# Patient Record
Sex: Male | Born: 1950 | Race: White | Hispanic: No | Marital: Married | Smoking: Never smoker
Health system: Southern US, Community
[De-identification: ages and names within clinical notes are randomized; demographics above are authoritative.]

## PROBLEM LIST (undated history)

## (undated) DIAGNOSIS — T50905A Adverse effect of unspecified drugs, medicaments and biological substances, initial encounter: Secondary | ICD-10-CM

## (undated) DIAGNOSIS — K219 Gastro-esophageal reflux disease without esophagitis: Secondary | ICD-10-CM

## (undated) DIAGNOSIS — R001 Bradycardia, unspecified: Secondary | ICD-10-CM

## (undated) DIAGNOSIS — M545 Low back pain, unspecified: Secondary | ICD-10-CM

## (undated) DIAGNOSIS — I6529 Occlusion and stenosis of unspecified carotid artery: Secondary | ICD-10-CM

## (undated) DIAGNOSIS — C44300 Unspecified malignant neoplasm of skin of unspecified part of face: Secondary | ICD-10-CM

## (undated) DIAGNOSIS — Z9289 Personal history of other medical treatment: Secondary | ICD-10-CM

## (undated) DIAGNOSIS — H9192 Unspecified hearing loss, left ear: Secondary | ICD-10-CM

## (undated) DIAGNOSIS — R600 Localized edema: Secondary | ICD-10-CM

## (undated) DIAGNOSIS — I2510 Atherosclerotic heart disease of native coronary artery without angina pectoris: Secondary | ICD-10-CM

## (undated) DIAGNOSIS — E785 Hyperlipidemia, unspecified: Secondary | ICD-10-CM

## (undated) DIAGNOSIS — D62 Acute posthemorrhagic anemia: Secondary | ICD-10-CM

## (undated) HISTORY — DX: Adverse effect of unspecified drugs, medicaments and biological substances, initial encounter: T50.905A

## (undated) HISTORY — PX: REFRACTIVE SURGERY: SHX103

## (undated) HISTORY — DX: Low back pain: M54.5

## (undated) HISTORY — DX: Gastro-esophageal reflux disease without esophagitis: K21.9

## (undated) HISTORY — DX: Bradycardia, unspecified: R00.1

## (undated) HISTORY — PX: CORONARY ANGIOPLASTY WITH STENT PLACEMENT: SHX49

## (undated) HISTORY — DX: Atherosclerotic heart disease of native coronary artery without angina pectoris: I25.10

## (undated) HISTORY — PX: TONSILLECTOMY AND ADENOIDECTOMY: SUR1326

## (undated) HISTORY — DX: Occlusion and stenosis of unspecified carotid artery: I65.29

## (undated) HISTORY — DX: Hyperlipidemia, unspecified: E78.5

## (undated) HISTORY — PX: KNEE ARTHROSCOPY: SHX127

## (undated) HISTORY — DX: Localized edema: R60.0

## (undated) HISTORY — PX: SKIN CANCER EXCISION: SHX779

## (undated) HISTORY — DX: Low back pain, unspecified: M54.50

## (undated) HISTORY — DX: Acute posthemorrhagic anemia: D62

---
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
## 1979-04-08 HISTORY — PX: KNEE ARTHROSCOPY: SHX127

## 1999-08-08 DIAGNOSIS — I2510 Atherosclerotic heart disease of native coronary artery without angina pectoris: Secondary | ICD-10-CM

## 1999-08-08 HISTORY — DX: Atherosclerotic heart disease of native coronary artery without angina pectoris: I25.10

## 2000-06-11 ENCOUNTER — Inpatient Hospital Stay (HOSPITAL_COMMUNITY): Admission: EM | Admit: 2000-06-11 | Discharge: 2000-06-13 | Payer: Self-pay | Admitting: Emergency Medicine

## 2001-11-01 ENCOUNTER — Encounter: Admission: RE | Admit: 2001-11-01 | Discharge: 2001-11-01 | Payer: Self-pay | Admitting: Family Medicine

## 2001-11-01 ENCOUNTER — Encounter: Payer: Self-pay | Admitting: Family Medicine

## 2003-06-26 ENCOUNTER — Encounter: Admission: RE | Admit: 2003-06-26 | Discharge: 2003-06-26 | Payer: Self-pay | Admitting: Family Medicine

## 2005-01-08 ENCOUNTER — Emergency Department (HOSPITAL_COMMUNITY): Admission: EM | Admit: 2005-01-08 | Discharge: 2005-01-08 | Payer: Self-pay | Admitting: Emergency Medicine

## 2006-12-14 ENCOUNTER — Encounter: Admission: RE | Admit: 2006-12-14 | Discharge: 2006-12-14 | Payer: Self-pay | Admitting: Neurosurgery

## 2010-12-23 NOTE — H&P (Signed)
[place]  Patient:    Michael Herrera, Michael Herrera                       MRN: [id number] Adm. Date:  [id number] Attending:  Armanda Magic CC:         Marinda Elk, M.D. with [facility]   History and Physical  CHIEF COMPLAINT:  Chest and teeth pain with a positive stress Cardiolite earlier today.  HISTORY OF PRESENT ILLNESS:  This is a 60 year old white male with no previous cardiac history who presented to the office today for an exercise Cardiolite to evaluate recent onset of substernal chest pain.  The chest pain is described as burning and pressure, with a mid sternal location with radiation to the jaw and teeth.  Initially the pain was with exertion only, but then, over the past couple of days, has become intermittent rest pain.  He denies any shortness of breath, nausea, vomiting, diaphoresis, presyncope, claudication, or edema.  Today, he had a markedly positive Cardiolite in the inferior region on stress images.  After being called at home to discuss the results, he stated that he was having chest pain at home, and was instructed to go to the emergency room.  On arrival in the ER, he was pain-free.  PAST MEDICAL HISTORY:  Significant for hyperlipidemia - he is currently not on any medications.  ALLERGIES:  He has no known drug allergies.  MEDICATIONS:  Include an aspirin a day; otherwise, no medications.  SOCIAL HISTORY:  He is married with two children.  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  He occasionally drinks alcohol.  Denies any tobacco use in years.  He exercises and walks six miles a day.  FAMILY HISTORY:  His father is alive in his 37s and in good health.  His mother died secondary to cancer.  There is coronary disease, hypertension, CVA, or hyperlipidemia in his family.  REVIEW OF SYSTEMS:  Positive for chest burning and teeth pain; otherwise, negative review of  systems.  PHYSICAL EXAMINATION:  VITAL SIGNS:  Blood pressure 136/89, heart rate 80, respirations 20, O2 saturation 100% on room air.  GENERAL:  This is a thin, well-developed white male in no acute distress, currently pain-free.  NECK:  Supple without lymphadenopathy.  No carotid bruits, +2 carotid upstrokes bilaterally.  LUNGS:  Clear to auscultation bilaterally.  HEART:  Regular rate and rhythm, no murmurs, rubs, or gallops.  Normal S1, S2.  ABDOMEN:  Soft, nontender, nondistended, good active bowel sounds.  No hepatosplenomegaly, no abdominal bruits, no masses.  EXTREMITIES:  No cyanosis, erythema, or edema.  There are +2 dorsalis pedis and posterior tibial pulses bilaterally.  LABORATORY DATA:  EKG shows normal sinus rhythm at a rate of 76 beats per minute.  There is nonspecific ST-T wave abnormality inferiorly.  Stress Cardiolite today showed positive inferior ischemia.  Chest x-ray is pending.  Sodium 143, potassium 3.8, chloride 105, bicarb 30, BUN 16, creatinine 1.2, glucose 98.  Hematocrit 46 and hemoglobin 16.  ASSESSMENT AND PLAN:  Unstable angina with positive Cardiolite for ischemia, currently pain-free with nonspecific ST-T wave changes inferiorly.  Plan is to admit to telemetry bed, rule out myocardial infarction with cardiac enzymes, IV heparin drip, IV nitroglycerin drip, Lopressor 25 mg p.o. q.6h., aspirin q.d., Plavix 75 mg a day.  Plan cardiac catheterization in the a.m.  Risks and benefits of the procedure were  explained, including allergic reaction to the contrast dye, bleeding, stroke, heart attack, and renal abnormalities, as well as death.  Patient understands, is in agreement to proceed with cardiac catheterization.  I will also check the recent results of a fasting lipid panel and start on antilipid therapy as indicated. DD:  06/11/00 TD:  06/12/00 Job: [id number]

## 2010-12-23 NOTE — Cardiovascular Report (Signed)
. [facility]  Patient:    Michael Herrera, Michael Herrera                       MRN: [id number] Proc. Date: 06/12/00 Adm. Date:  [id number] Attending:  Armanda Magic CC:         Armanda Magic, M.D.  Robert "Dewaine Oats, M.D., [facility]   Cardiac Catheterization  INDICATIONS:  Progressive angina over the past two weeks with a markedly positive stress Cardiolite and acute coronary/unstable anginal symptoms.  PROCEDURES PERFORMED:  Percutaneous transluminal coronary angioplasty and stent of the mid right coronary.  DESCRIPTION OF PROCEDURE:  After informed consent, we upgraded to a 7 French arterial sheath after the diagnostic procedure had been performed by Dr. Mayford Knife.  We then predilated with a 3.5 x 15 mm long Maverick balloon.  We then deployed an 18 mm long 3.5 mm NIR stent to 13 atmospheres.  We did not completely cover the proximal edge of the lesion and deployed a 9 mm 4.0 stent proximally/overlapping the proximal 3.5 stent.  We deployed this stent to 10 atmospheres.  We did not lose side branches.  Brisk antegrade flow was noted post-procedure.  The residual stenosis was felt to be less than 5%.  ACT postprocedure 294 seconds.  CONCLUSIONS:  Successful percutaneous coronary intervention on the right coronary for a subacute/acute coronary syndrome with reduction of the right coronary stenosis from 99% to 0% with brisk antegrade flow.  PLAN:  Integrilin x 12 to 18 hours, Plavix.  Further management per Dr. Armanda Magic DD:  06/12/00 TD:  06/12/00 Job: [id number]

## 2010-12-23 NOTE — Op Note (Signed)
Michael Herrera  Patient:    Michael Herrera, Michael Herrera                       MRN: [id number] Proc. Date: 06/12/00 Adm. Date:  [id number] Attending:  Armanda Magic                           Operative Report  PROCEDURE:  Left heart catheterization with coronary angiography and left ventriculography.  SURGEON:  Armanda Magic, M.D.  COMPLICATIONS:  NOne.  IV MEDICATIONS USED:  None.  IV CONTRAST USED:  150 cc.  INDICATIONS:  This is a 60 year old white male with no significant past medical history of coronary disease but with a history of hypercholesterolemia and borderline hypertension who presented to the emergency room with recurrent chest pain after having a positive cardiolite in the office.  He now presents for cardiac catheterization.  CONSENT:  I discussed the goals, risks and alternatives of the surgery at length with Michael Herrera.  These include but are not limited to death, stroke, myocardial infarction, cardiac perforation, contrast allergy, kidney damage, arterial damage, emergent need for coronary artery bypass grafting, bleeding requiring transfusion and infection.  The patient states his understanding and wishes to proceed.  TECHNIQUE:  Using conscious sedation local anesthesia and standard arterial technique the right femoral artery access was obtained using a 6 French arterial sheath.  A 6 French left Judkins catheter was advanced to the ascending aorta to a guided J wire and this and all subsequent manipulations performed using fluoroscopic observation.  Cineangiography of the left coronary artery was performed in multiple RAO and LAO projections.  A 6 French Judkins left catheter was then exchanged out for a 6 French right Judkins catheter.  Cineangiography to the right coronary artery was performed in RAO and LAO projections.  The 6 French right coronary catheter was then exchanged out for a 6 French angle pigtail catheter.  This was then  passed across the aortic valve into the left ventricular cavity.  Left ventriculography was then performed in the RAO and LAO projection.  At the completion of the procedure the angled pigtail was removed over a wire.  The patient subsequently went on to coronary intervention.  Total contrast used during the cardiac catheterization diagnostic portion was 150 cc.  RESULTS:  ANGIOGRAPHY:  The left main coronary artery was somewhat calcified but no significant disease noted.  It trifurcated into a large LAD, a very tiny intermediate branch, and a circumflex branch.  The proximal left anterior descending had a 40-50% stenosis that was actually diffusely diseased throughout its course.  There was diffuse 40-50% disease in the mid and distal LAD as well.   The first major diagonal branch off the LAD had a 80% stenosis at its proximal portion.  The second smaller diagonal appeared free of disease.  The septal perforator appeared free of disease as well.  The proximal left circumflex was free of disease.  At the takeoff of a large obtuse marginal branch there was a 50% stenosis distal to that.  There was also 40-50% diffuse stenosis in the distal left circumflex.  The large obtuse marginal had a 50-60% stenosis at its proximal portion.  This obtuse marginal is a rather large branch.  RIGHT CORONARY ARTERY:  The right coronary artery was a right dominant system with the posterior descending artery coming off the right coronary artery. The proximal right coronary artery showed  no significant disease at the bifurcation of the RV marginal branch.  There was a high grade 95% stenosis and distal to that there was diffuse disease up to 30-40%.  The distal right coronary artery then gave rise to a posterior descending artery with a proximal 40% stenosis and also posterolateral branch with some luminal irregularities.  LEFT VENTRICULOGRAPHY:  In the RAO projection it showed severe hypokinesis of the  inferior basal portion of the left ventricle as well as severe hypokalemia of a small portion of the posterior wall in the LAO projection.  Ejection fraction is estimated to be 45-50%.  IMPRESSION: 1. High grade mid right coronary artery lesion which appears to be the culprit    lesion based on his cardiolite study. 2. Diffuse nonobstructive coronary disease of the LAD and circumflex. 3. Mild left ventricular dysfunction.  PLAN: 1. PTCA and stenting of the high grade right coronary artery lesion. 2. aggressive lipid management for nonobstructive disease. 3. Start low dose Altace for borderline blood pressure elevation and LV    dysfunction.  Blood pressure in the emergency room on admission was in the    150s/90s range. 4. Aspirin q. day. 5. Add lipitor 20 mg a day.  His LDL in the office was 197 with a total    cholesterol of 274, HDL 36. 6. He will be discharged home in the morning when stable. DD:  06/12/00 TD:  06/12/00 Job: [id number]

## 2011-06-13 ENCOUNTER — Other Ambulatory Visit (HOSPITAL_BASED_OUTPATIENT_CLINIC_OR_DEPARTMENT_OTHER): Payer: Self-pay | Admitting: Family Medicine

## 2011-06-13 DIAGNOSIS — M954 Acquired deformity of chest and rib: Secondary | ICD-10-CM

## 2011-06-14 ENCOUNTER — Ambulatory Visit (HOSPITAL_BASED_OUTPATIENT_CLINIC_OR_DEPARTMENT_OTHER)
Admission: RE | Admit: 2011-06-14 | Discharge: 2011-06-14 | Disposition: A | Discharge status: Home / Self Care | Payer: 59 | Source: Ambulatory Visit | Attending: Family Medicine | Admitting: Family Medicine

## 2011-06-14 DIAGNOSIS — I2510 Atherosclerotic heart disease of native coronary artery without angina pectoris: Secondary | ICD-10-CM | POA: Insufficient documentation

## 2011-06-14 DIAGNOSIS — R222 Localized swelling, mass and lump, trunk: Secondary | ICD-10-CM | POA: Insufficient documentation

## 2011-06-14 DIAGNOSIS — M954 Acquired deformity of chest and rib: Secondary | ICD-10-CM | POA: Insufficient documentation

## 2013-05-07 ENCOUNTER — Encounter: Payer: Self-pay | Admitting: Cardiology

## 2013-05-13 ENCOUNTER — Encounter: Payer: Self-pay | Admitting: Cardiology

## 2013-05-13 DIAGNOSIS — I2510 Atherosclerotic heart disease of native coronary artery without angina pectoris: Secondary | ICD-10-CM | POA: Insufficient documentation

## 2013-05-13 DIAGNOSIS — E785 Hyperlipidemia, unspecified: Secondary | ICD-10-CM | POA: Insufficient documentation

## 2013-05-13 DIAGNOSIS — K219 Gastro-esophageal reflux disease without esophagitis: Secondary | ICD-10-CM | POA: Insufficient documentation

## 2013-05-14 ENCOUNTER — Ambulatory Visit (INDEPENDENT_AMBULATORY_CARE_PROVIDER_SITE_OTHER): Payer: 59 | Admitting: Cardiology

## 2013-05-14 ENCOUNTER — Ambulatory Visit: Payer: 59 | Admitting: Cardiology

## 2013-05-14 ENCOUNTER — Encounter: Payer: Self-pay | Admitting: Cardiology

## 2013-05-14 VITALS — BP 122/74 | HR 60 | Ht 69.0 in | Wt 141.1 lb

## 2013-05-14 DIAGNOSIS — I2510 Atherosclerotic heart disease of native coronary artery without angina pectoris: Secondary | ICD-10-CM

## 2013-05-14 DIAGNOSIS — K219 Gastro-esophageal reflux disease without esophagitis: Secondary | ICD-10-CM

## 2013-05-14 DIAGNOSIS — E785 Hyperlipidemia, unspecified: Secondary | ICD-10-CM

## 2013-05-14 LAB — LIPID PANEL
Cholesterol: 171 mg/dL (ref 0–200)
HDL: 52.6 mg/dL (ref >39.00)
LDL Cholesterol: 96 mg/dL (ref 0–99)
Total CHOL/HDL Ratio: 3
Triglycerides: 110 mg/dL (ref 0.0–149.0)

## 2013-05-14 MED ORDER — NITROGLYCERIN 0.4 MG SL SUBL: 0.4000 mg | SUBLINGUAL_TABLET | SUBLINGUAL | Status: DC | PRN

## 2013-05-14 MED ORDER — NIACIN-LOVASTATIN ER 1000-40 MG PO TB24: ORAL_TABLET | ORAL | Status: DC

## 2013-05-14 NOTE — Progress Notes (Signed)
  [address] [place], [place]  [address] Phone: [phone] Fax:  [phone]  Date:  05/14/2013   ID:  CLELL TRAHAN, DOB [birth year], MRN [id number]  PCP:  No primary provider on file.  Cardiologist:  Armanda Magic, MD    History of Present Illness: Michael Herrera is a 62 y.o. male who presents today for followup of her CAD/HTN and lipids.  He is doing well.  He denies any chest pain, SOB, DOE, LE edema, dizziness, palpitations or syncope.  He exercises daily by walking 2-3 miles daily.     Wt Readings from Last 3 Encounters:  05/14/13 141 lb 1.9 oz (64.012 kg)     Past Medical History  Diagnosis Date  . GERD (gastroesophageal reflux disease)   . Hypercholesteremia   . Low back pain   . Coronary atherosclerosis of native coronary artery 2001    s/p PCI of RCA    Current Outpatient Prescriptions  Medication Sig Dispense Refill  . aspirin 81 MG tablet Take 81 mg by mouth daily.      . Niacin-Lovastatin (ADVICOR) 1000-40 MG TB24 Take by mouth as directed.      . nitroGLYCERIN (NITROSTAT) 0.4 MG SL tablet Place 0.4 mg under the tongue every 5 (five) minutes as needed for chest pain.      . Omega-3 Fatty Acids (FISH OIL PO) Take 1,200 mg by mouth.      . vitamin C (ASCORBIC ACID) 500 MG tablet Take 500 mg by mouth daily.      . vitamin E 400 UNIT capsule Take 400 Units by mouth daily.       No current facility-administered medications for this visit.    Allergies:   No Known Allergies  Social History:  The patient  reports that he has never smoked. He does not have any smokeless tobacco history on file. He reports that he drinks alcohol. He reports that he does not use illicit drugs.   Family History:  The patient's family history includes Dementia in his father.   ROS:  Please see the history of present illness.      All other systems reviewed and negative.   PHYSICAL EXAM: VS:  BP 122/74  Pulse 60  Ht 5\' 9"  (1.753 m)  Wt 141 lb 1.9 oz (64.012 kg)  BMI  20.83 kg/m2 Well nourished, well developed, in no acute distress HEENT: normal Neck: no JVD Cardiac:  normal S1, S2; RRR; no murmur Lungs:  clear to auscultation bilaterally, no wheezing, rhonchi or rales Abd: soft, nontender, no hepatomegaly Ext: no edema Skin: warm and dry Neuro:  CNs 2-12 intact, no focal abnormalities noted  EKG:  NSR with no ST changes     ASSESSMENT AND PLAN:  1. CAD stable with no angina 2. HTN well controlled 3. Dyslipidemia at goal a year ago  - will check fasting lipids today  Follow up in 1 year  Signed, Armanda Magic, MD 05/14/2013 9:12 AM

## 2013-05-14 NOTE — Patient Instructions (Signed)
Please go to the lab today to have you Lipid panel done.  Your physician recommends that you schedule a follow-up appointment in: 1 year with Dr. Mayford Knife  Your physician recommends that you continue on your current medications as directed. Please refer to the Current Medication list given to you today.  Your refills have been sent in for you today.

## 2013-06-12 ENCOUNTER — Ambulatory Visit: Payer: 59 | Admitting: Cardiology

## 2013-10-21 ENCOUNTER — Telehealth: Payer: Self-pay | Admitting: Cardiology

## 2013-10-21 ENCOUNTER — Other Ambulatory Visit: Payer: Self-pay | Admitting: General Surgery

## 2013-10-21 DIAGNOSIS — R0789 Other chest pain: Secondary | ICD-10-CM

## 2013-10-21 NOTE — Telephone Encounter (Signed)
New Prob    Pt is calling to see if he is due for a stress test. States he is experiencing mild chest discomfort upon exertion. Please call.

## 2013-10-21 NOTE — Telephone Encounter (Signed)
Pt agrees with Treatment option and waiting on call to schedule from our office.

## 2013-10-21 NOTE — Telephone Encounter (Signed)
TO Dr Radford Pax. I do not see that pt has had one with [facility].  Please advise.

## 2013-10-21 NOTE — Telephone Encounter (Signed)
Please find out how often he is having CP and what he is doing when it occurs.[name]

## 2013-10-21 NOTE — Telephone Encounter (Signed)
Pt says he feels it only when he is exerting himself such as running up a hill. It is not that bad according to him. He has not needed his nitro. He said it is only felt for a second and then it goes away.   Per Dr Radford Pax, She wants him to have a stress myoview for chest discomfort.

## 2013-10-22 ENCOUNTER — Ambulatory Visit (HOSPITAL_COMMUNITY): Payer: 59 | Attending: Cardiovascular Disease | Admitting: Radiology

## 2013-10-22 ENCOUNTER — Encounter: Payer: Self-pay | Admitting: Cardiovascular Disease

## 2013-10-22 VITALS — BP 135/81 | Ht 69.0 in | Wt 150.0 lb

## 2013-10-22 DIAGNOSIS — I10 Essential (primary) hypertension: Secondary | ICD-10-CM | POA: Insufficient documentation

## 2013-10-22 DIAGNOSIS — I2510 Atherosclerotic heart disease of native coronary artery without angina pectoris: Secondary | ICD-10-CM

## 2013-10-22 DIAGNOSIS — R0789 Other chest pain: Secondary | ICD-10-CM

## 2013-10-22 DIAGNOSIS — R079 Chest pain, unspecified: Secondary | ICD-10-CM

## 2013-10-22 DIAGNOSIS — E785 Hyperlipidemia, unspecified: Secondary | ICD-10-CM | POA: Insufficient documentation

## 2013-10-22 MED ORDER — REGADENOSON 0.4 MG/5ML IV SOLN
0.4000 mg | Freq: Once | INTRAVENOUS | Status: AC
  Administered 2013-10-22: 0.4 mg via INTRAVENOUS

## 2013-10-22 MED ORDER — TECHNETIUM TC 99M SESTAMIBI GENERIC - CARDIOLITE
33.0000 | Freq: Once | INTRAVENOUS | Status: AC | PRN
  Administered 2013-10-22: 33 via INTRAVENOUS

## 2013-10-22 MED ORDER — TECHNETIUM TC 99M SESTAMIBI GENERIC - CARDIOLITE
10.8000 | Freq: Once | INTRAVENOUS | Status: AC | PRN
  Administered 2013-10-22: 11 via INTRAVENOUS

## 2013-10-22 NOTE — Progress Notes (Signed)
[facility] [place], [place] [address] [phone]    Cardiology Nuclear Med Study  Michael Herrera is a 63 y.o. male     MRN : [id number]     DOB: September 02, 1950  Procedure Date: 10/22/2013  Nuclear Med Background Indication for Stress Test:  Evaluation for Ischemia and Stent Patency History:  2005 Abnormal MPI: EF: 55% decrease in perfusion in the inferior wall, Stent RCA Cardiac Risk Factors: Hypertension and Lipids  Symptoms:  Chest Pain with Exertion   Nuclear Pre-Procedure Caffeine/Decaff Intake:  None > 12 hrs NPO After: 6:30pm   Lungs:  clear O2 Sat: 98% on room air. IV 0.9% NS with Angio Cath:  22g  IV Site: R Antecubital x 1, tolerated well IV Started by:  Irven Baltimore, RN  Chest Size (in):  38 Cup Size: n/a  Height: 5\' 9"  (1.753 m)  Weight:  150 lb (68.04 kg)  BMI:  Body mass index is 22.14 kg/(m^2). Tech Comments:  Patient took aleve at 0700 today for back pain.    Nuclear Med Study 1 or 2 day study: 1 day  Stress Test Type:  Treadmill/Lexiscan  Reading MD: N/A  Order Authorizing Provider:  Fransico Him, MD  Resting Radionuclide: Technetium 19m Sestamibi  Resting Radionuclide Dose: 11.0 mCi   Stress Radionuclide:  Technetium 11m Sestamibi  Stress Radionuclide Dose: 33.0 mCi           Stress Protocol Rest HR: 68 Stress HR: 115  Rest BP: 135/81 Stress BP: 155/80  Exercise Time (min): n/a METS: n/a   Predicted Max HR: 157 bpm % Max HR: 73.25 bpm Rate Pressure Product: 17825   Dose of Adenosine (mg):  n/a Dose of Lexiscan: 0.4 mg  Dose of Atropine (mg): n/a Dose of Dobutamine: n/a mcg/kg/min (at max HR)  Stress Test Technologist: Perrin Maltese, EMT-P  Nuclear Technologist:  Charlton Amor, CNMT     Rest Procedure:  Myocardial perfusion imaging was performed at rest 45 minutes following the intravenous administration of Technetium 57m Sestamibi. Rest ECG: NSR - Normal EKG  Stress Procedure:  The patient  received IV Lexiscan 0.4 mg over 15-seconds with concurrent low level exercise and then Technetium 42m Sestamibi was injected at 30-seconds while the patient continued walking one more minute. This patient had a headache with the Lexiscan injection. Quantitative spect images were obtained after a 45-minute delay. Stress ECG: No significant change from baseline ECG  QPS Raw Data Images:  There is interference from nuclear activity from structures below the diaphragm. This does not affect the ability to read the study. Stress Images:  Normal homogeneous uptake in all areas of the myocardium. Rest Images:  Normal homogeneous uptake in all areas of the myocardium. Subtraction (SDS):  No evidence of ischemia. Transient Ischemic Dilatation (Normal <1.22):  0.96 Lung/Heart Ratio (Normal <0.45):  0.35  Quantitative Gated Spect Images QGS EDV:  84 ml QGS ESV:  29 ml  Impression Exercise Capacity:  poor exercise capacity, back pain, changed to lexiscan BP Response:  Normal blood pressure response. Clinical Symptoms:  No significant symptoms noted. ECG Impression:  No significant ECG changes with Lexiscan. Comparison with Prior Nuclear Study: prior study in 2005 demonstrated inferior ischemia  Overall Impression:  Low risk stress nuclear study with high signal bowel overlying the inferior wall..  LV Ejection Fraction: 65%.  LV Wall Motion:  Normal Wall Motion  Pixie Casino, MD, [facility] Board Certified in Nuclear Cardiology Attending Cardiologist  [facility]

## 2013-10-23 NOTE — Telephone Encounter (Signed)
Follow up    Pt needs a call back from Dr Radford Pax only please he has some questions about last test.

## 2013-10-23 NOTE — Telephone Encounter (Signed)
New message    patient only wants to speak with Dr. Radford Pax    Was given nuclear stress test report . Does not fully understand his results.  Especially concern right artery.

## 2013-10-23 NOTE — Telephone Encounter (Signed)
I have spoken with Dr. Radford Pax & have called the patient. He is aware of why it was necessary for him to have a cath in 2001 after his cardiolite. Explained to pt that he had ST changes on his ekg during that time & this was why it was necessary for the heart cath.  Told pt there were not ST changes with the [facility]. Pt is not sure this was an adequate test for the "slight pain I have in my teeth". States he does not normally have chest pain but more so in his teeth with exertion.  Discussed taking NTG & pt does not want to take any NTG Pt is still not sure that his RCA is not blocked & not sure that he has a patent stent in his RCA  He would like Dr. Radford Pax to call him back tomorrow & talk with him.  I will forward this to Dr. Radford Pax to call pt back tomorrow. Horton Chin RN

## 2013-10-23 NOTE — Telephone Encounter (Signed)
Will forward to dr turner

## 2013-10-24 ENCOUNTER — Encounter: Payer: Self-pay | Admitting: General Surgery

## 2013-10-24 ENCOUNTER — Other Ambulatory Visit: Payer: Self-pay | Admitting: General Surgery

## 2013-10-24 DIAGNOSIS — R6884 Jaw pain: Secondary | ICD-10-CM

## 2013-10-24 NOTE — Telephone Encounter (Signed)
Discussed results of stress test with the patient.  He is not having any anginal CP like he had with his CAD in the past except for the jaw pain and teeth pain . His main complaint at this time is tooth and jaw pain which  comes on with exertion. His nuclear stress test showed overlying gut in the inferior wall but normal perfusion.  He continues to have symptoms with exertion so I have recommended that we proceed with cardiac cath.  Please schedule cath in next 2 weeks via radial artery approach

## 2013-10-24 NOTE — Telephone Encounter (Signed)
Pt is aware and is set up for L heart CATH radial approach on 10/29/13 w Dr Irish Lack  Labs set up for Monday 10/27/13  To Dr Radford Pax to make aware this was scheduled Also to amy to make aware for Dr Vs schedule

## 2013-10-27 ENCOUNTER — Encounter (HOSPITAL_COMMUNITY): Payer: Self-pay | Admitting: Pharmacy Technician

## 2013-10-27 ENCOUNTER — Encounter: Payer: Self-pay | Admitting: General Surgery

## 2013-10-27 ENCOUNTER — Other Ambulatory Visit (INDEPENDENT_AMBULATORY_CARE_PROVIDER_SITE_OTHER): Payer: 59

## 2013-10-27 DIAGNOSIS — R6884 Jaw pain: Secondary | ICD-10-CM

## 2013-10-27 LAB — CBC WITH DIFFERENTIAL/PLATELET
BASOS PCT: 1.5 % (ref 0.0–3.0)
Basophils Absolute: 0.1 10*3/uL (ref 0.0–0.1)
EOS PCT: 4 % (ref 0.0–5.0)
Eosinophils Absolute: 0.2 10*3/uL (ref 0.0–0.7)
HCT: 42.8 % (ref 39.0–52.0)
Hemoglobin: 14.5 g/dL (ref 13.0–17.0)
LYMPHS PCT: 30.2 % (ref 12.0–46.0)
Lymphs Abs: 1.5 10*3/uL (ref 0.7–4.0)
MCHC: 33.9 g/dL (ref 30.0–36.0)
MCV: 95.2 fl (ref 78.0–100.0)
MONO ABS: 0.4 10*3/uL (ref 0.1–1.0)
MONOS PCT: 7.9 % (ref 3.0–12.0)
NEUTROS PCT: 56.4 % (ref 43.0–77.0)
Neutro Abs: 2.7 10*3/uL (ref 1.4–7.7)
Platelets: 173 10*3/uL (ref 150.0–400.0)
RBC: 4.5 Mil/uL (ref 4.22–5.81)
RDW: 12.4 % (ref 11.5–14.6)
WBC: 4.8 10*3/uL (ref 4.5–10.5)

## 2013-10-27 LAB — BASIC METABOLIC PANEL
BUN: 17 mg/dL (ref 6–23)
CHLORIDE: 104 meq/L (ref 96–112)
CO2: 29 mEq/L (ref 19–32)
Calcium: 9.5 mg/dL (ref 8.4–10.5)
Creatinine, Ser: 0.9 mg/dL (ref 0.4–1.5)
GFR: 86.98 mL/min (ref >60.00)
Glucose, Bld: 111 mg/dL — ABNORMAL HIGH (ref 70–99)
POTASSIUM: 4.1 meq/L (ref 3.5–5.1)
Sodium: 139 mEq/L (ref 135–145)

## 2013-10-27 LAB — PROTIME-INR
INR: 1.1 ratio — ABNORMAL HIGH (ref 0.8–1.0)
Prothrombin Time: 11.3 s (ref 10.2–12.4)

## 2013-10-29 ENCOUNTER — Encounter (HOSPITAL_COMMUNITY)
Admission: RE | Disposition: A | Discharge status: Home / Self Care | Payer: Self-pay | Source: Ambulatory Visit | Attending: Interventional Cardiology

## 2013-10-29 ENCOUNTER — Encounter (HOSPITAL_COMMUNITY): Payer: Self-pay | Admitting: Interventional Cardiology

## 2013-10-29 ENCOUNTER — Ambulatory Visit (HOSPITAL_COMMUNITY)
Admission: RE | Admit: 2013-10-29 | Discharge: 2013-10-30 | Disposition: A | Discharge status: Home / Self Care | Payer: 59 | Source: Ambulatory Visit | Attending: Interventional Cardiology | Admitting: Interventional Cardiology

## 2013-10-29 DIAGNOSIS — I209 Angina pectoris, unspecified: Secondary | ICD-10-CM | POA: Diagnosis present

## 2013-10-29 DIAGNOSIS — I200 Unstable angina: Secondary | ICD-10-CM

## 2013-10-29 DIAGNOSIS — K219 Gastro-esophageal reflux disease without esophagitis: Secondary | ICD-10-CM | POA: Diagnosis present

## 2013-10-29 DIAGNOSIS — E785 Hyperlipidemia, unspecified: Secondary | ICD-10-CM | POA: Diagnosis present

## 2013-10-29 DIAGNOSIS — E7800 Pure hypercholesterolemia, unspecified: Secondary | ICD-10-CM | POA: Insufficient documentation

## 2013-10-29 DIAGNOSIS — I2510 Atherosclerotic heart disease of native coronary artery without angina pectoris: Secondary | ICD-10-CM | POA: Diagnosis present

## 2013-10-29 DIAGNOSIS — Z7982 Long term (current) use of aspirin: Secondary | ICD-10-CM | POA: Insufficient documentation

## 2013-10-29 HISTORY — DX: Personal history of other medical treatment: Z92.89

## 2013-10-29 HISTORY — DX: Unspecified malignant neoplasm of skin of unspecified part of face: C44.300

## 2013-10-29 HISTORY — PX: LEFT HEART CATHETERIZATION WITH CORONARY ANGIOGRAM: SHX5451

## 2013-10-29 LAB — POCT ACTIVATED CLOTTING TIME
ACTIVATED CLOTTING TIME: 265 s
Activated Clotting Time: 238 seconds

## 2013-10-29 LAB — PLATELET COUNT: Platelets: 152 10*3/uL (ref 150–400)

## 2013-10-29 SURGERY — LEFT HEART CATHETERIZATION WITH CORONARY ANGIOGRAM
Anesthesia: LOCAL

## 2013-10-29 MED ORDER — NITROGLYCERIN 0.2 MG/ML ON CALL CATH LAB
INTRAVENOUS | Status: AC
  Filled 2013-10-29: qty 1

## 2013-10-29 MED ORDER — CLOPIDOGREL BISULFATE 300 MG PO TABS
ORAL_TABLET | ORAL | Status: AC
  Filled 2013-10-29: qty 2

## 2013-10-29 MED ORDER — FENTANYL CITRATE 0.05 MG/ML IJ SOLN
INTRAMUSCULAR | Status: AC
  Filled 2013-10-29: qty 2

## 2013-10-29 MED ORDER — MIDAZOLAM HCL 2 MG/2ML IJ SOLN
INTRAMUSCULAR | Status: AC
  Filled 2013-10-29: qty 2

## 2013-10-29 MED ORDER — SODIUM CHLORIDE 0.9 % IJ SOLN: 3.0000 mL | INTRAMUSCULAR | Status: DC | PRN

## 2013-10-29 MED ORDER — SODIUM CHLORIDE 0.9 % IJ SOLN: 3.0000 mL | Freq: Two times a day (BID) | INTRAMUSCULAR | Status: DC

## 2013-10-29 MED ORDER — SODIUM CHLORIDE 0.9 % IV SOLN
1.0000 mL/kg/h | INTRAVENOUS | Status: AC
  Administered 2013-10-29 (×2): 1 mL/kg/h via INTRAVENOUS

## 2013-10-29 MED ORDER — SODIUM CHLORIDE 0.9 % IV SOLN
INTRAVENOUS | Status: DC
  Administered 2013-10-29: 09:00:00 via INTRAVENOUS

## 2013-10-29 MED ORDER — SIMVASTATIN 20 MG PO TABS
20.0000 mg | ORAL_TABLET | Freq: Every day | ORAL | Status: DC
  Filled 2013-10-29 (×2): qty 1

## 2013-10-29 MED ORDER — NIACIN ER 500 MG PO CPCR
1000.0000 mg | ORAL_CAPSULE | Freq: Every day | ORAL | Status: DC
  Filled 2013-10-29 (×2): qty 2

## 2013-10-29 MED ORDER — NIACIN-LOVASTATIN ER 1000-40 MG PO TB24: 1.0000 | ORAL_TABLET | Freq: Every day | ORAL | Status: DC

## 2013-10-29 MED ORDER — ASPIRIN 81 MG PO CHEW
81.0000 mg | CHEWABLE_TABLET | Freq: Every day | ORAL | Status: DC
  Filled 2013-10-29: qty 1

## 2013-10-29 MED ORDER — ATROPINE SULFATE 0.1 MG/ML IJ SOLN
INTRAMUSCULAR | Status: AC
  Filled 2013-10-29: qty 10

## 2013-10-29 MED ORDER — SODIUM CHLORIDE 0.9 % IV SOLN: 250.0000 mL | INTRAVENOUS | Status: DC | PRN

## 2013-10-29 MED ORDER — CLOPIDOGREL BISULFATE 75 MG PO TABS
75.0000 mg | ORAL_TABLET | Freq: Every day | ORAL | Status: DC
  Administered 2013-10-30: 75 mg via ORAL
  Filled 2013-10-29: qty 1

## 2013-10-29 MED ORDER — OMEGA-3-ACID ETHYL ESTERS 1 G PO CAPS
1.0000 g | ORAL_CAPSULE | Freq: Every day | ORAL | Status: DC
  Filled 2013-10-29 (×2): qty 1

## 2013-10-29 MED ORDER — HEPARIN SODIUM (PORCINE) 1000 UNIT/ML IJ SOLN
INTRAMUSCULAR | Status: AC
  Filled 2013-10-29: qty 1

## 2013-10-29 MED ORDER — TIROFIBAN HCL IV 5 MG/100ML
0.1500 ug/kg/min | INTRAVENOUS | Status: AC
  Administered 2013-10-29: 16:00:00 0.15 ug/kg/min via INTRAVENOUS
  Filled 2013-10-29: qty 100

## 2013-10-29 MED ORDER — VERAPAMIL HCL 2.5 MG/ML IV SOLN
INTRAVENOUS | Status: AC
  Filled 2013-10-29: qty 2

## 2013-10-29 MED ORDER — HEPARIN (PORCINE) IN NACL 2-0.9 UNIT/ML-% IJ SOLN
INTRAMUSCULAR | Status: AC
  Filled 2013-10-29: qty 1500

## 2013-10-29 MED ORDER — ASPIRIN 81 MG PO CHEW
81.0000 mg | CHEWABLE_TABLET | ORAL | Status: AC
  Administered 2013-10-29: 81 mg via ORAL
  Filled 2013-10-29: qty 1

## 2013-10-29 MED ORDER — NITROGLYCERIN 0.4 MG SL SUBL: 0.4000 mg | SUBLINGUAL_TABLET | SUBLINGUAL | Status: DC | PRN

## 2013-10-29 MED ORDER — LIDOCAINE HCL (PF) 1 % IJ SOLN
INTRAMUSCULAR | Status: AC
  Filled 2013-10-29: qty 30

## 2013-10-29 NOTE — CV Procedure (Signed)
PROCEDURE:  Left heart catheterization with selective coronary angiography, left ventriculogram.  PCI of LAD  INDICATIONS:  Refractory angina  The risks, benefits, and details of the procedure were explained to the patient.  The patient verbalized understanding and wanted to proceed.  Informed written consent was obtained.  PROCEDURE TECHNIQUE:  After Xylocaine anesthesia a 50F slender sheath was placed in the right radial artery with a single anterior needle wall stick.   IV heparin was given. Right coronary angiography was done using a Judkins R4 guide catheter.  Left coronary angiography was done using a Judkins L3.5 guide catheter.  Left ventriculography was done using a pigtail catheter.  A TR band was used for hemostasis.   CONTRAST:  Total of 210 cc.  COMPLICATIONS:  None.    HEMODYNAMICS:  Aortic pressure was 125/68; LV pressure was 122/8; LVEDP 11.  There was no gradient between the left ventricle and aorta.    ANGIOGRAPHIC DATA:   The left main coronary artery is patent.  There is significant calcium in the left main.  The left anterior descending artery is a large vessel which wraps around the apex. There is heavy calcification in the ostium of the vessel.  There is atherosclerosis starting in the proximal LAD and extending into the mid LAD. The tightest area is just before the first septal perforator and it is 90%. After the stenosis, there is an ectatic area. There several small to medium-size diagonals. The second diagonal has an ostial 70% stenosis. The mid to distal LAD has mild disease.  The left circumflex artery is a large vessel. There is heavy calcification in the ostium of the circumflex which causes a hazy appearance. The circumflex is large. There is a large first obtuse marginal with a 50% proximal lesion. Just after this large obtuse marginal. There is a 50% lesion in the mid circumflex.  The right coronary artery is a large, dominant vessel. The stents in the  mid to distal RCA are widely patent. There is mild, diffuse atherosclerosis in the RCA. Just before the bifurcation, there is moderate atherosclerosis. The posterior descending artery is large and patent. Posterior lateral artery is large and widely patent.  LEFT VENTRICULOGRAM:  Left ventricular angiogram was not done.  LVEDP was 11 mmHg.    PCI NARRATIVE: A CLS 3.0 guiding catheters using to engage the left main. IV heparin and IV tirofiban were used for anticoagulation. An ACT was used to check that anticoagulation was therapeutic. A pro-water wire was placed across the area disease in the LAD. A 2.5 x 20 balloon was used to predilate the area of disease.  A 3.0 x 32 Promus drug-eluting stent was advanced but would not cross the area disease. A BMW wire was then advanced as a buddy wire. The stent and did cross the area of disease. BMW wire was removed. The stent was deployed. The stent was then postdilated with a 3.25 x 15 noncompliant balloon. There was an excellent angiographic result.  Due to the haziness in the proximal circumflex, we considered performing intravascular ultrasound. A pro-water wire was placed into the circumflex system. The eye this catheter was advanced and made it into the proximal part of the vessel but would not fully go down into the circumflex due to poor guide support. The patient then developed ST segment elevation of bradycardia requiring 1 amp of atropine. He had chest discomfort. After about 2 minutes and intra-coronary nitroglycerin to treat vasospasm, his ST segments  came back down and his pain was relieved. A diagnostic right catheter was placed again to make sure that there is no problem with the right coronary artery. This was unrevealing. We abandoned our attempt at IVUS. It did not appear to be flow-limiting and there was calcification noted in the proximal circumflex on fluoroscopy. It was felt that he hazy appearance in the ostial circumflex is likely due to  calcification. This seems reasonable also given that his stress test did not show any ischemia in that territory.  IMPRESSIONS:  1. Calcified left main coronary artery without significant obstruction. 2. 90% proximal to mid left anterior descending artery stenosis. This was successfully treated with a 3.0 x 32 promus drug-eluting stent, postdilated to 3.3 mm in diameter. 3. Moderate disease in the mid left circumflex artery.  Moderate focal lesion in the first obtuse marginal. 4. Mild to moderate disease along with patent stents in the large dominant right coronary artery. 5. LVEDP 11 mmHg.    RECOMMENDATION:  He states that in 2001, he had a nervous feeling when he took Plavix. He did not have any signs of anaphylaxis. He did not have a rash. We discussed using either Prasugrel or clopidogrel.  He is willing to retry clopidogrel. I think this gives him the lowest bleeding risk.  Would like to try to clopidogrel since this was an elective angioplasty and not an ACS. Will continue tirofiban for 8 hours.

## 2013-10-29 NOTE — Interval H&P Note (Signed)
Cath Lab Visit (complete for each Cath Lab visit)  Clinical Evaluation Leading to the Procedure:   ACS: no  Non-ACS:    Anginal Classification: CCS III  Anti-ischemic medical therapy: No Therapy  Non-Invasive Test Results: Low-risk stress test findings: cardiac mortality <1%/year  Prior CABG: No previous CABG  Atypical angina in the past with his RCA stent.  Now again with tooth/jaw pain with walking short distances at times.    History and Physical Interval Note:  10/29/2013 10:32 AM  Michael Herrera  has presented today for surgery, with the diagnosis of cp  The various methods of treatment have been discussed with the patient and family. After consideration of risks, benefits and other options for treatment, the patient has consented to  Procedure(s): LEFT HEART CATHETERIZATION WITH CORONARY ANGIOGRAM (N/A) as a surgical intervention .  The patient's history has been reviewed, patient examined, no change in status, stable for surgery.  I have reviewed the patient's chart and labs.  Questions were answered to the patient's satisfaction.     Ardena Gangl S.

## 2013-10-29 NOTE — H&P (Signed)
Admit date: 10/29/2013 Referring Physician Dr. Radford Pax Primary Cardiologist Dr. Radford Pax Chief complaint/reason for admission: Cardiac cath  HPI: 63 y/o with risk factors for coronary artery disease who has had PCI of the RCA in the past. He has had recurrent chest discomfort. He had a low risk stress test several weeks ago but his symptoms persisted and he is referred for cardiac cath. Dr. Radford Pax has requested never used a radial approach.    PMH:    Past Medical History  Diagnosis Date  . GERD (gastroesophageal reflux disease)   . Hypercholesteremia   . Low back pain   . Coronary atherosclerosis of native coronary artery 2001    s/p PCI of RCA    PSH:    Past Surgical History  Procedure Laterality Date  . Cardiac catheterization    . Coronary angioplasty  2001    PCI of RCA    ALLERGIES:   Review of patient's allergies indicates no known allergies.  Prior to Admit Meds:   Prescriptions prior to admission  Medication Sig Dispense Refill  . aspirin 81 MG tablet Take 81 mg by mouth daily.      . diclofenac (VOLTAREN) 75 MG EC tablet Take 75 mg by mouth daily as needed.      . Niacin-Lovastatin 1000-40 MG TB24 Take 1 tablet by mouth daily. 1000/40 MG Once table daily.      . Omega-3 Fatty Acids (FISH OIL PO) Take 1,200 mg by mouth daily.       . vitamin C (ASCORBIC ACID) 500 MG tablet Take 500 mg by mouth daily.      . vitamin E 400 UNIT capsule Take 400 Units by mouth daily.      . nitroGLYCERIN (NITROSTAT) 0.4 MG SL tablet Place 0.4 mg under the tongue every 5 (five) minutes as needed for chest pain.       Family HX:    Family History  Problem Relation Age of Onset  . Dementia Father    Social HX:    History   Social History  . Marital Status: Married    Spouse Name: N/A    Number of Children: N/A  . Years of Education: N/A   Occupational History  . Not on file.   Social History Main Topics  . Smoking status: Never Smoker   . Smokeless tobacco: Not on file  .  Alcohol Use: Yes     Comment: 1 beer 3 times weekly  . Drug Use: No  . Sexual Activity: Not on file   Other Topics Concern  . Not on file   Social History Narrative  . No narrative on file     ROS:  All 11 ROS were addressed and are negative except what is stated in the HPI  PHYSICAL EXAM Filed Vitals:   10/29/13 0757  BP: 136/84  Pulse: 72  Temp: 98.2 F (36.8 C)  Resp: 18   General: Well developed, well nourished, in no acute distress Head: [name]   Normal cephalic and atramatic  Lungs:  Clear bilaterally to auscultation and percussion. Heart:   HRRR S1 S2   Neuro: Alert and oriented X 3. Psych:  Good affect, responds appropriately   Labs:   Lab Results  Component Value Date   WBC 4.8 10/27/2013   HGB 14.5 10/27/2013   HCT 42.8 10/27/2013   MCV 95.2 10/27/2013   PLT 173.0 10/27/2013    Recent Labs Lab 10/27/13 0802  NA 139  K 4.1  CL  104  CO2 29  BUN 17  CREATININE 0.9  CALCIUM 9.5  GLUCOSE 111*   No results found for this basename: CKTOTAL, CKMB, CKMBINDEX, TROPONINI   No results found for this basename: PTT   Lab Results  Component Value Date   INR 1.1* 10/27/2013     Lab Results  Component Value Date   CHOL 171 05/14/2013   Lab Results  Component Value Date   HDL 52.60 05/14/2013   Lab Results  Component Value Date   LDLCALC 96 05/14/2013   Lab Results  Component Value Date   TRIG 110.0 05/14/2013   Lab Results  Component Value Date   CHOLHDL 3 05/14/2013   No results found for this basename: LDLDIRECT      Radiology:  @[id number] @   ASSESSMENT: Persistent chest pain, history of CAD  PLAN:  Plan cardiac cath to evaluate coronary anatomy. Followup with Dr. Radford Pax. He'll continue to need aggressive secondary prevention regardless of the results of the cardiac cath.  Jettie Booze., MD  10/29/2013  9:54 AM

## 2013-10-30 ENCOUNTER — Encounter (HOSPITAL_COMMUNITY): Payer: Self-pay | Admitting: Physician Assistant

## 2013-10-30 DIAGNOSIS — I200 Unstable angina: Secondary | ICD-10-CM

## 2013-10-30 LAB — BASIC METABOLIC PANEL
BUN: 16 mg/dL (ref 6–23)
CALCIUM: 9.6 mg/dL (ref 8.4–10.5)
CO2: 27 mEq/L (ref 19–32)
Chloride: 104 mEq/L (ref 96–112)
Creatinine, Ser: 0.92 mg/dL (ref 0.50–1.35)
GFR, EST NON AFRICAN AMERICAN: 88 mL/min — AB (ref >90)
GLUCOSE: 87 mg/dL (ref 70–99)
POTASSIUM: 4.2 meq/L (ref 3.7–5.3)
SODIUM: 143 meq/L (ref 137–147)

## 2013-10-30 LAB — CBC
HCT: 39.2 % (ref 39.0–52.0)
HEMOGLOBIN: 13.8 g/dL (ref 13.0–17.0)
MCH: 32.5 pg (ref 26.0–34.0)
MCHC: 35.2 g/dL (ref 30.0–36.0)
MCV: 92.5 fL (ref 78.0–100.0)
PLATELETS: 165 10*3/uL (ref 150–400)
RBC: 4.24 MIL/uL (ref 4.22–5.81)
RDW: 12.1 % (ref 11.5–15.5)
WBC: 6 10*3/uL (ref 4.0–10.5)

## 2013-10-30 MED ORDER — CLOPIDOGREL BISULFATE 75 MG PO TABS: 75.0000 mg | ORAL_TABLET | Freq: Every day | ORAL | Status: DC

## 2013-10-30 NOTE — Discharge Instructions (Signed)

## 2013-10-30 NOTE — Progress Notes (Addendum)
Pt seen and examined. OK for d/c later today on aspirin and plavix along with statin.  F/u with Dr. Fransico Him.

## 2013-10-30 NOTE — Progress Notes (Signed)
CARDIAC REHAB PHASE I   Walking independently, feels much better, no teeth pain like he was having. Ed reviewed/completed. Not interested in CRPII as he exercises everyday. [id number]  Michael Herrera CES, ACSM 10/30/2013 8:33 AM

## 2013-10-30 NOTE — Discharge Summary (Signed)
Discharge Summary   Patient ID: Michael Herrera MRN: [id number], DOB/AGE: [birth year] 63 y.o. Admit date: 10/29/2013 D/C date:     10/30/2013  Primary Cardiologist: Dr. Radford Pax  Active Problems:   CAD (coronary artery disease)   GERD (gastroesophageal reflux disease)   Dyslipidemia   Other and unspecified angina pectoris   Unstable angina   Admission Dates: 10/29/13 - 10/30/13 Discharge Diagnosis: Unstable angina s/p  PCI with  3.0 x 32 Promus DES mLAD.   HPI: Michael Herrera is a 63 y.o. male with a history of CAD s/p remote PCI to RCA, HTN, HLD and GERD who presented on 10/29/13 for elective cardiac catheterization for recurrent chest discomfort/ exertional tooth and jaw pain.   Hospital Course He is followed by Dr. Radford Pax in the office and had been complaining of tooth and jaw pain which comes on with exertion. He underwent a nuclear stress test on 10/23/13 which returned low risk and showed overlying gut in the inferior wall but normal perfusion; LVEF: 65% and normal wall motion. He continued to have symptoms with exertion so cardiac cath was scheduled. He underwent cardiac catheterization on 10/29/13 which revealed a calcified left main coronary artery without significant obstruction, mod disease in the mLCx, moderate focal lesion in OM1 and mild to mod disease along with patent stents in the large dominant RCA. He had 90% proximal to mLAD artery stenosis. This was successfully treated with a 3.0 x 32 promus DES.   The patient was very hesitant to take medications and finally agreed to DAPT. He was placed on clopidogrel as this was an elective angioplasty and not an ACS. He will continue ASA/Plavix and his statin. He refused a BB but did agree to talk to Dr. Radford Pax about it as an outpatient, especially since his BP was elevated at the time of discharge.   The patient has had an uncomplicated hospital course and is recovering well. The radial catheter site is healing well. The patient has been  seen by Dr. Irish Lack today and deemed stable for discharge home. All follow-up appointments have been scheduled. A 30-day RX for Plavix was called into his [facility]. If "he likes and decides to continue" he will have Dr. Radford Pax call Plavix into his mail order pharmacy. He did not want it called in to that pharmacy at this time.     Discharge Vitals: Blood pressure 160/89, pulse 87, temperature 97.6 F (36.4 C), temperature source Oral, resp. rate 18, height 5\' 10"  (1.778 m), weight 151 lb 7.3 oz (68.7 kg), SpO2 96.00%.  Labs: Lab Results  Component Value Date   WBC 6.0 10/30/2013   HGB 13.8 10/30/2013   HCT 39.2 10/30/2013   MCV 92.5 10/30/2013   PLT 165 10/30/2013     Recent Labs Lab 10/30/13 0427  NA 143  K 4.2  CL 104  CO2 27  BUN 16  CREATININE 0.92  CALCIUM 9.6  GLUCOSE 87     Diagnostic Studies/Procedures   [facility] 10/29/13 PROCEDURE: Left heart catheterization with selective coronary angiography, left ventriculogram. PCI of LAD  INDICATIONS: Refractory angina  The risks, benefits, and details of the procedure were explained to the patient. The patient verbalized understanding and wanted to proceed. Informed written consent was obtained.  PROCEDURE TECHNIQUE: After Xylocaine anesthesia a 31F slender sheath was placed in the right radial artery with a single anterior needle wall stick. IV heparin was given. Right coronary angiography was done using a Judkins R4 guide catheter. Left  coronary angiography was done using a Judkins L3.5 guide catheter. Left ventriculography was done using a pigtail catheter. A TR band was used for hemostasis.  CONTRAST: Total of 210 cc.  COMPLICATIONS: None.  HEMODYNAMICS: Aortic pressure was 125/68; LV pressure was 122/8; LVEDP 11. There was no gradient between the left ventricle and aorta.  ANGIOGRAPHIC DATA: The left main coronary artery is patent. There is significant calcium in the left main.  The left anterior descending artery is a large  vessel which wraps around the apex. There is heavy calcification in the ostium of the vessel. There is atherosclerosis starting in the proximal LAD and extending into the mid LAD. The tightest area is just before the first septal perforator and it is 90%. After the stenosis, there is an ectatic area. There several small to medium-size diagonals. The second diagonal has an ostial 70% stenosis. The mid to distal LAD has mild disease.  The left circumflex artery is a large vessel. There is heavy calcification in the ostium of the circumflex which causes a hazy appearance. The circumflex is large. There is a large first obtuse marginal with a 50% proximal lesion. Just after this large obtuse marginal. There is a 50% lesion in the mid circumflex.  The right coronary artery is a large, dominant vessel. The stents in the mid to distal RCA are widely patent. There is mild, diffuse atherosclerosis in the RCA. Just before the bifurcation, there is moderate atherosclerosis. The posterior descending artery is large and patent. Posterior lateral artery is large and widely patent.  LEFT VENTRICULOGRAM: Left ventricular angiogram was not done. LVEDP was 11 mmHg.  PCI NARRATIVE: A CLS 3.0 guiding catheters using to engage the left main. IV heparin and IV tirofiban were used for anticoagulation. An ACT was used to check that anticoagulation was therapeutic. A pro-water wire was placed across the area disease in the LAD. A 2.5 x 20 balloon was used to predilate the area of disease. A 3.0 x 32 Promus drug-eluting stent was advanced but would not cross the area disease. A BMW wire was then advanced as a buddy wire. The stent and did cross the area of disease. BMW wire was removed. The stent was deployed. The stent was then postdilated with a 3.25 x 15 noncompliant balloon. There was an excellent angiographic result.  Due to the haziness in the proximal circumflex, we considered performing intravascular ultrasound. A pro-water wire  was placed into the circumflex system. The eye this catheter was advanced and made it into the proximal part of the vessel but would not fully go down into the circumflex due to poor guide support. The patient then developed ST segment elevation of bradycardia requiring 1 amp of atropine. He had chest discomfort. After about 2 minutes and intra-coronary nitroglycerin to treat vasospasm, his ST segments came back down and his pain was relieved. A diagnostic right catheter was placed again to make sure that there is no problem with the right coronary artery. This was unrevealing. We abandoned our attempt at IVUS. It did not appear to be flow-limiting and there was calcification noted in the proximal circumflex on fluoroscopy. It was felt that he hazy appearance in the ostial circumflex is likely due to calcification. This seems reasonable also given that his stress test did not show any ischemia in that territory.  IMPRESSIONS:  1. Calcified left main coronary artery without significant obstruction. 2. 90% proximal to mid left anterior descending artery stenosis. This was successfully treated with a 3.0  x 32 promus drug-eluting stent, postdilated to 3.3 mm in diameter. 3. Moderate disease in the mid left circumflex artery. Moderate focal lesion in the first obtuse marginal. 4. Mild to moderate disease along with patent stents in the large dominant right coronary artery. 5. LVEDP 11 mmHg.  RECOMMENDATION: He states that in 2001, he had a nervous feeling when he took Plavix. He did not have any signs of anaphylaxis. He did not have a rash. We discussed using either Prasugrel or clopidogrel. He is willing to retry clopidogrel. I think this gives him the lowest bleeding risk. Would like to try to clopidogrel since this was an elective angioplasty and not an ACS. Will continue tirofiban for 8 hours.  Cardiology Nuclear Med Study  Michael Herrera is a 63 y.o. male MRN : [id number] DOB: [birth year]  Procedure Date:  10/22/2013  Nuclear Med Background  Indication for Stress Test: Evaluation for Ischemia and Stent Patency  History: 2005 Abnormal MPI: EF: 55% decrease in perfusion in the inferior wall, Stent RCA  Cardiac Risk Factors: Hypertension and Lipids  Symptoms: Chest Pain with Exertion  Nuclear Pre-Procedure  Caffeine/Decaff Intake: None > 12 hrs  NPO After: 6:30pm   Lungs: clear  O2 Sat: 98% on room air.  IV 0.9% NS with Angio Cath: 22g   IV Site: R Antecubital x 1, tolerated well  IV Started by: Irven Baltimore, RN   Chest Size (in): 38  Cup Size: n/a   Height: 5\' 9"  (1.753 m)  Weight: 150 lb (68.04 kg)   BMI: Body mass index is 22.14 kg/(m^2).  Tech Comments: Patient took aleve at 0700 today for back pain.   Nuclear Med Study  1 or 2 day study: 1 day  Stress Test Type: Treadmill/Lexiscan   Reading MD: N/A  Order Authorizing Provider: Fransico Him, MD   Resting Radionuclide: Technetium 9m Sestamibi  Resting Radionuclide Dose: 11.0 mCi   Stress Radionuclide: Technetium 9m Sestamibi  Stress Radionuclide Dose: 33.0 mCi   Stress Protocol  Rest HR: 68  Stress HR: 115   Rest BP: 135/81  Stress BP: 155/80   Exercise Time (min): n/a  METS: n/a   Predicted Max HR: 157 bpm  % Max HR: 73.25 bpm  Rate Pressure Product: 17825  Dose of Adenosine (mg): n/a  Dose of Lexiscan: 0.4 mg   Dose of Atropine (mg): n/a  Dose of Dobutamine: n/a mcg/kg/min (at max HR)   Stress Test Technologist: Perrin Maltese, EMT-P  Nuclear Technologist: Charlton Amor, CNMT   Rest Procedure: Myocardial perfusion imaging was performed at rest 45 minutes following the intravenous administration of Technetium 80m Sestamibi.  Rest ECG: NSR - Normal EKG  Stress Procedure: The patient received IV Lexiscan 0.4 mg over 15-seconds with concurrent low level exercise and then Technetium 79m Sestamibi was injected at 30-seconds while the patient continued walking one more minute. This patient had a headache with the Lexiscan injection.  Quantitative spect images were obtained after a 45-minute delay.  Stress ECG: No significant change from baseline ECG  QPS  Raw Data Images: There is interference from nuclear activity from structures below the diaphragm. This does not affect the ability to read the study.  Stress Images: Normal homogeneous uptake in all areas of the myocardium.  Rest Images: Normal homogeneous uptake in all areas of the myocardium.  Subtraction (SDS): No evidence of ischemia.  Transient Ischemic Dilatation (Normal <1.22): 0.96  Lung/Heart Ratio (Normal <0.45): 0.35  Quantitative Gated Spect Images  QGS EDV: 84  ml  QGS ESV: 29 ml  Impression  Exercise Capacity: poor exercise capacity, back pain, changed to lexiscan  BP Response: Normal blood pressure response.  Clinical Symptoms: No significant symptoms noted.  ECG Impression: No significant ECG changes with Lexiscan.  Comparison with Prior Nuclear Study: prior study in 2005 demonstrated inferior ischemia  Overall Impression: Low risk stress nuclear study with high signal bowel overlying the inferior wall..  LV Ejection Fraction: 65%. LV Wall Motion: Normal Wall Motion     Discharge Medications     Medication List         aspirin 81 MG tablet  Take 81 mg by mouth daily.     clopidogrel 75 MG tablet  Commonly known as:  PLAVIX  Take 1 tablet (75 mg total) by mouth daily with breakfast.     diclofenac 75 MG EC tablet  Commonly known as:  VOLTAREN  Take 75 mg by mouth daily as needed.     FISH OIL PO  Take 1,200 mg by mouth daily.     Niacin-Lovastatin 1000-40 MG Tb24  Take 1 tablet by mouth daily. 1000/40 MG Once table daily.     nitroGLYCERIN 0.4 MG SL tablet  Commonly known as:  NITROSTAT  Place 0.4 mg under the tongue every 5 (five) minutes as needed for chest pain.     vitamin C 500 MG tablet  Commonly known as:  ASCORBIC ACID  Take 500 mg by mouth daily.     vitamin E 400 UNIT capsule  Take 400 Units by mouth daily.         Disposition   The patient will be discharged in stable condition to home.  Future Appointments Provider Department Dept Phone   11/13/2013 9:00 AM Sueanne Margarita, MD [facility] [phone]     Follow-up Information   Follow up with Sueanne Margarita, MD On 11/13/2013. (@ 9am)    Specialty:  Cardiology   Contact information:   [address] [place] [address] [phone]         Duration of Discharge Encounter: 40 minutes including physician and PA time, discussing meds and HTN.  Signed, Vertell Limber, KATHRYN PA-C 10/30/2013, 10:50 AM   I have examined the patient and reviewed assessment and plan and discussed with patient.  Agree with above as stated.  BP elevated.  He was hesitant to start any meds.  He should check BP outside of the doctor' soffice and bring these results to his appt with Dr. Radford Pax.  THey can decide whether he will take any additional antihypertensives.  We discussed starting low dose beta blocker but he did not want to do this, unless absolutely necessary.  He will need dual antiplatelet therapy for at least a year.  No problems with plavix overnight.  Thy Gullikson S.

## 2013-11-12 ENCOUNTER — Encounter: Payer: Self-pay | Admitting: General Surgery

## 2013-11-13 ENCOUNTER — Encounter: Payer: Self-pay | Admitting: Cardiology

## 2013-11-13 ENCOUNTER — Ambulatory Visit (INDEPENDENT_AMBULATORY_CARE_PROVIDER_SITE_OTHER): Payer: 59 | Admitting: Cardiology

## 2013-11-13 ENCOUNTER — Ambulatory Visit (INDEPENDENT_AMBULATORY_CARE_PROVIDER_SITE_OTHER): Payer: 59 | Admitting: Pharmacist

## 2013-11-13 VITALS — BP 128/70 | HR 74 | Ht 69.0 in | Wt 146.0 lb

## 2013-11-13 DIAGNOSIS — I2510 Atherosclerotic heart disease of native coronary artery without angina pectoris: Secondary | ICD-10-CM

## 2013-11-13 DIAGNOSIS — E785 Hyperlipidemia, unspecified: Secondary | ICD-10-CM

## 2013-11-13 MED ORDER — ATORVASTATIN CALCIUM 40 MG PO TABS: 40.0000 mg | ORAL_TABLET | Freq: Every day | ORAL | Status: DC

## 2013-11-13 MED ORDER — CLOPIDOGREL BISULFATE 75 MG PO TABS: 75.0000 mg | ORAL_TABLET | Freq: Every day | ORAL | Status: DC

## 2013-11-13 NOTE — Patient Instructions (Signed)
1.  Discontinue Advicor 1000/40 mg daily. 2.  Start generic lipitor 40 mg qd.  Will not use Crestor at this time as he prefers a generic statin.  If LDL elevated in future, will increase to 80 mg qd. 3.  Recheck lipid panel and hepatic panel in 8 weeks.  He was given a written order to check at [facility], and have results faxed to me, but explained if they won't check it, he will need to come to [place].  He understands.  Will call him results in 8 weeks.

## 2013-11-13 NOTE — Progress Notes (Signed)
Michael Herrera [place], [name]  [address] Phone: [phone] Fax:  [phone]  Date:  11/13/2013   ID:  Michael Herrera, DOB [birth year], MRN [id number]  PCP:  Abigail Miyamoto, MD  Cardiologist:  Fransico Him, MD     History of Present Illness: Michael Herrera is a 63 y.o. male with a history of ASCAD and dyslipidemia, who recently began to complain of tooth pain similar to his prior angina.  He underwent nuclear stress test which showed no ischemia.  Due to persistence of symptoms he underwent cardiac cath showing calcified LM with no stenosis, 90% proximal to mid LAD, moderate disease of the mid left circ with a focal 50% lesion in the OM and mid left circ and patent RCA with mild diffuse atherosclerosis in the RCA.  The stents in the mid to distal RCA were widely patent.  He underwent DES to the LAD.  He now presents back today for followup.  He is doing well.   He denies any anginal chest pain, SOB, DOE, LE edema, dizziness, palpitations or syncope.  He still has the tooth pain he had before the cath and PCI.  He has been having some problems with GERD and has started OTC Nexium.  He says that the pain in his teeth radiates across his mouth and is nonexertional and actually the tooth pain gets better when he exercise.  Tylenol makes the pain go away as well.     Wt Readings from Last 3 Encounters:  11/13/13 146 lb (66.225 kg)  10/29/13 151 lb 7.3 oz (68.7 kg)  10/29/13 151 lb 7.3 oz (68.7 kg)     Past Medical History  Diagnosis Date  . GERD (gastroesophageal reflux disease)   . Hypercholesteremia   . Low back pain   . Coronary atherosclerosis of native coronary artery 2001    a. s/p PCI with  3.0 x 32 Promus DES mLAD. Ca L main w/o sig obst, mod dz in mLCx. Moderate focal lesion in OM1. Mild to mod dz & patent stents in RCA  . Skin cancer of face   . History of blood transfusion 1956  . Dyslipidemia     Current Outpatient Prescriptions  Medication Sig Dispense Refill    . aspirin 81 MG tablet Take 81 mg by mouth daily.      . clopidogrel (PLAVIX) 75 MG tablet Take 1 tablet (75 mg total) by mouth daily with breakfast.  30 tablet  1  . diclofenac (VOLTAREN) 75 MG EC tablet Take 75 mg by mouth daily as needed.      . Niacin-Lovastatin 1000-40 MG TB24 Take 1 tablet by mouth daily. 1000/40 MG Once table daily.      . nitroGLYCERIN (NITROSTAT) 0.4 MG SL tablet Place 0.4 mg under the tongue every 5 (five) minutes as needed for chest pain.      . Omega-3 Fatty Acids (FISH OIL PO) Take 1,200 mg by mouth daily.       . vitamin C (ASCORBIC ACID) 500 MG tablet Take 500 mg by mouth daily.      . vitamin E 400 UNIT capsule Take 400 Units by mouth daily.       No current facility-administered medications for this visit.    Allergies:   No Known Allergies  Social History:  The patient  reports that he has never smoked. He has never used smokeless tobacco. He reports that he drinks about 1.8 ounces of alcohol per week.  He reports that he does not use illicit drugs.   Family History:  The patient's family history includes Dementia in his father.   ROS:  Please see the history of present illness.      All other systems reviewed and negative.   PHYSICAL EXAM: VS:  BP 128/70  Pulse 74  Ht 5\' 9"  (1.753 m)  Wt 146 lb (66.225 kg)  BMI 21.55 kg/m2 Well nourished, well developed, in no acute distress HEENT: normal Neck: no JVD Cardiac:  normal S1, S2; RRR; no murmur Lungs:  clear to auscultation bilaterally, no wheezing, rhonchi or rales Abd: soft, nontender, no hepatomegaly Ext: no edema Skin: warm and dry Neuro:  CNs 2-12 intact, no focal abnormalities noted       ASSESSMENT AND PLAN:  1. ASCAD with recent angina with cath showing patent RCA stents and 90% proximal to mid LAD lesion s/p DES stent - continue ASA/Plavix 2. Dyslipidemia - continue fish oil/Niacin/Lovastatin - His LDL is 96 so not at goal.  I will get him into lipid clinic for further  recommendations  Followup with me in 6 months  Signed, Fransico Him, MD 11/13/2013 8:39 AM

## 2013-11-13 NOTE — Assessment & Plan Note (Signed)
Will change Advicor to a more potent statin given elevated LDL.  I don't think niacin needs to be restarted at this time, but in 2 months may need to consider restarting this if TG/LDL/HDL worsens.  Continue fish oil as well. Plan: 1.  Discontinue Advicor 1000/40 mg daily. 2.  Start generic lipitor 40 mg qd.  Will not use Crestor at this time as he prefers a generic statin.  If LDL elevated in future, will increase to 80 mg qd. 3.  Recheck lipid panel and hepatic panel in 8 weeks.  He was given a written order to check at [facility], and have results faxed to me, but explained if they won't check it, he will need to come to [place].  He understands.  Will call him results in 8 weeks.

## 2013-11-13 NOTE — Patient Instructions (Signed)
Your physician recommends that you continue on your current medications as directed. Please refer to the Current Medication list given to you today.  You have been referred to [facility] with Michael Herrera today.   Your physician wants you to follow-up in: 6 months with Dr Mallie Snooks will receive a reminder letter in the mail two months in advance. If you don't receive a letter, please call our office to schedule the follow-up appointment.

## 2013-11-13 NOTE — Progress Notes (Signed)
Patient referred to lipid clinic by Dr. Radford Pax today given LDL ~ 95 mg/dL on Advicor 1000/40 mg and LDL goal of < 70 mg/dL.  Patient has been on [place] for 14 years since PCI x 2 in 2001.  Patient is very active and walks ~ 4 miles daily.  He already eats a low fat diet, and wants to know what else can be done to further lower cholesterol.  He has not tried other lipid lowering medications in the past.  Patient prefers only generic medications, which is likely reason he didn't change to SimCor in the past when this was recommended.  Patient wants to get future bloodwork drawn at [facility] if possible and have results sent to our office or me to manage.  Patient saw Dr. Radford Pax in office today, and wanted to get worked in to see me in office as well.  RF:  CAD (PCI x 2), age- LDL goal < 70, non-HDL goal < 100 Meds:  Advicor 1000/40 mg qhs  Labs: 05/2013:  TC 171, TG 110, HDL 53, LDL 96, ALT normal  - Advicor 1000/40 mg qhs  Current Outpatient Prescriptions  Medication Sig Dispense Refill  . aspirin 81 MG tablet Take 81 mg by mouth daily.      . clopidogrel (PLAVIX) 75 MG tablet Take 1 tablet (75 mg total) by mouth daily with breakfast.  90 tablet  3  . diclofenac (VOLTAREN) 75 MG EC tablet Take 75 mg by mouth daily as needed.      [name] esomeprazole (NEXIUM) 20 MG capsule Take 20 mg by mouth daily at 12 noon.      . Niacin-Lovastatin 1000-40 MG TB24 Take 1 tablet by mouth daily. 1000/40 MG Once table daily.      . nitroGLYCERIN (NITROSTAT) 0.4 MG SL tablet Place 0.4 mg under the tongue every 5 (five) minutes as needed for chest pain.      . Omega-3 Fatty Acids (FISH OIL PO) Take 1,200 mg by mouth daily.       . vitamin C (ASCORBIC ACID) 500 MG tablet Take 500 mg by mouth daily.      . vitamin E 400 UNIT capsule Take 400 Units by mouth daily.       No current facility-administered medications for this visit.   No Known Allergies  Family History  Problem Relation Age of Onset  . Dementia  Father

## 2014-01-20 ENCOUNTER — Other Ambulatory Visit (INDEPENDENT_AMBULATORY_CARE_PROVIDER_SITE_OTHER): Payer: 59

## 2014-01-20 ENCOUNTER — Other Ambulatory Visit: Payer: 59

## 2014-01-20 ENCOUNTER — Telehealth: Payer: Self-pay | Admitting: Interventional Cardiology

## 2014-01-20 ENCOUNTER — Other Ambulatory Visit: Payer: Self-pay

## 2014-01-20 DIAGNOSIS — E7800 Pure hypercholesterolemia, unspecified: Secondary | ICD-10-CM

## 2014-01-20 DIAGNOSIS — Z79899 Other long term (current) drug therapy: Secondary | ICD-10-CM

## 2014-01-20 LAB — HEPATIC FUNCTION PANEL
ALT: 33 U/L (ref 0–53)
AST: 28 U/L (ref 0–37)
Albumin: 4.9 g/dL (ref 3.5–5.2)
Alkaline Phosphatase: 82 U/L (ref 39–117)
BILIRUBIN DIRECT: 0.2 mg/dL (ref 0.0–0.3)
Total Bilirubin: 1.1 mg/dL (ref 0.2–1.2)
Total Protein: 7.4 g/dL (ref 6.0–8.3)

## 2014-01-20 LAB — LIPID PANEL
Cholesterol: 145 mg/dL (ref 0–200)
HDL: 45.3 mg/dL (ref >39.00)
LDL Cholesterol: 83 mg/dL (ref 0–99)
NonHDL: 99.7
Total CHOL/HDL Ratio: 3
Triglycerides: 83 mg/dL (ref 0.0–149.0)
VLDL: 16.6 mg/dL (ref 0.0–40.0)

## 2014-01-20 NOTE — Telephone Encounter (Signed)
"   Walk In Clients" Form Tyric Rolena Infante Left Message For Maye Hides to [employer]  6.16.15/km

## 2014-01-22 ENCOUNTER — Other Ambulatory Visit: Payer: Self-pay | Admitting: General Surgery

## 2014-01-22 DIAGNOSIS — E785 Hyperlipidemia, unspecified: Secondary | ICD-10-CM

## 2014-01-22 MED ORDER — ATORVASTATIN CALCIUM 80 MG PO TABS: 80.0000 mg | ORAL_TABLET | Freq: Every day | ORAL | Status: DC

## 2014-01-22 NOTE — Telephone Encounter (Signed)
Rx sent in and labs set up for pt.

## 2014-02-27 ENCOUNTER — Encounter: Payer: Self-pay | Admitting: Cardiology

## 2014-04-24 ENCOUNTER — Other Ambulatory Visit: Payer: 59

## 2014-05-15 ENCOUNTER — Ambulatory Visit (INDEPENDENT_AMBULATORY_CARE_PROVIDER_SITE_OTHER): Payer: 59 | Admitting: Cardiology

## 2014-05-15 ENCOUNTER — Other Ambulatory Visit (INDEPENDENT_AMBULATORY_CARE_PROVIDER_SITE_OTHER): Payer: 59 | Admitting: *Deleted

## 2014-05-15 VITALS — BP 124/82 | HR 70 | Ht 69.0 in | Wt 148.0 lb

## 2014-05-15 DIAGNOSIS — K219 Gastro-esophageal reflux disease without esophagitis: Secondary | ICD-10-CM

## 2014-05-15 DIAGNOSIS — I2510 Atherosclerotic heart disease of native coronary artery without angina pectoris: Secondary | ICD-10-CM

## 2014-05-15 DIAGNOSIS — E785 Hyperlipidemia, unspecified: Secondary | ICD-10-CM

## 2014-05-15 LAB — LIPID PANEL
CHOL/HDL RATIO: 4
Cholesterol: 147 mg/dL (ref 0–200)
HDL: 35.8 mg/dL — ABNORMAL LOW (ref >39.00)
LDL Cholesterol: 89 mg/dL (ref 0–99)
NONHDL: 111.2
TRIGLYCERIDES: 112 mg/dL (ref 0.0–149.0)
VLDL: 22.4 mg/dL (ref 0.0–40.0)

## 2014-05-15 LAB — ALT: ALT: 41 U/L (ref 0–53)

## 2014-05-15 MED ORDER — CLOPIDOGREL BISULFATE 75 MG PO TABS: 75.0000 mg | ORAL_TABLET | Freq: Every day | ORAL | Status: DC

## 2014-05-15 NOTE — Progress Notes (Signed)
[place], [place],   [address] Phone: [phone] Fax:  [phone]  Date:  05/15/2014   ID:  Michael Herrera, DOB [birth year], MRN [id number]  PCP:  Michael Miyamoto, MD  Cardiologist:  Michael Him, MD    History of Present Illness: Michael Herrera is a 63 y.o. male with a history of ASCAD and dyslipidemia, who recently began to complain of tooth pain similar to his prior angina. He underwent nuclear stress test which showed no ischemia. Due to persistence of symptoms he underwent cardiac cath showing calcified LM with no stenosis, 90% proximal to mid LAD, moderate disease of the mid left circ with a focal 50% lesion in the OM and mid left circ and patent RCA with mild diffuse atherosclerosis in the RCA. The stents in the mid to distal RCA were widely patent. He underwent DES to the LAD. He now presents back today for followup. He is doing well. He denies any anginal chest pain, SOB, DOE, LE edema, dizziness, palpitations or syncope. He exercises walking 3 miles daily without any problems.     Wt Readings from Last 3 Encounters:  05/15/14 148 lb (67.132 kg)  11/13/13 146 lb (66.225 kg)  10/29/13 151 lb 7.3 oz (68.7 kg)     Past Medical History  Diagnosis Date  . GERD (gastroesophageal reflux disease)   . Hypercholesteremia   . Low back pain   . Coronary atherosclerosis of native coronary artery 2001    a. s/p PCI with  3.0 x 32 Promus DES mLAD. Ca L main w/o sig obst, mod dz in mLCx. Moderate focal lesion in OM1. Mild to mod dz & patent stents in RCA  . Skin cancer of face   . History of blood transfusion 1956  . Dyslipidemia     Current Outpatient Prescriptions  Medication Sig Dispense Refill  . aspirin 81 MG tablet Take 81 mg by mouth daily.      [name] atorvastatin (LIPITOR) 80 MG tablet Take 1 tablet (80 mg total) by mouth daily.  90 tablet  3  . clopidogrel (PLAVIX) 75 MG tablet Take 1 tablet (75 mg total) by mouth daily with breakfast.  90 tablet  3  .  diclofenac (VOLTAREN) 75 MG EC tablet Take 75 mg by mouth daily as needed.      [name] esomeprazole (NEXIUM) 20 MG capsule Take 20 mg by mouth daily at 12 noon.      . nitroGLYCERIN (NITROSTAT) 0.4 MG SL tablet Place 0.4 mg under the tongue every 5 (five) minutes as needed for chest pain.      . Omega-3 Fatty Acids (FISH OIL PO) Take 1,200 mg by mouth daily.       . vitamin C (ASCORBIC ACID) 500 MG tablet Take 500 mg by mouth daily.      . vitamin E 400 UNIT capsule Take 400 Units by mouth daily.       No current facility-administered medications for this visit.    Allergies:   No Known Allergies  Social History:  The patient  reports that he has never smoked. He has never used smokeless tobacco. He reports that he drinks about 1.8 ounces of alcohol per week. He reports that he does not use illicit drugs.   Family History:  The patient's family history includes Dementia in his father.   ROS:  Please see the history of present illness.      All other systems reviewed and negative.  PHYSICAL EXAM: VS:  BP 124/82  Pulse 70  Ht 5\' 9"  (1.753 m)  Wt 148 lb (67.132 kg)  BMI 21.85 kg/m2 Well nourished, well developed, in no acute distress HEENT: normal Neck: no JVD Cardiac:  normal S1, S2; RRR; no murmur Lungs:  clear to auscultation bilaterally, no wheezing, rhonchi or rales Abd: soft, nontender, no hepatomegaly Ext: no edema Skin: warm and dry Neuro:  CNs 2-12 intact, no focal abnormalities noted  ASSESSMENT AND PLAN:  1. ASCAD with recent angina with cath showing patent RCA stents and 90% proximal to mid LAD lesion s/p DES stent - continue ASA/Plavix  - He will stop his Plavix in March since it will have been a year 2. Dyslipidemia - continue fish oil/Atorvastatin - check FLP and ALT today   Followup with me in 6 months     Signed, Michael Him, MD [facility] 05/15/2014 9:09 AM

## 2014-05-15 NOTE — Patient Instructions (Signed)
Your physician recommends that you return for a FASTING lipid and alt.   Your physician has recommended you make the following change in your medication:   1. You can stop Plavix in March of 2016.   Your physician wants you to follow-up in: 6 months with Dr. Radford Pax. You will receive a reminder letter in the mail two months in advance. If you don't receive a letter, please call our office to schedule the follow-up appointment.

## 2014-05-26 ENCOUNTER — Telehealth: Payer: Self-pay | Admitting: Cardiology

## 2014-05-26 NOTE — Telephone Encounter (Signed)
New message ° ° ° ° ° °Returning a nurses call °

## 2014-05-26 NOTE — Telephone Encounter (Signed)
Informed patient about Dr. Theodosia Blender recommendation to refer him to the [facility]. Pt st that he is busy now and will call back tomorrow, and not to refer him until speaking with him.

## 2014-05-26 NOTE — Telephone Encounter (Signed)
Please refer him to lipid clinic

## 2014-05-26 NOTE — Telephone Encounter (Signed)
LMTCB

## 2014-05-26 NOTE — Telephone Encounter (Signed)
Patient informed of Dr. Theodosia Blender new instructions to add 10mg  Zetia daily.  Patient requests to call insurance to talk about coverage options before calling the prescription in.  [name] to call the office to F/U after speaking with insurance.

## 2014-05-26 NOTE — Telephone Encounter (Signed)
Pt called insurance company and Zetia is too expensive, pt st he will not purchase it. Michael Herrera st he will "get really strict with his diet and exercise again." Informed patient that I will call him back if Dr. Radford Pax has any other suggestions or orders.   Notes Recorded by Sueanne Margarita, MD on 05/15/2014 at 2:39 PM LDL still not at goal - add Zetia 10mg  daily and recheck FLP and ALT in 8 weeks

## 2014-05-26 NOTE — Telephone Encounter (Signed)
Follow up  ° ° ° °Returning call back to nurse  °

## 2014-05-29 NOTE — Telephone Encounter (Signed)
Called Mr. Getman to F/U. Pt st he does not want to be referred to [facility], that he has changed his diet and "amped up his exercise schedule." Pt st that he will have his labs done in 6 months when he comes back for OV with Dr. Radford Pax.

## 2014-07-07 DIAGNOSIS — I6529 Occlusion and stenosis of unspecified carotid artery: Secondary | ICD-10-CM

## 2014-07-07 HISTORY — DX: Occlusion and stenosis of unspecified carotid artery: I65.29

## 2014-07-16 ENCOUNTER — Encounter (HOSPITAL_COMMUNITY): Payer: Self-pay | Admitting: Interventional Cardiology

## 2014-08-10 ENCOUNTER — Telehealth: Payer: Self-pay | Admitting: Cardiology

## 2014-08-10 NOTE — Telephone Encounter (Signed)
Appointment with Dr. Ashok Norris 08/11/2014 at 2:30 pm. Patient aware. He is also advised that if chest pain worsens or is unrelieved by Nitro(3), That he should go to the ED.

## 2014-08-10 NOTE — Telephone Encounter (Signed)
New message     Pt c/o of Chest Pain: 1. Are you having CP right now? no 2. Are you experiencing any other symptoms (ex. SOB, nausea, vomiting, sweating)?  Pain in his back sometimes---pt also has acid reflux and takes medication for it 3. How long have you been experiencing CP? 1 week 4. Is your CP continuous or coming and going?  Comes and goes-----worse when he exercises 5. Have you taken Nitroglycerin? Took a nitro yesterday----first time ever.  It did seem to help a little

## 2014-08-10 NOTE — Telephone Encounter (Signed)
Patient reports he's been having chest pain for approx 1 week. This occurs at rest and with exertion. No radiation. He does have reflux and back pain. Started back  On Nexium.  Took 1 Nitro yesterday with relief. i offered him an appointment today with flex provider, but he declined. Only wants to see Dr. Radford Pax.

## 2014-08-11 ENCOUNTER — Ambulatory Visit (INDEPENDENT_AMBULATORY_CARE_PROVIDER_SITE_OTHER): Payer: 59 | Admitting: Cardiology

## 2014-08-11 ENCOUNTER — Encounter: Payer: Self-pay | Admitting: Cardiology

## 2014-08-11 VITALS — BP 144/94 | HR 65 | Ht 69.0 in | Wt 148.0 lb

## 2014-08-11 DIAGNOSIS — I2583 Coronary atherosclerosis due to lipid rich plaque: Principal | ICD-10-CM

## 2014-08-11 DIAGNOSIS — Z01812 Encounter for preprocedural laboratory examination: Secondary | ICD-10-CM

## 2014-08-11 DIAGNOSIS — R079 Chest pain, unspecified: Secondary | ICD-10-CM

## 2014-08-11 DIAGNOSIS — E785 Hyperlipidemia, unspecified: Secondary | ICD-10-CM

## 2014-08-11 DIAGNOSIS — I2510 Atherosclerotic heart disease of native coronary artery without angina pectoris: Secondary | ICD-10-CM

## 2014-08-11 LAB — CBC WITH DIFFERENTIAL/PLATELET
BASOS ABS: 0 10*3/uL (ref 0.0–0.1)
BASOS PCT: 0.6 % (ref 0.0–3.0)
EOS ABS: 0.2 10*3/uL (ref 0.0–0.7)
Eosinophils Relative: 2.4 % (ref 0.0–5.0)
HCT: 45.9 % (ref 39.0–52.0)
HEMOGLOBIN: 15.1 g/dL (ref 13.0–17.0)
LYMPHS PCT: 18.9 % (ref 12.0–46.0)
Lymphs Abs: 1.5 10*3/uL (ref 0.7–4.0)
MCHC: 32.8 g/dL (ref 30.0–36.0)
MCV: 92.8 fl (ref 78.0–100.0)
MONO ABS: 0.6 10*3/uL (ref 0.1–1.0)
Monocytes Relative: 8.1 % (ref 3.0–12.0)
NEUTROS ABS: 5.5 10*3/uL (ref 1.4–7.7)
Neutrophils Relative %: 70 % (ref 43.0–77.0)
Platelets: 219 10*3/uL (ref 150.0–400.0)
RBC: 4.95 Mil/uL (ref 4.22–5.81)
RDW: 12.7 % (ref 11.5–15.5)
WBC: 7.8 10*3/uL (ref 4.0–10.5)

## 2014-08-11 LAB — BASIC METABOLIC PANEL
BUN: 20 mg/dL (ref 6–23)
CALCIUM: 9.7 mg/dL (ref 8.4–10.5)
CO2: 29 mEq/L (ref 19–32)
Chloride: 103 mEq/L (ref 96–112)
Creatinine, Ser: 0.9 mg/dL (ref 0.4–1.5)
GFR: 87.85 mL/min (ref >60.00)
GLUCOSE: 85 mg/dL (ref 70–99)
Potassium: 4.1 mEq/L (ref 3.5–5.1)
SODIUM: 139 meq/L (ref 135–145)

## 2014-08-11 LAB — PROTIME-INR
INR: 1.1 ratio — ABNORMAL HIGH (ref 0.8–1.0)
Prothrombin Time: 11.8 s (ref 9.6–13.1)

## 2014-08-11 NOTE — Patient Instructions (Addendum)
Your physician has requested that you have a cardiac catheterization. Cardiac catheterization is used to diagnose and/or treat various heart conditions. Doctors may recommend this procedure for a number of different reasons. The most common reason is to evaluate chest pain. Chest pain can be a symptom of coronary artery disease (CAD), and cardiac catheterization can show whether plaque is narrowing or blocking your heart's arteries. This procedure is also used to evaluate the valves, as well as measure the blood flow and oxygen levels in different parts of your heart. For further information please visit [email or web address]. Please follow instruction sheet, as given.  Your physician recommends that you have lab work TODAY (BMET, CBC, PT)

## 2014-08-11 NOTE — Progress Notes (Signed)
[place], [place], [place]  [address] Phone: [phone] Fax:  [phone]  Date:  08/11/2014   ID:  Michael Herrera, DOB 06/01/1951, MRN [id number]  PCP:  Abigail Miyamoto, MD  Cardiologist:  Fransico Him, MD    History of Present Illness: Michael Herrera is a 64 y.o. male with a history of ASCAD and dyslipidemia, who recently began to complain of tooth pain similar to his prior angina. He underwent nuclear stress test which showed no ischemia. Due to persistence of symptoms he underwent cardiac cath showing calcified LM with no stenosis, 90% proximal to mid LAD, moderate disease of the mid left circ with a focal 50% lesion in the OM and mid left circ and patent RCA with mild diffuse atherosclerosis in the RCA. The stents in the mid to distal RCA were widely patent. He underwent DES to the LAD.  He did well after PCI but he now presents back today for evaluation of chest pain.  He says that it is a dull pain that seems to come on when he exerts himself. The pain will radiate to his teeth and to his back.  He is getting it on a daily basis.  He has no rest pain, only exertional and it is not associated with SOB.  Wt Readings from Last 3 Encounters:  08/11/14 148 lb (67.132 kg)  05/15/14 148 lb (67.132 kg)  11/13/13 146 lb (66.225 kg)     Past Medical History  Diagnosis Date  . GERD (gastroesophageal reflux disease)   . Hypercholesteremia   . Low back pain   . Coronary atherosclerosis of native coronary artery 2001    a. s/p PCI with  3.0 x 32 Promus DES mLAD. Ca L main w/o sig obst, mod dz in mLCx. Moderate focal lesion in OM1. Mild to mod dz & patent stents in RCA  . Skin cancer of face   . History of blood transfusion 1956  . Dyslipidemia     Current Outpatient Prescriptions  Medication Sig Dispense Refill  . aspirin 81 MG tablet Take 81 mg by mouth daily.    [name] atorvastatin (LIPITOR) 80 MG tablet Take 1 tablet (80 mg total) by mouth daily. 90 tablet 3  . clopidogrel  (PLAVIX) 75 MG tablet Take 1 tablet (75 mg total) by mouth daily with breakfast. STOP MARCH 2016 90 tablet 3  . diclofenac (VOLTAREN) 75 MG EC tablet Take 75 mg by mouth daily as needed.    [name] esomeprazole (NEXIUM) 20 MG capsule Take 20 mg by mouth daily at 12 noon.    . nitroGLYCERIN (NITROSTAT) 0.4 MG SL tablet Place 0.4 mg under the tongue every 5 (five) minutes as needed for chest pain.    . Omega-3 Fatty Acids (FISH OIL PO) Take 1,200 mg by mouth daily.     . vitamin C (ASCORBIC ACID) 500 MG tablet Take 500 mg by mouth daily.    . vitamin E 400 UNIT capsule Take 400 Units by mouth daily.     No current facility-administered medications for this visit.    Allergies:   No Known Allergies  Social History:  The patient  reports that he has never smoked. He has never used smokeless tobacco. He reports that he drinks about 1.8 oz of alcohol per week. He reports that he does not use illicit drugs.   Family History:  The patient's family history includes Dementia in his father.   ROS:  Please see the  history of present illness.      All other systems reviewed and negative.   PHYSICAL EXAM: VS:  BP 144/94 mmHg  Pulse 65  Ht 5\' 9"  (1.753 m)  Wt 148 lb (67.132 kg)  BMI 21.85 kg/m2 Well nourished, well developed, in no acute distress HEENT: normal Neck: no JVD Cardiac:  normal S1, S2; RRR; no murmur Lungs:  clear to auscultation bilaterally, no wheezing, rhonchi or rales Abd: soft, nontender, no hepatomegaly Ext: no edema Skin: warm and dry Neuro:  CNs 2-12 intact, no focal abnormalities noted  EKG:  NSR with sinus arrhythmias     ASSESSMENT AND PLAN:  1. ASCAD with recent angina with cath showing patent RCA stents and 90% proximal to mid LAD lesion s/p DES stent.   - continue ASA/Plavix  2. Dyslipidemia - last LDL was not at goal but he did not want to uptitrate his statin and wanted to try diet - continue fish oil/Atorvastatin.  He is agreeable to adding Zetia 10mg  daily but we  will check with his insurance to see how much his copay will be.       3.  Chest pain concerning for exertional angina.  This pain also radiates into his jaw and back and does improve with SL NTG.  His angina in the past has always involved jaw and teeth pain.  His last nuclear stress test was normal in the setting of obstructive ASCAD so I do not think that proceeding with repeat stress test would have much yield.  He is less than a year out from PCI so I have recommended that we proceed with cardiac cath to reevaluate his coronary anatomy.  Cardiac catheterization was discussed with the patient fully including risks of myocardial infarction, death, stroke, bleeding, arrhythmia, dye allergy, renal insufficiency or bleeding.  All patient questions and concerns were discussed and the patient understands and is willing to proceed.    Followup with me after cath  Signed, Fransico Him, MD [facility] 08/11/2014 2:38 PM

## 2014-08-12 ENCOUNTER — Telehealth: Payer: Self-pay | Admitting: Cardiology

## 2014-08-12 MED ORDER — EZETIMIBE 10 MG PO TABS: 10.0000 mg | ORAL_TABLET | Freq: Every day | ORAL | Status: DC

## 2014-08-12 NOTE — Telephone Encounter (Signed)
Patient agreeable to start Zetia 10 mg daily. Rx sent to [facility] per patient request.

## 2014-08-12 NOTE — Telephone Encounter (Signed)
New Msg          Pt calling in regards to a new medication. Please return call.

## 2014-08-14 ENCOUNTER — Encounter (HOSPITAL_COMMUNITY): Payer: Self-pay | Admitting: *Deleted

## 2014-08-14 ENCOUNTER — Encounter (HOSPITAL_COMMUNITY)
Admission: RE | Disposition: A | Discharge status: Home / Self Care | Payer: Self-pay | Source: Ambulatory Visit | Attending: Thoracic Surgery (Cardiothoracic Vascular Surgery)

## 2014-08-14 ENCOUNTER — Other Ambulatory Visit: Payer: Self-pay | Admitting: *Deleted

## 2014-08-14 ENCOUNTER — Inpatient Hospital Stay (HOSPITAL_COMMUNITY)
Admission: RE | Admit: 2014-08-14 | Discharge: 2014-08-22 | DRG: 234 | Disposition: A | Discharge status: Home / Self Care | Payer: 59 | Source: Ambulatory Visit | Attending: Thoracic Surgery (Cardiothoracic Vascular Surgery) | Admitting: Thoracic Surgery (Cardiothoracic Vascular Surgery)

## 2014-08-14 DIAGNOSIS — J9811 Atelectasis: Secondary | ICD-10-CM | POA: Diagnosis not present

## 2014-08-14 DIAGNOSIS — E876 Hypokalemia: Secondary | ICD-10-CM | POA: Diagnosis not present

## 2014-08-14 DIAGNOSIS — E8770 Fluid overload, unspecified: Secondary | ICD-10-CM | POA: Diagnosis not present

## 2014-08-14 DIAGNOSIS — E780 Pure hypercholesterolemia: Secondary | ICD-10-CM | POA: Diagnosis present

## 2014-08-14 DIAGNOSIS — I25118 Atherosclerotic heart disease of native coronary artery with other forms of angina pectoris: Secondary | ICD-10-CM | POA: Diagnosis present

## 2014-08-14 DIAGNOSIS — I25110 Atherosclerotic heart disease of native coronary artery with unstable angina pectoris: Secondary | ICD-10-CM

## 2014-08-14 DIAGNOSIS — D696 Thrombocytopenia, unspecified: Secondary | ICD-10-CM | POA: Diagnosis not present

## 2014-08-14 DIAGNOSIS — I25119 Atherosclerotic heart disease of native coronary artery with unspecified angina pectoris: Secondary | ICD-10-CM | POA: Diagnosis present

## 2014-08-14 DIAGNOSIS — I2510 Atherosclerotic heart disease of native coronary artery without angina pectoris: Secondary | ICD-10-CM

## 2014-08-14 DIAGNOSIS — D62 Acute posthemorrhagic anemia: Secondary | ICD-10-CM | POA: Diagnosis not present

## 2014-08-14 DIAGNOSIS — I209 Angina pectoris, unspecified: Secondary | ICD-10-CM | POA: Insufficient documentation

## 2014-08-14 DIAGNOSIS — Z7902 Long term (current) use of antithrombotics/antiplatelets: Secondary | ICD-10-CM

## 2014-08-14 DIAGNOSIS — Z7982 Long term (current) use of aspirin: Secondary | ICD-10-CM

## 2014-08-14 DIAGNOSIS — Z955 Presence of coronary angioplasty implant and graft: Secondary | ICD-10-CM | POA: Diagnosis not present

## 2014-08-14 DIAGNOSIS — I959 Hypotension, unspecified: Secondary | ICD-10-CM | POA: Diagnosis not present

## 2014-08-14 DIAGNOSIS — F419 Anxiety disorder, unspecified: Secondary | ICD-10-CM | POA: Diagnosis not present

## 2014-08-14 DIAGNOSIS — K219 Gastro-esophageal reflux disease without esophagitis: Secondary | ICD-10-CM | POA: Diagnosis present

## 2014-08-14 DIAGNOSIS — Z951 Presence of aortocoronary bypass graft: Secondary | ICD-10-CM

## 2014-08-14 DIAGNOSIS — I2584 Coronary atherosclerosis due to calcified coronary lesion: Secondary | ICD-10-CM | POA: Diagnosis present

## 2014-08-14 HISTORY — PX: LEFT HEART CATHETERIZATION WITH CORONARY ANGIOGRAM: SHX5451

## 2014-08-14 LAB — CBC
HCT: 41.5 % (ref 39.0–52.0)
Hemoglobin: 14.3 g/dL (ref 13.0–17.0)
MCH: 30.4 pg (ref 26.0–34.0)
MCHC: 34.5 g/dL (ref 30.0–36.0)
MCV: 88.1 fL (ref 78.0–100.0)
PLATELETS: 166 10*3/uL (ref 150–400)
RBC: 4.71 MIL/uL (ref 4.22–5.81)
RDW: 11.9 % (ref 11.5–15.5)
WBC: 6.2 10*3/uL (ref 4.0–10.5)

## 2014-08-14 LAB — PLATELET INHIBITION P2Y12: PLATELET FUNCTION P2Y12: 143 [PRU] — AB (ref 194–418)

## 2014-08-14 LAB — CREATININE, SERUM
Creatinine, Ser: 0.97 mg/dL (ref 0.50–1.35)
GFR calc non Af Amer: 85 mL/min — ABNORMAL LOW (ref >90)

## 2014-08-14 LAB — POCT ACTIVATED CLOTTING TIME: ACTIVATED CLOTTING TIME: 313 s

## 2014-08-14 SURGERY — LEFT HEART CATHETERIZATION WITH CORONARY ANGIOGRAM

## 2014-08-14 MED ORDER — NITROGLYCERIN 0.4 MG SL SUBL: 0.4000 mg | SUBLINGUAL_TABLET | SUBLINGUAL | Status: DC | PRN

## 2014-08-14 MED ORDER — ATORVASTATIN CALCIUM 80 MG PO TABS
80.0000 mg | ORAL_TABLET | Freq: Every day | ORAL | Status: DC
  Administered 2014-08-14 – 2014-08-21 (×8): 80 mg via ORAL
  Filled 2014-08-14 (×9): qty 1

## 2014-08-14 MED ORDER — MIDAZOLAM HCL 2 MG/2ML IJ SOLN
INTRAMUSCULAR | Status: AC
  Filled 2014-08-14: qty 2

## 2014-08-14 MED ORDER — HEPARIN (PORCINE) IN NACL 2-0.9 UNIT/ML-% IJ SOLN
INTRAMUSCULAR | Status: AC
  Filled 2014-08-14: qty 1500

## 2014-08-14 MED ORDER — SODIUM CHLORIDE 0.9 % IJ SOLN: 3.0000 mL | INTRAMUSCULAR | Status: DC | PRN

## 2014-08-14 MED ORDER — VERAPAMIL HCL 2.5 MG/ML IV SOLN
INTRAVENOUS | Status: AC
  Filled 2014-08-14: qty 2

## 2014-08-14 MED ORDER — ASPIRIN 81 MG PO CHEW
81.0000 mg | CHEWABLE_TABLET | ORAL | Status: AC
  Administered 2014-08-14: 81 mg via ORAL

## 2014-08-14 MED ORDER — NITROGLYCERIN 1 MG/10 ML FOR IR/CATH LAB
INTRA_ARTERIAL | Status: AC
  Filled 2014-08-14: qty 10

## 2014-08-14 MED ORDER — HEPARIN SODIUM (PORCINE) 1000 UNIT/ML IJ SOLN
INTRAMUSCULAR | Status: AC
  Filled 2014-08-14: qty 1

## 2014-08-14 MED ORDER — ASPIRIN 81 MG PO CHEW
CHEWABLE_TABLET | ORAL | Status: AC
  Filled 2014-08-14: qty 1

## 2014-08-14 MED ORDER — SODIUM CHLORIDE 0.9 % IV SOLN
1.0000 mL/kg/h | INTRAVENOUS | Status: AC
  Administered 2014-08-14: 1 mL/kg/h via INTRAVENOUS

## 2014-08-14 MED ORDER — ASPIRIN 81 MG PO CHEW
81.0000 mg | CHEWABLE_TABLET | Freq: Every day | ORAL | Status: DC
  Administered 2014-08-15 – 2014-08-16 (×2): 81 mg via ORAL
  Filled 2014-08-14 (×3): qty 1

## 2014-08-14 MED ORDER — ASPIRIN 81 MG PO TABS: 81.0000 mg | ORAL_TABLET | Freq: Every day | ORAL | Status: DC

## 2014-08-14 MED ORDER — SODIUM CHLORIDE 0.9 % IV SOLN
INTRAVENOUS | Status: DC
  Administered 2014-08-14: 08:00:00 via INTRAVENOUS

## 2014-08-14 MED ORDER — LIDOCAINE HCL (PF) 1 % IJ SOLN
INTRAMUSCULAR | Status: AC
  Filled 2014-08-14: qty 30

## 2014-08-14 MED ORDER — EZETIMIBE 10 MG PO TABS
10.0000 mg | ORAL_TABLET | Freq: Every day | ORAL | Status: DC
  Administered 2014-08-14 – 2014-08-22 (×9): 10 mg via ORAL
  Filled 2014-08-14 (×9): qty 1

## 2014-08-14 MED ORDER — ONDANSETRON HCL 4 MG/2ML IJ SOLN: 4.0000 mg | Freq: Four times a day (QID) | INTRAMUSCULAR | Status: DC | PRN

## 2014-08-14 MED ORDER — PANTOPRAZOLE SODIUM 40 MG PO TBEC
40.0000 mg | DELAYED_RELEASE_TABLET | Freq: Every day | ORAL | Status: DC
  Administered 2014-08-17: 40 mg via ORAL
  Filled 2014-08-14 (×2): qty 1

## 2014-08-14 MED ORDER — ACETAMINOPHEN 325 MG PO TABS
650.0000 mg | ORAL_TABLET | ORAL | Status: DC | PRN
  Administered 2014-08-15: 650 mg via ORAL
  Filled 2014-08-14: qty 2

## 2014-08-14 MED ORDER — SODIUM CHLORIDE 0.9 % IJ SOLN: 3.0000 mL | Freq: Two times a day (BID) | INTRAMUSCULAR | Status: DC

## 2014-08-14 MED ORDER — SODIUM CHLORIDE 0.9 % IV SOLN: 250.0000 mL | INTRAVENOUS | Status: DC | PRN

## 2014-08-14 MED ORDER — HEPARIN SODIUM (PORCINE) 5000 UNIT/ML IJ SOLN
5000.0000 [IU] | Freq: Three times a day (TID) | INTRAMUSCULAR | Status: AC
  Administered 2014-08-14 – 2014-08-16 (×7): 5000 [IU] via SUBCUTANEOUS
  Filled 2014-08-14 (×7): qty 1

## 2014-08-14 MED ORDER — FENTANYL CITRATE 0.05 MG/ML IJ SOLN
INTRAMUSCULAR | Status: AC
  Filled 2014-08-14: qty 2

## 2014-08-14 NOTE — CV Procedure (Signed)
       PROCEDURE:  Left heart catheterization with selective coronary angiography, left ventriculogram.  IVUS LAD,   INDICATIONS:  Angina  The risks, benefits, and details of the procedure were explained to the patient.  The patient verbalized understanding and wanted to proceed.  Informed written consent was obtained.  PROCEDURE TECHNIQUE:  After Xylocaine anesthesia a 68F slender sheath was placed in the right radial artery with a single anterior needle wall stick.   IV Heparin was given.  Right coronary angiography was done using a Judkins R4 guide catheter.  Left coronary angiography was done using a Judkins L3.5 guide catheter.  Left ventriculography was done using a pigtail catheter.  A TR band was used for hemostasis.   CONTRAST:  Total of 110 cc.  COMPLICATIONS:  None.    HEMODYNAMICS:  Aortic pressure was 114/64; LV pressure was 111/0; LVEDP 3.  There was no gradient between the left ventricle and aorta.    ANGIOGRAPHIC DATA:   The left main coronary artery is very short and appears moderately diseased.  The left anterior descending artery is a large vessel.  THe ostium has moderate calcific disease.  The stent is widely patent.  The first diagonal has an ostial 80% stenosis.  The left circumflex artery is a large vessel with a 90% ostial stenosis.  The OM1 has a 50% stenosis. The remainder of the circumflex is patent.  The right coronary artery is a large dominant vessel.  The mid vessel stent is widely patent.  Posterior lateral artery and posterior descending artery are large and widely patent.  LEFT VENTRICULOGRAM:  Left ventricular angiogram was done in the 30 RAO projection and revealed normal left ventricular wall motion and systolic function with an estimated ejection fraction of 55%.  LVEDP was 3 mmHg.  INTRAVASCULAR ULTRASOUND: An 5 [place] EBU 3 guiding catheter was used to engage the left main. Heparin was given for anticoagulation. ACT was used to check that the  heparin was therapeutic. Pro-water was placed into the LAD. A 5 French IVUS catheter was advanced to the proximal LAD. Pullback was performed. This showed severe, calcific disease of the ostial LAD. The cross-sectional area was 3.5 m.  The wire and catheter removed. Final angiography showed TIMI-3 flow in both the LAD and the circumflex. This did better show the lesion being in the very ostium of the circumflex and involving the distal left main and ostial LAD.  IMPRESSIONS:  1. Short, moderately diseased left main coronary artery. 2. Moderate disease in the ostial left anterior descending artery.  Severe disease in the ostial first diagonal branch.  Intravascular ultrasound showed a cross-sectional area of 3.5 mm and the ostial LAD. 3. Severe disease in the ostial left circumflex artery extending back to the left main and the ostial LAD. 4. Patent stent in the mid right coronary artery.  No significant disease. 5. Normal left ventricular systolic function.  LVEDP 3 mmHg.  Ejection fraction 55%.  RECOMMENDATION:  Progression of the distal left main, ostial LAD and ostial circumflex disease from prior cath. Will obtain cardiac surgery consult. The patient is on Plavix. This is being held.  Consider starting heparin after sheath pull. He may need a IIb IIIa inhibitor just before the surgery since his Plavix will be wearing off.

## 2014-08-14 NOTE — Consult Note (Signed)
Reason for Consult: Left main and 2 vessel CAD Referring Physician: Dr. Tora Perches  Michael Herrera is an 64 y.o. male.  HPI: 64 yo man with a cc/o chest pain  Michael Herrera is a 64 yo man with a history dyslipidemia and of CAD dating back to 2001. He had stents placed in the RCA in 2001. He did well until March of 2015 when he had recurrent angina. A stress test was unremarkable but he had persistent symptoms and underwent catheterization. He had a 90% proximal LAD stenosis and a DES was placed. He again did well after that procedure until a couple of weeks ago, when had recurrent exertional angina. He describes this as a dull pain that begins in his chest and radiates to his back and his teeth. He has been having symptoms daily. It has been relieved by rest and is not associated with nausea, vomiting, shortness of breath, or diapheresis.  He was scheduled for cardiac catheterization and that was performed today. He was found to have progression of his left main disease and a critical lesion at the takeoff of the circumflex. His LAD and right coronary stents are patent. He does have stenoses in a first diagonal branch and his first obtuse marginal, which were present previously. His ejection fraction was 55%.  Michael Herrera denies any significant family history of CAD. He does have a history of dyslipidemia. He denies hypertension. He is a lifelong nonsmoker. He eats a heart healthy diet and exercises on a daily basis, walking 2-3 miles a day. He is retired Airline pilot.  Past Medical History  Diagnosis Date  . GERD (gastroesophageal reflux disease)   . Hypercholesteremia   . Low back pain   . Coronary atherosclerosis of native coronary artery 2001    a. s/p PCI with  3.0 x 32 Promus DES mLAD. Ca L main w/o sig obst, mod dz in mLCx. Moderate focal lesion in OM1. Mild to mod dz & patent stents in RCA  . Skin cancer of face   . History of blood transfusion 1956  . Dyslipidemia     Past Surgical  History  Procedure Laterality Date  . Coronary angioplasty with stent placement  2001; 10/29/2013    PCI of RCA "X 2"; PCI of LAD  . Tonsillectomy and adenoidectomy  ~ 1956  . Knee arthroscopy Right 1980's  . Knee arthroscopy Left 2007; 2008  . Refractive surgery Bilateral ?2007  . Skin cancer excision Left     "cheek"  . Left heart catheterization with coronary angiogram N/A 10/29/2013    Procedure: LEFT HEART CATHETERIZATION WITH CORONARY ANGIOGRAM;  Surgeon: Jettie Booze, MD;  Location: [facility] CATH LAB;  Service: Cardiovascular;  Laterality: N/A;    Family History  Problem Relation Age of Onset  . Dementia Father     Social History:  reports that he has never smoked. He has never used smokeless tobacco. He reports that he drinks about 1.8 oz of alcohol per week. He reports that he does not use illicit drugs.  Allergies: No Known Allergies  Medications:  Prior to Admission:  Prescriptions prior to admission  Medication Sig Dispense Refill Last Dose  . aspirin 81 MG tablet Take 81 mg by mouth every evening.    08/13/2014 at 2000  . atorvastatin (LIPITOR) 80 MG tablet Take 1 tablet (80 mg total) by mouth daily. (Patient taking differently: Take 80 mg by mouth every evening. ) 90 tablet 3 08/13/2014 at 2000  . clopidogrel (PLAVIX)  75 MG tablet Take 1 tablet (75 mg total) by mouth daily with breakfast. STOP MARCH 2016 90 tablet 3 08/13/2014 at 0800  . diclofenac (VOLTAREN) 75 MG EC tablet Take 75 mg by mouth daily as needed (back pain).    Past Month at Unknown time  . esomeprazole (NEXIUM) 20 MG capsule Take 20 mg by mouth every morning.    08/14/2014 at 0515  . nitroGLYCERIN (NITROSTAT) 0.4 MG SL tablet Place 0.4 mg under the tongue every 5 (five) minutes as needed for chest pain.   08/09/2014  . Omega-3 Fatty Acids (FISH OIL) 1200 MG CAPS Take 1,200 mg by mouth every morning.   08/14/2014 at Unknown time  . vitamin Herrera (ASCORBIC ACID) 500 MG tablet Take 500 mg by mouth every morning.     08/13/2014 at [place]  . vitamin E 400 UNIT capsule Take 400 Units by mouth every morning.    08/14/2014 at 0515  . ezetimibe (ZETIA) 10 MG tablet Take 1 tablet (10 mg total) by mouth daily. (Patient not taking: Reported on 08/14/2014) 90 tablet 3 Not Taking at Unknown time    Results for orders placed or performed during the hospital encounter of 08/14/14 (from the past 48 hour(s))  CBC     Status: None   Collection Time: 08/14/14  1:55 PM  Result Value Ref Range   WBC 6.2 4.0 - 10.5 K/uL   RBC 4.71 4.22 - 5.81 MIL/uL   Hemoglobin 14.3 13.0 - 17.0 g/dL   HCT 41.5 39.0 - 52.0 %   MCV 88.1 78.0 - 100.0 fL   MCH 30.4 26.0 - 34.0 pg   MCHC 34.5 30.0 - 36.0 g/dL   RDW 11.9 11.5 - 15.5 %   Platelets 166 150 - 400 K/uL  Creatinine, serum     Status: Abnormal   Collection Time: 08/14/14  1:55 PM  Result Value Ref Range   Creatinine, Ser 0.97 0.50 - 1.35 mg/dL   GFR calc non Af Amer 85 (L) >90 mL/min   GFR calc Af Amer >90 >90 mL/min    Comment: (NOTE) The eGFR has been calculated using the CKD EPI equation. This calculation has not been validated in all clinical situations. eGFR's persistently <90 mL/min signify possible Chronic Kidney Disease.     No results found.  Review of Systems  Constitutional: Negative for fever, chills and malaise/fatigue.  Cardiovascular: Positive for chest pain.  Gastrointestinal: Positive for heartburn.  Neurological: Negative for weakness.  All other systems reviewed and are negative.  Blood pressure 121/70, pulse 85, temperature 98.6 F (37 Herrera), temperature source Oral, resp. rate 14, height 5' 9" (1.753 m), weight 148 lb (67.132 kg), SpO2 97 %. Physical Exam  Vitals reviewed. Constitutional: He is oriented to person, place, and time. He appears well-developed and well-nourished. No distress.  HENT:  Head: Normocephalic and atraumatic.  Eyes: EOM are normal. Pupils are equal, round, and reactive to light.  Neck: Neck supple. No thyromegaly present.  No  bruits  Cardiovascular: Normal rate, regular rhythm, normal heart sounds and intact distal pulses.  Exam reveals no gallop and no friction rub.   No murmur heard. Respiratory: Breath sounds normal. He has no wheezes. He has no rales.  GI: Soft. Bowel sounds are normal. There is no tenderness.  Musculoskeletal: He exhibits no edema.  Lymphadenopathy:    He has no cervical adenopathy.  Neurological: He is alert and oriented to person, place, and time. No cranial nerve deficit.  No focal deficit  Skin: Skin is warm and dry.      CARDIAC CATHETERIZATION HEMODYNAMICS: Aortic pressure was 114/64; LV pressure was 111/0; LVEDP 3. There was no gradient between the left ventricle and aorta.   ANGIOGRAPHIC DATA: The left main coronary artery is very short and appears moderately diseased.  The left anterior descending artery is a large vessel. THe ostium has moderate calcific disease. The stent is widely patent. The first diagonal has an ostial 80% stenosis.  The left circumflex artery is a large vessel with a 90% ostial stenosis. The OM1 has a 50% stenosis. The remainder of the circumflex is patent.  The right coronary artery is a large dominant vessel. The mid vessel stent is widely patent. Posterior lateral artery and posterior descending artery are large and widely patent.  LEFT VENTRICULOGRAM: Left ventricular angiogram was done in the 30 RAO projection and revealed normal left ventricular wall motion and systolic function with an estimated ejection fraction of 55%. LVEDP was 3 mmHg.  INTRAVASCULAR ULTRASOUND: An 5 [place] EBU 3 guiding catheter was used to engage the left main. Heparin was given for anticoagulation. ACT was used to check that the heparin was therapeutic. Pro-water was placed into the LAD. A 5 French IVUS catheter was advanced to the proximal LAD. Pullback was performed. This showed severe, calcific disease of the ostial LAD. The cross-sectional area was 3.5  m.  The wire and catheter removed. Final angiography showed TIMI-3 flow in both the LAD and the circumflex. This did better show the lesion being in the very ostium of the circumflex and involving the distal left main and ostial LAD.  IMPRESSIONS:  1. Short, moderately diseased left main coronary artery. 2. Moderate disease in the ostial left anterior descending artery. Severe disease in the ostial first diagonal branch. Intravascular ultrasound showed a cross-sectional area of 3.5 mm and the ostial LAD. 3. Severe disease in the ostial left circumflex artery extending back to the left main and the ostial LAD. 4. Patent stent in the mid right coronary artery. No significant disease. 5. Normal left ventricular systolic function. LVEDP 3 mmHg. Ejection fraction 55%.  RECOMMENDATION: Progression of the distal left main, ostial LAD and ostial circumflex disease from prior cath. Will obtain cardiac surgery consult. The patient is on Plavix. This is being held. Consider starting heparin after sheath pull. He may need a IIb IIIa inhibitor just before the surgery since his Plavix will be wearing off.]  Assessment/Plan: 64 year old man with long history of coronary disease who has exertional angina. A catheterization he has left main and two-vessel coronary disease with a tight stenosis involving the takeoff of his left circumflex. Coronary bypass grafting is indicated for survival benefit as well as relief of symptoms.  I discussed  the general nature of the procedure, the need for general anesthesia, and the incisions to be used with Michael Herrera and his family. We discussed the expected hospital stay, overall recovery and short and long term outcomes. I reviewed the indications, risks, benefits and alternatives. They understand the risks include, but are not limited to death, stroke, MI, DVT/PE, bleeding, possible need for transfusion, infections, cardiac arrhythmias, as well as other organ system  dysfunction including respiratory, renal, or GI complications.   He wishes to proceed with CABG.  He has been on Plavix. We discussed the recommended waiting time of 5-7 days prior to CABG due to the risk of major bleeding complications. He is anxious to proceed and does not want to wait that long. I  will order a P2Y12 assay to get an indication as to the plavix effect and whether it would be safe to proceed sooner.  Michael Herrera,Michael Herrera 08/14/2014, 4:02 PM

## 2014-08-14 NOTE — H&P (View-Only) (Signed)
[facility], [place], [place]  [address] Phone: [phone] Fax:  [phone]  Date:  08/11/2014   ID:  Michael Herrera, DOB April 06, 1951, MRN [id number]  PCP:  Michael Miyamoto, MD  Cardiologist:  Michael Him, MD    History of Present Illness: Michael Herrera is a 64 y.o. male with a history of ASCAD and dyslipidemia, who recently began to complain of tooth pain similar to his prior angina. He underwent nuclear stress test which showed no ischemia. Due to persistence of symptoms he underwent cardiac cath showing calcified LM with no stenosis, 90% proximal to mid LAD, moderate disease of the mid left circ with a focal 50% lesion in the OM and mid left circ and patent RCA with mild diffuse atherosclerosis in the RCA. The stents in the mid to distal RCA were widely patent. He underwent DES to the LAD.  He did well after PCI but he now presents back today for evaluation of chest pain.  He says that it is a dull pain that seems to come on when he exerts himself. The pain will radiate to his teeth and to his back.  He is getting it on a daily basis.  He has no rest pain, only exertional and it is not associated with SOB.  Wt Readings from Last 3 Encounters:  08/11/14 148 lb (67.132 kg)  05/15/14 148 lb (67.132 kg)  11/13/13 146 lb (66.225 kg)     Past Medical History  Diagnosis Date  . GERD (gastroesophageal reflux disease)   . Hypercholesteremia   . Low back pain   . Coronary atherosclerosis of native coronary artery 2001    a. s/p PCI with  3.0 x 32 Promus DES mLAD. Ca L main w/o sig obst, mod dz in mLCx. Moderate focal lesion in OM1. Mild to mod dz & patent stents in RCA  . Skin cancer of face   . History of blood transfusion 1956  . Dyslipidemia     Current Outpatient Prescriptions  Medication Sig Dispense Refill  . aspirin 81 MG tablet Take 81 mg by mouth daily.    [name] atorvastatin (LIPITOR) 80 MG tablet Take 1 tablet (80 mg total) by mouth daily. 90 tablet 3  . clopidogrel  (PLAVIX) 75 MG tablet Take 1 tablet (75 mg total) by mouth daily with breakfast. STOP MARCH 2016 90 tablet 3  . diclofenac (VOLTAREN) 75 MG EC tablet Take 75 mg by mouth daily as needed.    [name] esomeprazole (NEXIUM) 20 MG capsule Take 20 mg by mouth daily at 12 noon.    . nitroGLYCERIN (NITROSTAT) 0.4 MG SL tablet Place 0.4 mg under the tongue every 5 (five) minutes as needed for chest pain.    . Omega-3 Fatty Acids (FISH OIL PO) Take 1,200 mg by mouth daily.     . vitamin C (ASCORBIC ACID) 500 MG tablet Take 500 mg by mouth daily.    . vitamin E 400 UNIT capsule Take 400 Units by mouth daily.     No current facility-administered medications for this visit.    Allergies:   No Known Allergies  Social History:  The patient  reports that he has never smoked. He has never used smokeless tobacco. He reports that he drinks about 1.8 oz of alcohol per week. He reports that he does not use illicit drugs.   Family History:  The patient's family history includes Dementia in his father.   ROS:  Please see the  history of present illness.      All other systems reviewed and negative.   PHYSICAL EXAM: VS:  BP 144/94 mmHg  Pulse 65  Ht 5\' 9"  (1.753 m)  Wt 148 lb (67.132 kg)  BMI 21.85 kg/m2 Well nourished, well developed, in no acute distress HEENT: normal Neck: no JVD Cardiac:  normal S1, S2; RRR; no murmur Lungs:  clear to auscultation bilaterally, no wheezing, rhonchi or rales Abd: soft, nontender, no hepatomegaly Ext: no edema Skin: warm and dry Neuro:  CNs 2-12 intact, no focal abnormalities noted  EKG:  NSR with sinus arrhythmias     ASSESSMENT AND PLAN:  1. ASCAD with recent angina with cath showing patent RCA stents and 90% proximal to mid LAD lesion s/p DES stent.   - continue ASA/Plavix  2. Dyslipidemia - last LDL was not at goal but he did not want to uptitrate his statin and wanted to try diet - continue fish oil/Atorvastatin.  He is agreeable to adding Zetia 10mg  daily but we  will check with his insurance to see how much his copay will be.       3.  Chest pain concerning for exertional angina.  This pain also radiates into his jaw and back and does improve with SL NTG.  His angina in the past has always involved jaw and teeth pain.  His last nuclear stress test was normal in the setting of obstructive ASCAD so I do not think that proceeding with repeat stress test would have much yield.  He is less than a year out from PCI so I have recommended that we proceed with cardiac cath to reevaluate his coronary anatomy.  Cardiac catheterization was discussed with the patient fully including risks of myocardial infarction, death, stroke, bleeding, arrhythmia, dye allergy, renal insufficiency or bleeding.  All patient questions and concerns were discussed and the patient understands and is willing to proceed.    Followup with me after cath  Signed, Michael Him, MD [facility] 08/11/2014 2:38 PM

## 2014-08-14 NOTE — Progress Notes (Signed)
UR Completed Malahki Gasaway Graves-Bigelow, RN,BSN [phone]  

## 2014-08-14 NOTE — Progress Notes (Signed)
P2Y12 = 143 c/w positive effect of plavix  Will check again Sunday AM, if closer to normal range may be able to go ahead with CABG Monday afternoon

## 2014-08-14 NOTE — Interval H&P Note (Signed)
Cath Lab Visit (complete for each Cath Lab visit)  Clinical Evaluation Leading to the Procedure:   ACS: No.  Non-ACS:    Anginal Classification: CCS III  Anti-ischemic medical therapy: Minimal Therapy (1 class of medications)  Non-Invasive Test Results: No non-invasive testing performed  Prior CABG: No previous CABG  Ischemic Symptoms? CCS III (Marked limitation of ordinary activity) Anti-ischemic Medical Therapy? No Therapy Non-invasive Test Results? No non-invasive testing performed Prior CABG? No Previous CABG   Patient Information:   1-2V CAD, no prox LAD  A (7)  Indication: 20; Score: 7   Patient Information:   1-2V-CAD with DS 50-60% With No FFR, No IVUS  I (3)  Indication: 21; Score: 3   Patient Information:   1-2V-CAD with DS 50-60% With FFR  A (7)  Indication: 22; Score: 7   Patient Information:   1-2V-CAD with DS 50-60% With FFR>0.8, IVUS not significant  I (2)  Indication: 23; Score: 2   Patient Information:   3V-CAD without LMCA With Abnormal LV systolic function  A (9)  Indication: 48; Score: 9   Patient Information:   LMCA-CAD  A (9)  Indication: 49; Score: 9   Patient Information:   2V-CAD with prox LAD PCI  A (7)  Indication: 62; Score: 7   Patient Information:   2V-CAD with prox LAD CABG  A (8)  Indication: 62; Score: 8   Patient Information:   3V-CAD without LMCA With Low CAD burden(i.e., 3 focal stenoses, low SYNTAX score) PCI  A (7)  Indication: 63; Score: 7   Patient Information:   3V-CAD without LMCA With Low CAD burden(i.e., 3 focal stenoses, low SYNTAX score) CABG  A (9)  Indication: 63; Score: 9   Patient Information:   3V-CAD without LMCA E06c - Intermediate-high CAD burden (i.e., multiple diffuse lesions, presence of CTO, or high SYNTAX score) PCI  U (4)  Indication: 64; Score: 4   Patient Information:   3V-CAD without LMCA E06c - Intermediate-high CAD burden (i.e., multiple  diffuse lesions, presence of CTO, or high SYNTAX score) CABG  A (9)  Indication: 64; Score: 9   Patient Information:   LMCA-CAD With Isolated LMCA stenosis  PCI  U (6)  Indication: 65; Score: 6   Patient Information:   LMCA-CAD With Isolated LMCA stenosis  CABG  A (9)  Indication: 65; Score: 9   Patient Information:   LMCA-CAD Additional CAD, low CAD burden (i.e., 1- to 2-vessel additional involvement, low SYNTAX score) PCI  U (5)  Indication: 66; Score: 5   Patient Information:   LMCA-CAD Additional CAD, low CAD burden (i.e., 1- to 2-vessel additional involvement, low SYNTAX score) CABG  A (9)  Indication: 66; Score: 9   Patient Information:   LMCA-CAD Additional CAD, intermediate-high CAD burden (i.e., 3-vessel involvement, presence of CTO, or high SYNTAX score) PCI  I (3)  Indication: 67; Score: 3   Patient Information:   LMCA-CAD Additional CAD, intermediate-high CAD burden (i.e., 3-vessel involvement, presence of CTO, or high SYNTAX score) CABG  A (9)  Indication: 67; Score: 9     History and Physical Interval Note:  08/14/2014 10:59 AM  Michael Herrera  has presented today for surgery, with the diagnosis of cp  The various methods of treatment have been discussed with the patient and family. After consideration of risks, benefits and other options for treatment, the patient has consented to  Procedure(s): LEFT HEART CATHETERIZATION WITH CORONARY ANGIOGRAM (N/A) as a surgical intervention .  The patient's history has been reviewed, patient examined, no change in status, stable for surgery.  I have reviewed the patient's chart and labs.  Questions were answered to the patient's satisfaction.     [name] S.

## 2014-08-15 DIAGNOSIS — E785 Hyperlipidemia, unspecified: Secondary | ICD-10-CM

## 2014-08-15 DIAGNOSIS — Z01810 Encounter for preprocedural cardiovascular examination: Secondary | ICD-10-CM

## 2014-08-15 DIAGNOSIS — I2510 Atherosclerotic heart disease of native coronary artery without angina pectoris: Secondary | ICD-10-CM

## 2014-08-15 DIAGNOSIS — I209 Angina pectoris, unspecified: Secondary | ICD-10-CM

## 2014-08-15 LAB — PLATELET INHIBITION P2Y12: Platelet Function  P2Y12: 124 [PRU] — ABNORMAL LOW (ref 194–418)

## 2014-08-15 MED ORDER — CARVEDILOL 3.125 MG PO TABS
3.1250 mg | ORAL_TABLET | Freq: Two times a day (BID) | ORAL | Status: DC
  Filled 2014-08-15 (×3): qty 1

## 2014-08-15 MED ORDER — ALPRAZOLAM 0.5 MG PO TABS
0.5000 mg | ORAL_TABLET | Freq: Two times a day (BID) | ORAL | Status: DC | PRN
  Administered 2014-08-16: 0.5 mg via ORAL
  Filled 2014-08-15: qty 1

## 2014-08-15 NOTE — Progress Notes (Signed)
Patient Name: Michael Herrera Date of Encounter: 08/15/2014  Active Problems:   Angina pectoris   Length of Stay: 1  SUBJECTIVE  The patient denies any chest pain. He has a lot of questions regarding medications and surgery, he seems to be anxious.   CURRENT MEDS . aspirin  81 mg Oral Daily  . atorvastatin  80 mg Oral q1800  . ezetimibe  10 mg Oral Daily  . heparin  5,000 Units Subcutaneous 3 times per day  . pantoprazole  40 mg Oral Daily   OBJECTIVE  Filed Vitals:   08/14/14 1537 08/14/14 1558 08/14/14 2100 08/15/14 0500  BP: 123/76 121/70 116/69 110/71  Pulse:   68 79  Temp:   97.6 F (36.4 C) 97.3 F (36.3 C)  TempSrc:      Resp: 13 14 14 12   Height:      Weight:    148 lb 3.2 oz (67.223 kg)  SpO2: 98% 97% 99% 99%    Intake/Output Summary (Last 24 hours) at 08/15/14 0951 Last data filed at 08/15/14 0844  Gross per 24 hour  Intake    360 ml  Output    500 ml  Net   -140 ml   Filed Weights   08/14/14 0657 08/15/14 0500  Weight: 148 lb (67.132 kg) 148 lb 3.2 oz (67.223 kg)    PHYSICAL EXAM  General: Pleasant, NAD. Neuro: Alert and oriented X 3. Moves all extremities spontaneously. Psych: Normal affect. HEENT:  Normal  Neck: Supple without bruits or JVD. Lungs:  Resp regular and unlabored, CTA. Heart: RRR no s3, s4, or murmurs. Abdomen: Soft, non-tender, non-distended, BS + x 4.  Extremities: No clubbing, cyanosis or edema. DP/PT/Radials 2+ and equal bilaterally.  Accessory Clinical Findings  CBC  Recent Labs  08/14/14 1355  WBC 6.2  HGB 14.3  HCT 41.5  MCV 88.1  PLT 008   Basic Metabolic Panel  Recent Labs  08/14/14 1355  CREATININE 0.97    Radiology/Studies  No results found.  TELE: SR, 70' BPM  Left cath: 08/14/2014 IMPRESSIONS:  1. Short, moderately diseased left main coronary artery. 2. Moderate disease in the ostial left anterior descending artery. Severe disease in the ostial first diagonal branch. Intravascular  ultrasound showed a cross-sectional area of 3.5 mm and the ostial LAD. 3. Severe disease in the ostial left circumflex artery extending back to the left main and the ostial LAD. 4. Patent stent in the mid right coronary artery. No significant disease. 5. Normal left ventricular systolic function. LVEDP 3 mmHg. Ejection fraction 55%.  RECOMMENDATION: Progression of the distal left main, ostial LAD and ostial circumflex disease from prior cath. Will obtain cardiac surgery consult. The patient is on Plavix. This is being held. Consider starting heparin after sheath pull. He may need a IIb IIIa inhibitor just before the surgery since his Plavix will be wearing off.   ASSESSMENT AND PLAN  1. CAD - LM disease on cath yesterday,  Also ostial LAD and ostial circumflex disease, he is scheduled for a  CABG the next week, timing depending on platelet inhibition - yesterday 143, we will repeat tomorrow, if ok, CABG on Monday by Dr [name].  Continue low dose ASA, atorvastatin, zetia, add carvedilol 3.125 mg po BID  2. Dyslipidemia - last LDL was not at goal but he did not want to uptitrate his statin and wanted to try diet - continue fish oil/Atorvastatin. He is agreeable to adding Zetia 10mg  daily but we  will check with his insurance to see how much his copay will be.  3. BP - controlled   Signed, Dorothy Spark MD, [facility] 08/15/2014

## 2014-08-15 NOTE — Progress Notes (Signed)
Pre-op Cardiac Surgery  Carotid Findings:  1-39% ICA stenosis.  Vertebral artery flow is antegrade.   Upper Extremity Right Left  Brachial Pressures 136T 143T  Radial Waveforms T T  Ulnar Waveforms T T  Palmar Arch (Allen's Test) WNL WNL   Findings:      Lower  Extremity Right Left  Dorsalis Pedis    Anterior Tibial    Posterior Tibial    Ankle/Brachial Indices      Findings:

## 2014-08-15 NOTE — Progress Notes (Signed)
Patient  ambulating in hallway independently multiple times this shift. Wife at bedside. Preop CABG education completed and booklet given. Wife and husband had lots of question and patient verbalized anxiety related to surgery. Patient and wife encouraged to watch video and read over information given. RN instructed and patient demonstrated correct use of IS. Will continue to post surgery.

## 2014-08-16 LAB — BLOOD GAS, ARTERIAL
Acid-Base Excess: 2.8 mmol/L — ABNORMAL HIGH (ref 0.0–2.0)
Bicarbonate: 27.1 meq/L — ABNORMAL HIGH (ref 20.0–24.0)
Drawn by: 419341
O2 Saturation: 96.9 %
Patient temperature: 97.8
TCO2: 28.5 mmol/L (ref 0–100)
pCO2 arterial: 43.1 mmHg (ref 35.0–45.0)
pH, Arterial: 7.413 (ref 7.350–7.450)
pO2, Arterial: 84.8 mmHg (ref 80.0–100.0)

## 2014-08-16 LAB — SURGICAL PCR SCREEN
MRSA, PCR: POSITIVE — AB
STAPHYLOCOCCUS AUREUS: POSITIVE — AB

## 2014-08-16 LAB — COMPREHENSIVE METABOLIC PANEL
ALK PHOS: 108 U/L (ref 39–117)
ALT: 52 U/L (ref 0–53)
AST: 37 U/L (ref 0–37)
Albumin: 4 g/dL (ref 3.5–5.2)
Anion gap: 8 (ref 5–15)
BILIRUBIN TOTAL: 0.7 mg/dL (ref 0.3–1.2)
BUN: 16 mg/dL (ref 6–23)
CALCIUM: 9.5 mg/dL (ref 8.4–10.5)
CO2: 28 mmol/L (ref 19–32)
Chloride: 104 mEq/L (ref 96–112)
Creatinine, Ser: 1.04 mg/dL (ref 0.50–1.35)
GFR, EST AFRICAN AMERICAN: 86 mL/min — AB (ref >90)
GFR, EST NON AFRICAN AMERICAN: 74 mL/min — AB (ref >90)
Glucose, Bld: 99 mg/dL (ref 70–99)
Potassium: 3.9 mmol/L (ref 3.5–5.1)
Sodium: 140 mmol/L (ref 135–145)
TOTAL PROTEIN: 6.5 g/dL (ref 6.0–8.3)

## 2014-08-16 LAB — URINALYSIS, ROUTINE W REFLEX MICROSCOPIC
Bilirubin Urine: NEGATIVE
GLUCOSE, UA: NEGATIVE mg/dL
HGB URINE DIPSTICK: NEGATIVE
Ketones, ur: NEGATIVE mg/dL
LEUKOCYTES UA: NEGATIVE
Nitrite: NEGATIVE
PROTEIN: NEGATIVE mg/dL
Specific Gravity, Urine: 1.013 (ref 1.005–1.030)
Urobilinogen, UA: 1 mg/dL (ref 0.0–1.0)
pH: 5.5 (ref 5.0–8.0)

## 2014-08-16 LAB — PLATELET INHIBITION P2Y12: Platelet Function  P2Y12: 151 [PRU] — ABNORMAL LOW (ref 194–418)

## 2014-08-16 LAB — APTT: aPTT: 44 s — ABNORMAL HIGH (ref 24–37)

## 2014-08-16 MED ORDER — CEFUROXIME SODIUM 750 MG IJ SOLR
750.0000 mg | INTRAMUSCULAR | Status: DC
  Filled 2014-08-16: qty 750

## 2014-08-16 MED ORDER — POLYETHYLENE GLYCOL 3350 17 G PO PACK
17.0000 g | PACK | Freq: Every day | ORAL | Status: DC | PRN
  Administered 2014-08-16: 17 g via ORAL
  Filled 2014-08-16 (×2): qty 1

## 2014-08-16 MED ORDER — MUPIROCIN 2 % EX OINT
1.0000 "application " | TOPICAL_OINTMENT | Freq: Two times a day (BID) | CUTANEOUS | Status: AC
  Administered 2014-08-17 – 2014-08-21 (×8): 1 via NASAL
  Filled 2014-08-16 (×4): qty 22

## 2014-08-16 MED ORDER — CHLORHEXIDINE GLUCONATE CLOTH 2 % EX PADS: 6.0000 | MEDICATED_PAD | Freq: Once | CUTANEOUS | Status: DC

## 2014-08-16 MED ORDER — CHLORHEXIDINE GLUCONATE CLOTH 2 % EX PADS
6.0000 | MEDICATED_PAD | Freq: Every day | CUTANEOUS | Status: DC
  Administered 2014-08-16 – 2014-08-17 (×2): 6 via TOPICAL

## 2014-08-16 MED ORDER — MUPIROCIN 2 % EX OINT
1.0000 "application " | TOPICAL_OINTMENT | Freq: Two times a day (BID) | CUTANEOUS | Status: DC
  Administered 2014-08-16 – 2014-08-17 (×2): 1 via NASAL

## 2014-08-16 MED ORDER — CHLORHEXIDINE GLUCONATE CLOTH 2 % EX PADS: 6.0000 | MEDICATED_PAD | Freq: Every day | CUTANEOUS | Status: DC

## 2014-08-16 MED ORDER — CHLORHEXIDINE GLUCONATE 4 % EX LIQD
CUTANEOUS | Status: AC
  Administered 2014-08-17: 06:00:00
  Filled 2014-08-16: qty 15

## 2014-08-16 MED ORDER — VANCOMYCIN HCL 10 G IV SOLR
1500.0000 mg | INTRAVENOUS | Status: AC
  Administered 2014-08-17: 1500 mg via INTRAVENOUS
  Filled 2014-08-16: qty 1500

## 2014-08-16 MED ORDER — MAGNESIUM SULFATE 50 % IJ SOLN
40.0000 meq | INTRAMUSCULAR | Status: DC
  Filled 2014-08-16: qty 10

## 2014-08-16 MED ORDER — DIAZEPAM 5 MG PO TABS
5.0000 mg | ORAL_TABLET | Freq: Once | ORAL | Status: AC
  Administered 2014-08-17: 5 mg via ORAL
  Filled 2014-08-16: qty 1

## 2014-08-16 MED ORDER — BISACODYL 5 MG PO TBEC: 5.0000 mg | DELAYED_RELEASE_TABLET | Freq: Once | ORAL | Status: DC

## 2014-08-16 MED ORDER — METOPROLOL TARTRATE 12.5 MG HALF TABLET
12.5000 mg | ORAL_TABLET | Freq: Once | ORAL | Status: AC
  Administered 2014-08-17: 12.5 mg via ORAL
  Filled 2014-08-16: qty 1

## 2014-08-16 MED ORDER — EPINEPHRINE HCL 1 MG/ML IJ SOLN
0.0000 ug/min | INTRAMUSCULAR | Status: DC
  Filled 2014-08-16: qty 4

## 2014-08-16 MED ORDER — PHENYLEPHRINE HCL 10 MG/ML IJ SOLN
30.0000 ug/min | INTRAVENOUS | Status: DC
  Filled 2014-08-16: qty 2

## 2014-08-16 MED ORDER — NITROGLYCERIN IN D5W 200-5 MCG/ML-% IV SOLN
2.0000 ug/min | INTRAVENOUS | Status: AC
  Administered 2014-08-17: 16.6 ug/min via INTRAVENOUS
  Filled 2014-08-16: qty 250

## 2014-08-16 MED ORDER — DOPAMINE-DEXTROSE 3.2-5 MG/ML-% IV SOLN
0.0000 ug/kg/min | INTRAVENOUS | Status: DC
Start: 2014-08-17 — End: 2014-08-17
  Filled 2014-08-16: qty 250

## 2014-08-16 MED ORDER — PLASMA-LYTE 148 IV SOLN
INTRAVENOUS | Status: AC
  Administered 2014-08-17: 500 mL
  Filled 2014-08-16: qty 2.5

## 2014-08-16 MED ORDER — DEXTROSE 5 % IV SOLN
1.5000 g | INTRAVENOUS | Status: AC
  Administered 2014-08-17: 1.5 g via INTRAVENOUS
  Administered 2014-08-17: .75 g via INTRAVENOUS
  Filled 2014-08-16: qty 1.5

## 2014-08-16 MED ORDER — AMINOCAPROIC ACID 250 MG/ML IV SOLN
INTRAVENOUS | Status: AC
  Administered 2014-08-17: 70 mL/h via INTRAVENOUS
  Filled 2014-08-16 (×2): qty 40

## 2014-08-16 MED ORDER — ALPRAZOLAM 0.25 MG PO TABS
0.2500 mg | ORAL_TABLET | ORAL | Status: DC | PRN
  Administered 2014-08-16: 0.25 mg via ORAL
  Filled 2014-08-16: qty 2
  Filled 2014-08-16: qty 1

## 2014-08-16 MED ORDER — POTASSIUM CHLORIDE 2 MEQ/ML IV SOLN
80.0000 meq | INTRAVENOUS | Status: DC
  Filled 2014-08-16: qty 40

## 2014-08-16 MED ORDER — SODIUM CHLORIDE 0.9 % IV SOLN
INTRAVENOUS | Status: AC
  Administered 2014-08-17: 1 [IU]/h via INTRAVENOUS
  Administered 2014-08-17: 1.4 [IU]/h via INTRAVENOUS
  Filled 2014-08-16: qty 2.5

## 2014-08-16 MED ORDER — TEMAZEPAM 15 MG PO CAPS: 15.0000 mg | ORAL_CAPSULE | Freq: Once | ORAL | Status: AC | PRN

## 2014-08-16 MED ORDER — DEXMEDETOMIDINE HCL IN NACL 400 MCG/100ML IV SOLN
0.1000 ug/kg/h | INTRAVENOUS | Status: AC
  Administered 2014-08-17: 0.2 ug/kg/h via INTRAVENOUS
  Filled 2014-08-16: qty 100

## 2014-08-16 MED ORDER — SODIUM CHLORIDE 0.9 % IV SOLN
INTRAVENOUS | Status: DC
  Filled 2014-08-16: qty 30

## 2014-08-16 NOTE — Progress Notes (Signed)
No complaints at present. No CP or SOB  He is extremely anxious and has talked to RN about leaving AMA  BP 139/75 mmHg  Pulse 80  Temp(Src) 97.8 F (36.6 C) (Oral)  Resp 18  Ht 5\' 9"  (1.753 m)  Wt 141 lb 12.8 oz (64.32 kg)  BMI 20.93 kg/m2  SpO2 99%   Intake/Output Summary (Last 24 hours) at 08/16/14 1529 Last data filed at 08/16/14 0600  Gross per 24 hour  Intake    480 ml  Output      0 ml  Net    480 ml    64 yo man in NAD Cardiac RRR Neuro intact  P2Y12 is 151 today c/w continued plavix effect  I had a long discussion with Michael Herrera regarding timing of CABG, including risk of bleeding from Plavix if we proceed v risk of MI or stent thrombosis with delaying to Thursday. He has a good understanding of the issues. I think any additional benefit re: bleeding we would have from delaying surgery is offset by the risk of recurrent cardiac ischemia in the interim, especially in light of the fact that he could sign out AMA. HE is aware that proceeding tomorrow does significantly increase his risk of bleeding, transfusions, although it is difficult to quantify that increase.

## 2014-08-16 NOTE — Progress Notes (Signed)
Patient Name: Michael Herrera Date of Encounter: 08/16/2014  Active Problems:   Angina pectoris   Coronary artery disease involving native coronary artery of native heart without angina pectoris   Hyperlipidemia   Length of Stay: 2  SUBJECTIVE  The patient denies any chest pain or SOB. He has a lot of questions including his suspicion getting Plavix here erroneously "because his P2Y12 should have been up already". He wants to talk to CT surgery and potentially go home if surgery not planned for tomorrow.  He continues being very anxious, he refused to take betablocker as he feels that he can manage without medications, he refused to take xanax for anxiety.   CURRENT MEDS . aspirin  81 mg Oral Daily  . atorvastatin  80 mg Oral q1800  . carvedilol  3.125 mg Oral BID WC  . ezetimibe  10 mg Oral Daily  . heparin  5,000 Units Subcutaneous 3 times per day  . pantoprazole  40 mg Oral Daily   OBJECTIVE  Filed Vitals:   08/15/14 0500 08/15/14 1405 08/15/14 2122 08/16/14 0555  BP: 110/71 111/67 124/76 118/66  Pulse: 79 73 69 72  Temp: 97.3 F (36.3 C) 98.9 F (37.2 C) 97.8 F (36.6 C) 98.2 F (36.8 C)  TempSrc:  Oral Oral Oral  Resp: 12 19 18 18   Height:      Weight: 148 lb 3.2 oz (67.223 kg)   141 lb 12.8 oz (64.32 kg)  SpO2: 99% 95% 99% 97%    Intake/Output Summary (Last 24 hours) at 08/16/14 1041 Last data filed at 08/16/14 0600  Gross per 24 hour  Intake    720 ml  Output      0 ml  Net    720 ml   Filed Weights   08/14/14 0657 08/15/14 0500 08/16/14 0555  Weight: 148 lb (67.132 kg) 148 lb 3.2 oz (67.223 kg) 141 lb 12.8 oz (64.32 kg)    PHYSICAL EXAM  General: Pleasant, NAD. Neuro: Alert and oriented X 3. Moves all extremities spontaneously. Psych: Normal affect. HEENT:  Normal  Neck: Supple without bruits or JVD. Lungs:  Resp regular and unlabored, CTA. Heart: RRR no s3, s4, or murmurs. Abdomen: Soft, non-tender, non-distended, BS + x 4.  Extremities: No  clubbing, cyanosis or edema. DP/PT/Radials 2+ and equal bilaterally.  Accessory Clinical Findings  CBC  Recent Labs  08/14/14 1355  WBC 6.2  HGB 14.3  HCT 41.5  MCV 88.1  PLT 765   Basic Metabolic Panel  Recent Labs  08/14/14 1355  CREATININE 0.97    Radiology/Studies  No results found.  TELE: SR, 70' BPM  Left cath: 08/14/2014 IMPRESSIONS:  1. Short, moderately diseased left main coronary artery. 2. Moderate disease in the ostial left anterior descending artery. Severe disease in the ostial first diagonal branch. Intravascular ultrasound showed a cross-sectional area of 3.5 mm and the ostial LAD. 3. Severe disease in the ostial left circumflex artery extending back to the left main and the ostial LAD. 4. Patent stent in the mid right coronary artery. No significant disease. 5. Normal left ventricular systolic function. LVEDP 3 mmHg. Ejection fraction 55%.  RECOMMENDATION: Progression of the distal left main, ostial LAD and ostial circumflex disease from prior cath. Will obtain cardiac surgery consult. The patient is on Plavix. This is being held. Consider starting heparin after sheath pull. He may need a IIb IIIa inhibitor just before the surgery since his Plavix will be wearing off.  ASSESSMENT AND PLAN  1. CAD - distal LM disease that involves proximal LAD, LCX, diagonal vessel, on cath on 08/14/2013.  He is scheduled for a  CABG the next week, timing depending on platelet inhibition - Friday 143, today 151. We will repeat tomorrow, doubt that surgery will be scheduled for tomorrow. CT surgery paged to further discussed. The patient was explained that Dr [name] is not working this weekend.  I don't feel comfortable to discharge him home with the extend of CAD he has that involves left main disease.  Continue low dose ASA, atorvastatin, zetia, add carvedilol 3.125 mg po BID (refused).   2. Dyslipidemia - last LDL was not at goal but he did not want to  uptitrate his statin and wanted to try diet - continue fish oil/Atorvastatin. He is agreeable to adding Zetia 10mg  daily but we will check with his insurance to see how much his copay will be.  3. BP - controlled  Signed, Dorothy Spark MD, [facility] 08/16/2014

## 2014-08-17 ENCOUNTER — Inpatient Hospital Stay (HOSPITAL_COMMUNITY): Payer: 59 | Admitting: Certified Registered"

## 2014-08-17 ENCOUNTER — Inpatient Hospital Stay (HOSPITAL_COMMUNITY): Payer: 59

## 2014-08-17 ENCOUNTER — Encounter (HOSPITAL_COMMUNITY)
Admission: RE | Disposition: A | Discharge status: Home / Self Care | Payer: 59 | Source: Ambulatory Visit | Attending: Thoracic Surgery (Cardiothoracic Vascular Surgery)

## 2014-08-17 ENCOUNTER — Encounter (HOSPITAL_COMMUNITY): Payer: 59

## 2014-08-17 HISTORY — PX: CORONARY ARTERY BYPASS GRAFT: SHX141

## 2014-08-17 LAB — POCT I-STAT 4, (NA,K, GLUC, HGB,HCT)
Glucose, Bld: 105 mg/dL — ABNORMAL HIGH (ref 70–99)
HCT: 25 % — ABNORMAL LOW (ref 39.0–52.0)
Hemoglobin: 8.5 g/dL — ABNORMAL LOW (ref 13.0–17.0)
Potassium: 3.4 mmol/L — ABNORMAL LOW (ref 3.5–5.1)
Sodium: 141 mmol/L (ref 135–145)

## 2014-08-17 LAB — POCT I-STAT 3, ART BLOOD GAS (G3+)
Acid-Base Excess: 2 mmol/L (ref 0.0–2.0)
Acid-Base Excess: 1 mmol/L (ref 0.0–2.0)
BICARBONATE: 26.4 meq/L — AB (ref 20.0–24.0)
BICARBONATE: 25.6 meq/L — AB (ref 20.0–24.0)
Bicarbonate: 24.4 mEq/L — ABNORMAL HIGH (ref 20.0–24.0)
O2 Saturation: 100 %
O2 Saturation: 100 %
O2 Saturation: 100 %
PCO2 ART: 38.3 mmHg (ref 35.0–45.0)
PCO2 ART: 38.4 mmHg (ref 35.0–45.0)
PCO2 ART: 39.5 mmHg (ref 35.0–45.0)
PH ART: 7.447 (ref 7.350–7.450)
PH ART: 7.432 (ref 7.350–7.450)
TCO2: 28 mmol/L (ref 0–100)
TCO2: 27 mmol/L (ref 0–100)
TCO2: 26 mmol/L (ref 0–100)
pH, Arterial: 7.399 (ref 7.350–7.450)
pO2, Arterial: 387 mmHg — ABNORMAL HIGH (ref 80.0–100.0)
pO2, Arterial: 462 mmHg — ABNORMAL HIGH (ref 80.0–100.0)
pO2, Arterial: 177 mmHg — ABNORMAL HIGH (ref 80.0–100.0)

## 2014-08-17 LAB — POCT I-STAT, CHEM 8
BUN: 15 mg/dL (ref 6–23)
BUN: 14 mg/dL (ref 6–23)
BUN: 12 mg/dL (ref 6–23)
BUN: 12 mg/dL (ref 6–23)
BUN: 13 mg/dL (ref 6–23)
CALCIUM ION: 1.27 mmol/L (ref 1.13–1.30)
CALCIUM ION: 1.06 mmol/L — AB (ref 1.13–1.30)
CHLORIDE: 103 meq/L (ref 96–112)
CHLORIDE: 99 meq/L (ref 96–112)
CREATININE: 0.8 mg/dL (ref 0.50–1.35)
Calcium, Ion: 1.31 mmol/L — ABNORMAL HIGH (ref 1.13–1.30)
Calcium, Ion: 1.1 mmol/L — ABNORMAL LOW (ref 1.13–1.30)
Calcium, Ion: 1.07 mmol/L — ABNORMAL LOW (ref 1.13–1.30)
Chloride: 103 mEq/L (ref 96–112)
Chloride: 101 mEq/L (ref 96–112)
Chloride: 103 mEq/L (ref 96–112)
Creatinine, Ser: 0.7 mg/dL (ref 0.50–1.35)
Creatinine, Ser: 0.7 mg/dL (ref 0.50–1.35)
Creatinine, Ser: 0.6 mg/dL (ref 0.50–1.35)
Creatinine, Ser: 0.7 mg/dL (ref 0.50–1.35)
GLUCOSE: 107 mg/dL — AB (ref 70–99)
Glucose, Bld: 97 mg/dL (ref 70–99)
Glucose, Bld: 83 mg/dL (ref 70–99)
Glucose, Bld: 113 mg/dL — ABNORMAL HIGH (ref 70–99)
Glucose, Bld: 98 mg/dL (ref 70–99)
HCT: 37 % — ABNORMAL LOW (ref 39.0–52.0)
HCT: 31 % — ABNORMAL LOW (ref 39.0–52.0)
HCT: 25 % — ABNORMAL LOW (ref 39.0–52.0)
HEMATOCRIT: 25 % — AB (ref 39.0–52.0)
HEMATOCRIT: 25 % — AB (ref 39.0–52.0)
HEMOGLOBIN: 8.5 g/dL — AB (ref 13.0–17.0)
Hemoglobin: 12.6 g/dL — ABNORMAL LOW (ref 13.0–17.0)
Hemoglobin: 10.5 g/dL — ABNORMAL LOW (ref 13.0–17.0)
Hemoglobin: 8.5 g/dL — ABNORMAL LOW (ref 13.0–17.0)
Hemoglobin: 8.5 g/dL — ABNORMAL LOW (ref 13.0–17.0)
POTASSIUM: 4.1 mmol/L (ref 3.5–5.1)
POTASSIUM: 4.9 mmol/L (ref 3.5–5.1)
Potassium: 4.2 mmol/L (ref 3.5–5.1)
Potassium: 3.8 mmol/L (ref 3.5–5.1)
Potassium: 4 mmol/L (ref 3.5–5.1)
SODIUM: 140 mmol/L (ref 135–145)
SODIUM: 139 mmol/L (ref 135–145)
SODIUM: 138 mmol/L (ref 135–145)
SODIUM: 138 mmol/L (ref 135–145)
Sodium: 139 mmol/L (ref 135–145)
TCO2: 25 mmol/L (ref 0–100)
TCO2: 23 mmol/L (ref 0–100)
TCO2: 25 mmol/L (ref 0–100)
TCO2: 23 mmol/L (ref 0–100)
TCO2: 24 mmol/L (ref 0–100)

## 2014-08-17 LAB — GLUCOSE, CAPILLARY: GLUCOSE-CAPILLARY: 122 mg/dL — AB (ref 70–99)

## 2014-08-17 LAB — CBC
HCT: 28.8 % — ABNORMAL LOW (ref 39.0–52.0)
Hemoglobin: 10 g/dL — ABNORMAL LOW (ref 13.0–17.0)
MCH: 31.1 pg (ref 26.0–34.0)
MCHC: 34.7 g/dL (ref 30.0–36.0)
MCV: 89.4 fL (ref 78.0–100.0)
Platelets: 118 10*3/uL — ABNORMAL LOW (ref 150–400)
RBC: 3.22 MIL/uL — AB (ref 4.22–5.81)
RDW: 12.2 % (ref 11.5–15.5)
WBC: 9.7 10*3/uL (ref 4.0–10.5)

## 2014-08-17 LAB — HEMOGLOBIN A1C
HEMOGLOBIN A1C: 5.6 % (ref <5.7)
Mean Plasma Glucose: 114 mg/dL (ref <117)

## 2014-08-17 LAB — ABO/RH: ABO/RH(D): A POS

## 2014-08-17 LAB — PROTIME-INR
INR: 1.49 (ref 0.00–1.49)
Prothrombin Time: 18.2 seconds — ABNORMAL HIGH (ref 11.6–15.2)

## 2014-08-17 LAB — APTT: APTT: 41 s — AB (ref 24–37)

## 2014-08-17 LAB — PREPARE RBC (CROSSMATCH)

## 2014-08-17 LAB — HEMOGLOBIN AND HEMATOCRIT, BLOOD
HEMATOCRIT: 26.4 % — AB (ref 39.0–52.0)
Hemoglobin: 9.4 g/dL — ABNORMAL LOW (ref 13.0–17.0)

## 2014-08-17 LAB — PLATELET COUNT: Platelets: 133 10*3/uL — ABNORMAL LOW (ref 150–400)

## 2014-08-17 SURGERY — CORONARY ARTERY BYPASS GRAFTING (CABG)
Anesthesia: General | Site: Chest

## 2014-08-17 MED ORDER — VANCOMYCIN HCL IN DEXTROSE 1-5 GM/200ML-% IV SOLN
1000.0000 mg | Freq: Once | INTRAVENOUS | Status: AC
  Administered 2014-08-18: 1000 mg via INTRAVENOUS
  Filled 2014-08-17: qty 200

## 2014-08-17 MED ORDER — BISACODYL 10 MG RE SUPP: 10.0000 mg | Freq: Every day | RECTAL | Status: DC

## 2014-08-17 MED ORDER — MORPHINE SULFATE 2 MG/ML IJ SOLN
2.0000 mg | INTRAMUSCULAR | Status: DC | PRN
  Administered 2014-08-18: 4 mg via INTRAVENOUS
  Filled 2014-08-17: qty 2
  Filled 2014-08-17 (×3): qty 1

## 2014-08-17 MED ORDER — HEMOSTATIC AGENTS (NO CHARGE) OPTIME
TOPICAL | Status: DC | PRN
  Administered 2014-08-17: 1 via TOPICAL

## 2014-08-17 MED ORDER — FENTANYL CITRATE 0.05 MG/ML IJ SOLN
INTRAMUSCULAR | Status: AC
  Filled 2014-08-17: qty 5

## 2014-08-17 MED ORDER — LACTATED RINGERS IV SOLN
INTRAVENOUS | Status: DC | PRN
  Administered 2014-08-17: 12:00:00 via INTRAVENOUS

## 2014-08-17 MED ORDER — SODIUM CHLORIDE 0.9 % IV SOLN: Freq: Once | INTRAVENOUS | Status: DC

## 2014-08-17 MED ORDER — ACETAMINOPHEN 160 MG/5ML PO SOLN: 650.0000 mg | Freq: Once | ORAL | Status: AC

## 2014-08-17 MED ORDER — METOPROLOL TARTRATE 1 MG/ML IV SOLN: 2.5000 mg | INTRAVENOUS | Status: DC | PRN

## 2014-08-17 MED ORDER — METOPROLOL TARTRATE 12.5 MG HALF TABLET
12.5000 mg | ORAL_TABLET | Freq: Two times a day (BID) | ORAL | Status: DC
  Administered 2014-08-19 – 2014-08-20 (×3): 12.5 mg via ORAL
  Filled 2014-08-17 (×9): qty 1

## 2014-08-17 MED ORDER — SODIUM CHLORIDE 0.9 % IJ SOLN: 3.0000 mL | INTRAMUSCULAR | Status: DC | PRN

## 2014-08-17 MED ORDER — DOCUSATE SODIUM 100 MG PO CAPS
200.0000 mg | ORAL_CAPSULE | Freq: Every day | ORAL | Status: DC
  Administered 2014-08-18 – 2014-08-21 (×4): 200 mg via ORAL
  Filled 2014-08-17 (×7): qty 2

## 2014-08-17 MED ORDER — MIDAZOLAM HCL 2 MG/2ML IJ SOLN
INTRAMUSCULAR | Status: AC
  Administered 2014-08-17: 1 mg via INTRAVENOUS
  Administered 2014-08-17: 2 mg via INTRAVENOUS
  Administered 2014-08-17 (×2): 4 mg via INTRAVENOUS
  Administered 2014-08-17: 1 mg via INTRAVENOUS
  Filled 2014-08-17: qty 2

## 2014-08-17 MED ORDER — SODIUM CHLORIDE 0.9 % IV SOLN
INTRAVENOUS | Status: DC
Start: 2014-08-17 — End: 2014-08-19

## 2014-08-17 MED ORDER — CHLORHEXIDINE GLUCONATE 0.12 % MT SOLN
15.0000 mL | Freq: Two times a day (BID) | OROMUCOSAL | Status: DC
  Administered 2014-08-17: 15 mL via OROMUCOSAL
  Filled 2014-08-17: qty 15

## 2014-08-17 MED ORDER — VECURONIUM BROMIDE 10 MG IV SOLR
INTRAVENOUS | Status: AC
  Filled 2014-08-17: qty 20

## 2014-08-17 MED ORDER — PHENYLEPHRINE HCL 10 MG/ML IJ SOLN
10.0000 mg | INTRAMUSCULAR | Status: DC | PRN
  Administered 2014-08-17: 10 ug/min via INTRAVENOUS

## 2014-08-17 MED ORDER — MIDAZOLAM HCL 5 MG/5ML IJ SOLN: INTRAMUSCULAR | Status: DC | PRN

## 2014-08-17 MED ORDER — POTASSIUM CHLORIDE 10 MEQ/50ML IV SOLN
10.0000 meq | Freq: Once | INTRAVENOUS | Status: AC
  Administered 2014-08-17: 10 meq via INTRAVENOUS

## 2014-08-17 MED ORDER — HEPARIN SODIUM (PORCINE) 1000 UNIT/ML IJ SOLN
INTRAMUSCULAR | Status: AC
  Filled 2014-08-17: qty 1

## 2014-08-17 MED ORDER — ACETAMINOPHEN 500 MG PO TABS
1000.0000 mg | ORAL_TABLET | Freq: Four times a day (QID) | ORAL | Status: DC
  Administered 2014-08-17 – 2014-08-22 (×18): 1000 mg via ORAL
  Filled 2014-08-17 (×20): qty 2

## 2014-08-17 MED ORDER — FAMOTIDINE IN NACL 20-0.9 MG/50ML-% IV SOLN: 20.0000 mg | Freq: Two times a day (BID) | INTRAVENOUS | Status: AC

## 2014-08-17 MED ORDER — CETYLPYRIDINIUM CHLORIDE 0.05 % MT LIQD
7.0000 mL | Freq: Four times a day (QID) | OROMUCOSAL | Status: DC
  Administered 2014-08-18 (×2): 7 mL via OROMUCOSAL

## 2014-08-17 MED ORDER — POTASSIUM CHLORIDE 10 MEQ/50ML IV SOLN
10.0000 meq | INTRAVENOUS | Status: AC
  Administered 2014-08-17 (×3): 10 meq via INTRAVENOUS

## 2014-08-17 MED ORDER — MIDAZOLAM HCL 2 MG/2ML IJ SOLN: 2.0000 mg | INTRAMUSCULAR | Status: DC | PRN

## 2014-08-17 MED ORDER — PROTAMINE SULFATE 10 MG/ML IV SOLN
INTRAVENOUS | Status: AC
  Filled 2014-08-17: qty 10

## 2014-08-17 MED ORDER — PHENYLEPHRINE 40 MCG/ML (10ML) SYRINGE FOR IV PUSH (FOR BLOOD PRESSURE SUPPORT)
PREFILLED_SYRINGE | INTRAVENOUS | Status: AC
  Filled 2014-08-17: qty 10

## 2014-08-17 MED ORDER — EPHEDRINE SULFATE 50 MG/ML IJ SOLN
INTRAMUSCULAR | Status: AC
  Filled 2014-08-17: qty 1

## 2014-08-17 MED ORDER — CHLORHEXIDINE GLUCONATE 4 % EX LIQD
Freq: Once | CUTANEOUS | Status: DC
  Filled 2014-08-17: qty 30

## 2014-08-17 MED ORDER — DEXMEDETOMIDINE HCL IN NACL 200 MCG/50ML IV SOLN
0.0000 ug/kg/h | INTRAVENOUS | Status: DC
  Administered 2014-08-17: 0.7 ug/kg/h via INTRAVENOUS
  Filled 2014-08-17: qty 50

## 2014-08-17 MED ORDER — ACETAMINOPHEN 650 MG RE SUPP
650.0000 mg | Freq: Once | RECTAL | Status: AC
  Administered 2014-08-17: 650 mg via RECTAL

## 2014-08-17 MED ORDER — SODIUM CHLORIDE 0.9 % IV SOLN: 250.0000 mL | INTRAVENOUS | Status: DC

## 2014-08-17 MED ORDER — TRAMADOL HCL 50 MG PO TABS
50.0000 mg | ORAL_TABLET | ORAL | Status: DC | PRN
  Administered 2014-08-19 – 2014-08-21 (×3): 50 mg via ORAL
  Filled 2014-08-17 (×3): qty 1

## 2014-08-17 MED ORDER — ROCURONIUM BROMIDE 100 MG/10ML IV SOLN
INTRAVENOUS | Status: DC | PRN
  Administered 2014-08-17: 50 mg via INTRAVENOUS

## 2014-08-17 MED ORDER — SODIUM CHLORIDE 0.9 % IV SOLN
INTRAVENOUS | Status: DC
  Filled 2014-08-17: qty 2.5

## 2014-08-17 MED ORDER — LIDOCAINE HCL (CARDIAC) 20 MG/ML IV SOLN
INTRAVENOUS | Status: AC
  Filled 2014-08-17: qty 5

## 2014-08-17 MED ORDER — ACETAMINOPHEN 160 MG/5ML PO SOLN
1000.0000 mg | Freq: Four times a day (QID) | ORAL | Status: DC
  Filled 2014-08-17: qty 40

## 2014-08-17 MED ORDER — ASPIRIN 81 MG PO CHEW: 324.0000 mg | CHEWABLE_TABLET | Freq: Every day | ORAL | Status: DC

## 2014-08-17 MED ORDER — ALBUMIN HUMAN 5 % IV SOLN
250.0000 mL | INTRAVENOUS | Status: AC | PRN
  Administered 2014-08-17 (×4): 250 mL via INTRAVENOUS
  Filled 2014-08-17 (×3): qty 250

## 2014-08-17 MED ORDER — PANTOPRAZOLE SODIUM 40 MG PO TBEC
40.0000 mg | DELAYED_RELEASE_TABLET | Freq: Every day | ORAL | Status: DC
  Administered 2014-08-19 – 2014-08-22 (×4): 40 mg via ORAL
  Filled 2014-08-17 (×4): qty 1

## 2014-08-17 MED ORDER — BISACODYL 5 MG PO TBEC
10.0000 mg | DELAYED_RELEASE_TABLET | Freq: Every day | ORAL | Status: DC
  Administered 2014-08-18 – 2014-08-21 (×4): 10 mg via ORAL
  Filled 2014-08-17 (×4): qty 2

## 2014-08-17 MED ORDER — CLOPIDOGREL BISULFATE 75 MG PO TABS
75.0000 mg | ORAL_TABLET | Freq: Every day | ORAL | Status: DC
  Filled 2014-08-17: qty 1

## 2014-08-17 MED ORDER — MORPHINE SULFATE 2 MG/ML IJ SOLN
1.0000 mg | INTRAMUSCULAR | Status: AC | PRN
  Administered 2014-08-17 – 2014-08-18 (×3): 2 mg via INTRAVENOUS

## 2014-08-17 MED ORDER — LACTATED RINGERS IV SOLN
500.0000 mL | Freq: Once | INTRAVENOUS | Status: AC | PRN
Start: 2014-08-17 — End: 2014-08-17

## 2014-08-17 MED ORDER — NITROGLYCERIN IN D5W 200-5 MCG/ML-% IV SOLN: 0.0000 ug/min | INTRAVENOUS | Status: DC

## 2014-08-17 MED ORDER — METOPROLOL TARTRATE 25 MG/10 ML ORAL SUSPENSION
12.5000 mg | Freq: Two times a day (BID) | ORAL | Status: DC
  Filled 2014-08-17 (×9): qty 5

## 2014-08-17 MED ORDER — MIDAZOLAM HCL 10 MG/2ML IJ SOLN
INTRAMUSCULAR | Status: AC
  Filled 2014-08-17: qty 2

## 2014-08-17 MED ORDER — ALBUMIN HUMAN 5 % IV SOLN
INTRAVENOUS | Status: DC | PRN
  Administered 2014-08-17: 16:00:00 via INTRAVENOUS

## 2014-08-17 MED ORDER — PROTAMINE SULFATE 10 MG/ML IV SOLN
INTRAVENOUS | Status: DC | PRN
  Administered 2014-08-17: 10 mg via INTRAVENOUS
  Administered 2014-08-17: 100 mg via INTRAVENOUS
  Administered 2014-08-17: 90 mg via INTRAVENOUS
  Administered 2014-08-17: 50 mg via INTRAVENOUS

## 2014-08-17 MED ORDER — ROCURONIUM BROMIDE 50 MG/5ML IV SOLN
INTRAVENOUS | Status: AC
  Filled 2014-08-17: qty 1

## 2014-08-17 MED ORDER — PHENYLEPHRINE HCL 10 MG/ML IJ SOLN
0.0000 ug/min | INTRAVENOUS | Status: DC
  Administered 2014-08-17: 30 ug/min via INTRAVENOUS
  Administered 2014-08-18: 25 ug/min via INTRAVENOUS
  Administered 2014-08-18: 45 ug/min via INTRAVENOUS
  Filled 2014-08-17 (×3): qty 2

## 2014-08-17 MED ORDER — MAGNESIUM SULFATE 4 GM/100ML IV SOLN
4.0000 g | Freq: Once | INTRAVENOUS | Status: AC
  Administered 2014-08-17: 4 g via INTRAVENOUS
  Filled 2014-08-17: qty 100

## 2014-08-17 MED ORDER — SODIUM CHLORIDE 0.9 % IJ SOLN
3.0000 mL | Freq: Two times a day (BID) | INTRAMUSCULAR | Status: DC
  Administered 2014-08-18 – 2014-08-19 (×3): 3 mL via INTRAVENOUS

## 2014-08-17 MED ORDER — SODIUM CHLORIDE 0.9 % IJ SOLN
INTRAMUSCULAR | Status: AC
  Filled 2014-08-17: qty 30

## 2014-08-17 MED ORDER — OXYCODONE HCL 5 MG PO TABS
5.0000 mg | ORAL_TABLET | ORAL | Status: DC | PRN
  Administered 2014-08-18 – 2014-08-19 (×4): 5 mg via ORAL
  Filled 2014-08-17 (×4): qty 1

## 2014-08-17 MED ORDER — FENTANYL CITRATE 0.05 MG/ML IJ SOLN
INTRAMUSCULAR | Status: AC
  Administered 2014-08-17: 700 ug via INTRAVENOUS
  Administered 2014-08-17: 100 ug via INTRAVENOUS
  Administered 2014-08-17: 50 ug via INTRAVENOUS
  Administered 2014-08-17: 100 ug via INTRAVENOUS
  Administered 2014-08-17: 50 ug via INTRAVENOUS
  Administered 2014-08-17: 100 ug via INTRAVENOUS
  Administered 2014-08-17: 250 ug via INTRAVENOUS
  Administered 2014-08-17: 150 ug via INTRAVENOUS
  Administered 2014-08-17: 100 ug via INTRAVENOUS
  Filled 2014-08-17: qty 2

## 2014-08-17 MED ORDER — INSULIN ASPART 100 UNIT/ML ~~LOC~~ SOLN
0.0000 [IU] | SUBCUTANEOUS | Status: DC
  Administered 2014-08-18 (×2): 2 [IU] via SUBCUTANEOUS

## 2014-08-17 MED ORDER — VECURONIUM BROMIDE 10 MG IV SOLR
INTRAVENOUS | Status: DC | PRN
  Administered 2014-08-17 (×2): 6 mg via INTRAVENOUS

## 2014-08-17 MED ORDER — DEXTROSE 5 % IV SOLN
1.5000 g | Freq: Two times a day (BID) | INTRAVENOUS | Status: DC
  Administered 2014-08-18 – 2014-08-19 (×3): 1.5 g via INTRAVENOUS
  Filled 2014-08-17 (×4): qty 1.5

## 2014-08-17 MED ORDER — DEXMEDETOMIDINE HCL IN NACL 200 MCG/50ML IV SOLN
INTRAVENOUS | Status: AC
  Filled 2014-08-17: qty 50

## 2014-08-17 MED ORDER — 0.9 % SODIUM CHLORIDE (POUR BTL) OPTIME
TOPICAL | Status: DC | PRN
  Administered 2014-08-17: 1000 mL

## 2014-08-17 MED ORDER — PROPOFOL 10 MG/ML IV BOLUS
INTRAVENOUS | Status: DC | PRN
  Administered 2014-08-17: 50 mg via INTRAVENOUS

## 2014-08-17 MED ORDER — ASPIRIN EC 325 MG PO TBEC
325.0000 mg | DELAYED_RELEASE_TABLET | Freq: Every day | ORAL | Status: DC
  Administered 2014-08-18 – 2014-08-19 (×2): 325 mg via ORAL
  Filled 2014-08-17 (×3): qty 1

## 2014-08-17 MED ORDER — SODIUM CHLORIDE 0.9 % IJ SOLN
OROMUCOSAL | Status: DC | PRN
  Administered 2014-08-17: 1 mL via TOPICAL

## 2014-08-17 MED ORDER — ONDANSETRON HCL 4 MG/2ML IJ SOLN
4.0000 mg | Freq: Four times a day (QID) | INTRAMUSCULAR | Status: DC | PRN
  Administered 2014-08-18 – 2014-08-19 (×3): 4 mg via INTRAVENOUS
  Filled 2014-08-17 (×4): qty 2

## 2014-08-17 MED ORDER — LACTATED RINGERS IV SOLN: INTRAVENOUS | Status: DC

## 2014-08-17 MED ORDER — PROPOFOL 10 MG/ML IV BOLUS
INTRAVENOUS | Status: AC
  Filled 2014-08-17: qty 20

## 2014-08-17 MED ORDER — HEPARIN SODIUM (PORCINE) 1000 UNIT/ML IJ SOLN
INTRAMUSCULAR | Status: DC | PRN
  Administered 2014-08-17: 25000 [IU] via INTRAVENOUS

## 2014-08-17 MED ORDER — INSULIN REGULAR BOLUS VIA INFUSION
0.0000 [IU] | Freq: Three times a day (TID) | INTRAVENOUS | Status: DC
  Filled 2014-08-17: qty 10

## 2014-08-17 MED ORDER — SODIUM CHLORIDE 0.45 % IV SOLN: INTRAVENOUS | Status: DC

## 2014-08-17 SURGICAL SUPPLY — 93 items
ADH SKN CLS LQ APL DERMABOND (GAUZE/BANDAGES/DRESSINGS) ×1
ATTRACTOMAT 16X20 MAGNETIC DRP (DRAPES) ×3 IMPLANT
BAG DECANTER FOR FLEXI CONT (MISCELLANEOUS) ×3 IMPLANT
BANDAGE ELASTIC 4 VELCRO ST LF (GAUZE/BANDAGES/DRESSINGS) ×3 IMPLANT
BANDAGE ELASTIC 6 VELCRO ST LF (GAUZE/BANDAGES/DRESSINGS) ×3 IMPLANT
BASKET HEART  (ORDER IN 25'S) (MISCELLANEOUS) ×1
BASKET HEART (ORDER IN 25'S) (MISCELLANEOUS) ×1
BASKET HEART (ORDER IN 25S) (MISCELLANEOUS) ×1 IMPLANT
BLADE STERNUM SYSTEM 6 (BLADE) ×3 IMPLANT
BLADE SURG 11 STRL SS (BLADE) ×3 IMPLANT
BNDG GAUZE ELAST 4 BULKY (GAUZE/BANDAGES/DRESSINGS) ×3 IMPLANT
CANISTER SUCTION 2500CC (MISCELLANEOUS) ×3 IMPLANT
CANNULA EZ GLIDE AORTIC 21FR (CANNULA) ×3 IMPLANT
CARDIAC SUCTION (MISCELLANEOUS) ×3 IMPLANT
CATH CPB KIT HENDRICKSON (MISCELLANEOUS) ×3 IMPLANT
CATH ROBINSON RED A/P 18FR (CATHETERS) ×3 IMPLANT
CATH THORACIC 36FR (CATHETERS) ×3 IMPLANT
CATH THORACIC 36FR RT ANG (CATHETERS) ×3 IMPLANT
CLIP TI MEDIUM 24 (CLIP) IMPLANT
CLIP TI WIDE RED SMALL 24 (CLIP) ×6 IMPLANT
COVER SURGICAL LIGHT HANDLE (MISCELLANEOUS) ×3 IMPLANT
CRADLE DONUT ADULT HEAD (MISCELLANEOUS) ×3 IMPLANT
DERMABOND ADHESIVE PROPEN (GAUZE/BANDAGES/DRESSINGS) ×2
DERMABOND ADVANCED .7 DNX6 (GAUZE/BANDAGES/DRESSINGS) ×1 IMPLANT
DRAPE CARDIOVASCULAR INCISE (DRAPES) ×3
DRAPE PROXIMA HALF (DRAPES) ×3 IMPLANT
DRAPE SLUSH/WARMER DISC (DRAPES) ×3 IMPLANT
DRAPE SRG 135X102X78XABS (DRAPES) ×1 IMPLANT
DRSG COVADERM 4X14 (GAUZE/BANDAGES/DRESSINGS) ×3 IMPLANT
ELECT REM PT RETURN 9FT ADLT (ELECTROSURGICAL) ×6
ELECTRODE REM PT RTRN 9FT ADLT (ELECTROSURGICAL) ×2 IMPLANT
GAUZE SPONGE 4X4 12PLY STRL (GAUZE/BANDAGES/DRESSINGS) ×6 IMPLANT
GLOVE BIOGEL M 6.5 STRL (GLOVE) ×6 IMPLANT
GLOVE BIOGEL M 7.0 STRL (GLOVE) ×6 IMPLANT
GLOVE BIOGEL M STER SZ 6 (GLOVE) ×3 IMPLANT
GLOVE BIOGEL PI IND STRL 7.0 (GLOVE) ×7 IMPLANT
GLOVE BIOGEL PI INDICATOR 7.0 (GLOVE) ×14
GLOVE SURG SIGNA 7.5 PF LTX (GLOVE) ×9 IMPLANT
GOWN STRL REUS W/ TWL LRG LVL3 (GOWN DISPOSABLE) ×7 IMPLANT
GOWN STRL REUS W/ TWL XL LVL3 (GOWN DISPOSABLE) ×2 IMPLANT
GOWN STRL REUS W/TWL LRG LVL3 (GOWN DISPOSABLE) ×21
GOWN STRL REUS W/TWL XL LVL3 (GOWN DISPOSABLE) ×6
HEMOSTAT POWDER SURGIFOAM 1G (HEMOSTASIS) ×9 IMPLANT
HEMOSTAT SURGICEL 2X14 (HEMOSTASIS) ×3 IMPLANT
INSERT FOGARTY XLG (MISCELLANEOUS) IMPLANT
KIT BASIN OR (CUSTOM PROCEDURE TRAY) ×3 IMPLANT
KIT ROOM TURNOVER OR (KITS) ×3 IMPLANT
KIT SUCTION CATH 14FR (SUCTIONS) ×6 IMPLANT
KIT VASOVIEW W/TROCAR VH 2000 (KITS) ×3 IMPLANT
MARKER GRAFT CORONARY BYPASS (MISCELLANEOUS) ×9 IMPLANT
NS IRRIG 1000ML POUR BTL (IV SOLUTION) ×15 IMPLANT
PACK OPEN HEART (CUSTOM PROCEDURE TRAY) ×3 IMPLANT
PAD ARMBOARD 7.5X6 YLW CONV (MISCELLANEOUS) ×6 IMPLANT
PAD ELECT DEFIB RADIOL ZOLL (MISCELLANEOUS) ×3 IMPLANT
PENCIL BUTTON HOLSTER BLD 10FT (ELECTRODE) ×5 IMPLANT
PUNCH AORTIC ROTATE  4.5MM 8IN (MISCELLANEOUS) ×3 IMPLANT
PUNCH AORTIC ROTATE 4.0MM (MISCELLANEOUS) IMPLANT
PUNCH AORTIC ROTATE 4.5MM 8IN (MISCELLANEOUS) IMPLANT
PUNCH AORTIC ROTATE 5MM 8IN (MISCELLANEOUS) IMPLANT
SET CARDIOPLEGIA MPS 5001102 (MISCELLANEOUS) ×3 IMPLANT
SPONGE GAUZE 4X4 12PLY STER LF (GAUZE/BANDAGES/DRESSINGS) ×4 IMPLANT
SPONGE LAP 18X18 X RAY DECT (DISPOSABLE) ×2 IMPLANT
SUT BONE WAX W31G (SUTURE) ×3 IMPLANT
SUT MNCRL AB 4-0 PS2 18 (SUTURE) ×2 IMPLANT
SUT PROLENE 3 0 SH DA (SUTURE) ×3 IMPLANT
SUT PROLENE 4 0 RB 1 (SUTURE)
SUT PROLENE 4 0 SH DA (SUTURE) IMPLANT
SUT PROLENE 4-0 RB1 .5 CRCL 36 (SUTURE) IMPLANT
SUT PROLENE 6 0 C 1 30 (SUTURE) ×10 IMPLANT
SUT PROLENE 7 0 BV1 MDA (SUTURE) ×5 IMPLANT
SUT PROLENE 8 0 BV175 6 (SUTURE) ×2 IMPLANT
SUT STEEL 6MS V (SUTURE) ×3 IMPLANT
SUT STEEL STERNAL CCS#1 18IN (SUTURE) IMPLANT
SUT STEEL SZ 6 DBL 3X14 BALL (SUTURE) ×3 IMPLANT
SUT VIC AB 1 CTX 36 (SUTURE) ×6
SUT VIC AB 1 CTX36XBRD ANBCTR (SUTURE) ×2 IMPLANT
SUT VIC AB 2-0 CT1 27 (SUTURE) ×3
SUT VIC AB 2-0 CT1 TAPERPNT 27 (SUTURE) ×1 IMPLANT
SUT VIC AB 2-0 CTX 27 (SUTURE) IMPLANT
SUT VIC AB 3-0 SH 27 (SUTURE)
SUT VIC AB 3-0 SH 27X BRD (SUTURE) IMPLANT
SUT VIC AB 3-0 X1 27 (SUTURE) IMPLANT
SUT VICRYL 4-0 PS2 18IN ABS (SUTURE) IMPLANT
SUTURE E-PAK OPEN HEART (SUTURE) ×3 IMPLANT
SYSTEM SAHARA CHEST DRAIN ATS (WOUND CARE) ×3 IMPLANT
TAPE CLOTH SURG 4X10 WHT LF (GAUZE/BANDAGES/DRESSINGS) ×6 IMPLANT
TOWEL OR 17X24 6PK STRL BLUE (TOWEL DISPOSABLE) ×6 IMPLANT
TOWEL OR 17X26 10 PK STRL BLUE (TOWEL DISPOSABLE) ×6 IMPLANT
TRAY FOLEY IC TEMP SENS 16FR (CATHETERS) ×3 IMPLANT
TUBE FEEDING 8FR 16IN STR KANG (MISCELLANEOUS) ×3 IMPLANT
TUBING INSUFFLATION (TUBING) ×3 IMPLANT
UNDERPAD 30X30 INCONTINENT (UNDERPADS AND DIAPERS) ×3 IMPLANT
WATER STERILE IRR 1000ML POUR (IV SOLUTION) ×6 IMPLANT

## 2014-08-17 NOTE — Transfer of Care (Signed)
Immediate Anesthesia Transfer of Care Note  Patient: Michael Herrera  Procedure(s) Performed: Procedure(s) with comments: CORONARY ARTERY BYPASS GRAFTING (CABG) (N/A) - Times three using left internal mammary artery and endoscopically harvested right saphenous vein  Patient Location: SICU  Anesthesia Type:General  Level of Consciousness: Patient remains intubated per anesthesia plan  Airway & Oxygen Therapy: Patient remains intubated per anesthesia plan and Patient placed on Ventilator (see vital sign flow sheet for setting)  Post-op Assessment: Report given to PACU RN and Post -op Vital signs reviewed and stable  Post vital signs: Reviewed and stable  Complications: No apparent anesthesia complications

## 2014-08-17 NOTE — Progress Notes (Addendum)
Report called to Sharmaine Base, RN

## 2014-08-17 NOTE — Interval H&P Note (Signed)
History and Physical Interval Note: See yesterday's note for update 08/17/2014 11:32 AM  Michael Herrera  has presented today for surgery, with the diagnosis of CAD  The various methods of treatment have been discussed with the patient and family. After consideration of risks, benefits and other options for treatment, the patient has consented to  Procedure(s): CORONARY ARTERY BYPASS GRAFTING (CABG) (N/A) as a surgical intervention .  The patient's history has been reviewed, patient examined, no change in status, stable for surgery.  I have reviewed the patient's chart and labs.  Questions were answered to the patient's satisfaction.     HENDRICKSON,STEVEN C

## 2014-08-17 NOTE — OR Nursing (Signed)
First call to SICU at 1617.

## 2014-08-17 NOTE — Anesthesia Postprocedure Evaluation (Signed)
  Anesthesia Post-op Note  Patient: Michael Herrera  Procedure(s) Performed: Procedure(s) with comments: CORONARY ARTERY BYPASS GRAFTING (CABG) (N/A) - Times three using left internal mammary artery and endoscopically harvested right saphenous vein  Patient Location: SICU  Anesthesia Type:General  Level of Consciousness: sedated and Patient remains intubated per anesthesia plan  Airway and Oxygen Therapy: Patient remains intubated per anesthesia plan and Patient placed on Ventilator (see vital sign flow sheet for setting)  Post-op Pain: none  Post-op Assessment: Post-op Vital signs reviewed, Patient's Cardiovascular Status Stable, Respiratory Function Stable, Patent Airway, No signs of Nausea or vomiting and Pain level controlled  Post-op Vital Signs: stable  Last Vitals:  Filed Vitals:   08/17/14 1745  BP:   Pulse: 87  Temp: 35.8 C  Resp: 14    Complications: No apparent anesthesia complications

## 2014-08-17 NOTE — OR Nursing (Signed)
Second call to SICU at 1646.

## 2014-08-17 NOTE — Anesthesia Preprocedure Evaluation (Signed)
Anesthesia Evaluation  Patient identified by MRN, date of birth, ID band Patient awake    Reviewed: Allergy & Precautions, NPO status , Patient's Chart, lab work & pertinent test results  Airway Mallampati: II  TM Distance: >3 FB Neck ROM: Full    Dental  (+) Teeth Intact, Dental Advisory Given   Pulmonary  breath sounds clear to auscultation        Cardiovascular Rhythm:Regular Rate:Normal     Neuro/Psych    GI/Hepatic   Endo/Other    Renal/GU      Musculoskeletal   Abdominal   Peds  Hematology   Anesthesia Other Findings   Reproductive/Obstetrics                             Anesthesia Physical Anesthesia Plan  ASA: III  Anesthesia Plan: General   Post-op Pain Management:    Induction: Intravenous  Airway Management Planned: Oral ETT  Additional Equipment: Arterial line, PA Cath and CVP  Intra-op Plan:   Post-operative Plan:   Informed Consent: I have reviewed the patients History and Physical, chart, labs and discussed the procedure including the risks, benefits and alternatives for the proposed anesthesia with the patient or authorized representative who has indicated his/her understanding and acceptance.   Dental advisory given  Plan Discussed with: CRNA and Anesthesiologist  Anesthesia Plan Comments:         Anesthesia Quick Evaluation

## 2014-08-17 NOTE — Brief Op Note (Addendum)
08/14/2014 - 08/17/2014  3:48 PM  PATIENT:  Michael Herrera  64 y.o. male  PRE-OPERATIVE DIAGNOSIS:  CAD  POST-OPERATIVE DIAGNOSIS:  CAD  PROCEDURE:  MEDIAN STERNOTOMY CORONARY ARTERY BYPASS GRAFTING (CABG) x 3  LIMA to LAD  SVG to DIAGONAL 2  SVG to OM1 Endoscopic vein harvest right thigh saphenous vein  SURGEON:  Surgeon(s) and Role:    * Melrose Nakayama, MD - Primary  PHYSICIAN ASSISTANT: Lars Pinks PA-C  ANESTHESIA:   general  EBL:  Total I/O In: 2300 [I.V.:2300] Out: 100 [Urine:100]  DRAINS: Chest tubes in the mediastinal space and left pleura  COUNTS CORRECT:  YES  PLAN OF CARE: Admit to inpatient   PATIENT DISPOSITION:  ICU - intubated and hemodynamically stable.   Delay start of Pharmacological VTE agent (>24hrs) due to surgical blood loss or risk of bleeding: yes  BASELINE WEIGHT: 63 kg  XC= 55 min CPB= 86 min Good conduits LAD and OM1 good targets, Diagonal fair target OM2 too small to graft

## 2014-08-17 NOTE — H&P (View-Only) (Signed)
Reason for Consult: Left main and 2 vessel CAD Referring Physician: Dr. Tora Perches  STEVENSON WINDMILLER is an 64 y.o. male.  HPI: 64 yo man with a cc/o chest pain  Mr. Lanuza is a 64 yo man with a history dyslipidemia and of CAD dating back to 2001. He had stents placed in the RCA in 2001. He did well until March of 2015 when he had recurrent angina. A stress test was unremarkable but he had persistent symptoms and underwent catheterization. He had a 90% proximal LAD stenosis and a DES was placed. He again did well after that procedure until a couple of weeks ago, when had recurrent exertional angina. He describes this as a dull pain that begins in his chest and radiates to his back and his teeth. He has been having symptoms daily. It has been relieved by rest and is not associated with nausea, vomiting, shortness of breath, or diapheresis.  He was scheduled for cardiac catheterization and that was performed today. He was found to have progression of his left main disease and a critical lesion at the takeoff of the circumflex. His LAD and right coronary stents are patent. He does have stenoses in a first diagonal branch and his first obtuse marginal, which were present previously. His ejection fraction was 55%.  Mr. Navarra denies any significant family history of CAD. He does have a history of dyslipidemia. He denies hypertension. He is a lifelong nonsmoker. He eats a heart healthy diet and exercises on a daily basis, walking 2-3 miles a day. He is retired Air[employer] pilot.  Past Medical History  Diagnosis Date  . GERD (gastroesophageal reflux disease)   . Hypercholesteremia   . Low back pain   . Coronary atherosclerosis of native coronary artery 2001    a. s/p PCI with  3.0 x 32 Promus DES mLAD. Ca L main w/o sig obst, mod dz in mLCx. Moderate focal lesion in OM1. Mild to mod dz & patent stents in RCA  . Skin cancer of face   . History of blood transfusion 1956  . Dyslipidemia     Past Surgical  History  Procedure Laterality Date  . Coronary angioplasty with stent placement  2001; 10/29/2013    PCI of RCA "X 2"; PCI of LAD  . Tonsillectomy and adenoidectomy  ~ 1956  . Knee arthroscopy Right 1980's  . Knee arthroscopy Left 2007; 2008  . Refractive surgery Bilateral ?2007  . Skin cancer excision Left     "cheek"  . Left heart catheterization with coronary angiogram N/A 10/29/2013    Procedure: LEFT HEART CATHETERIZATION WITH CORONARY ANGIOGRAM;  Surgeon: Jettie Booze, MD;  Location: [facility] CATH LAB;  Service: Cardiovascular;  Laterality: N/A;    Family History  Problem Relation Age of Onset  . Dementia Father     Social History:  reports that he has never smoked. He has never used smokeless tobacco. He reports that he drinks about 1.8 oz of alcohol per week. He reports that he does not use illicit drugs.  Allergies: No Known Allergies  Medications:  Prior to Admission:  Prescriptions prior to admission  Medication Sig Dispense Refill Last Dose  . aspirin 81 MG tablet Take 81 mg by mouth every evening.    08/13/2014 at 2000  . atorvastatin (LIPITOR) 80 MG tablet Take 1 tablet (80 mg total) by mouth daily. (Patient taking differently: Take 80 mg by mouth every evening. ) 90 tablet 3 08/13/2014 at 2000  . clopidogrel (PLAVIX)  75 MG tablet Take 1 tablet (75 mg total) by mouth daily with breakfast. STOP MARCH 2016 90 tablet 3 08/13/2014 at 0800  . diclofenac (VOLTAREN) 75 MG EC tablet Take 75 mg by mouth daily as needed (back pain).    Past Month at Unknown time  . esomeprazole (NEXIUM) 20 MG capsule Take 20 mg by mouth every morning.    08/14/2014 at 0515  . nitroGLYCERIN (NITROSTAT) 0.4 MG SL tablet Place 0.4 mg under the tongue every 5 (five) minutes as needed for chest pain.   08/09/2014  . Omega-3 Fatty Acids (FISH OIL) 1200 MG CAPS Take 1,200 mg by mouth every morning.   08/14/2014 at Unknown time  . vitamin C (ASCORBIC ACID) 500 MG tablet Take 500 mg by mouth every morning.     08/13/2014 at [place]  . vitamin E 400 UNIT capsule Take 400 Units by mouth every morning.    08/14/2014 at 0515  . ezetimibe (ZETIA) 10 MG tablet Take 1 tablet (10 mg total) by mouth daily. (Patient not taking: Reported on 08/14/2014) 90 tablet 3 Not Taking at Unknown time    Results for orders placed or performed during the hospital encounter of 08/14/14 (from the past 48 hour(s))  CBC     Status: None   Collection Time: 08/14/14  1:55 PM  Result Value Ref Range   WBC 6.2 4.0 - 10.5 K/uL   RBC 4.71 4.22 - 5.81 MIL/uL   Hemoglobin 14.3 13.0 - 17.0 g/dL   HCT 41.5 39.0 - 52.0 %   MCV 88.1 78.0 - 100.0 fL   MCH 30.4 26.0 - 34.0 pg   MCHC 34.5 30.0 - 36.0 g/dL   RDW 11.9 11.5 - 15.5 %   Platelets 166 150 - 400 K/uL  Creatinine, serum     Status: Abnormal   Collection Time: 08/14/14  1:55 PM  Result Value Ref Range   Creatinine, Ser 0.97 0.50 - 1.35 mg/dL   GFR calc non Af Amer 85 (L) >90 mL/min   GFR calc Af Amer >90 >90 mL/min    Comment: (NOTE) The eGFR has been calculated using the CKD EPI equation. This calculation has not been validated in all clinical situations. eGFR's persistently <90 mL/min signify possible Chronic Kidney Disease.     No results found.  Review of Systems  Constitutional: Negative for fever, chills and malaise/fatigue.  Cardiovascular: Positive for chest pain.  Gastrointestinal: Positive for heartburn.  Neurological: Negative for weakness.  All other systems reviewed and are negative.  Blood pressure 121/70, pulse 85, temperature 98.6 F (37 C), temperature source Oral, resp. rate 14, height 5' 9" (1.753 m), weight 148 lb (67.132 kg), SpO2 97 %. Physical Exam  Vitals reviewed. Constitutional: He is oriented to person, place, and time. He appears well-developed and well-nourished. No distress.  HENT:  Head: Normocephalic and atraumatic.  Eyes: EOM are normal. Pupils are equal, round, and reactive to light.  Neck: Neck supple. No thyromegaly present.  No  bruits  Cardiovascular: Normal rate, regular rhythm, normal heart sounds and intact distal pulses.  Exam reveals no gallop and no friction rub.   No murmur heard. Respiratory: Breath sounds normal. He has no wheezes. He has no rales.  GI: Soft. Bowel sounds are normal. There is no tenderness.  Musculoskeletal: He exhibits no edema.  Lymphadenopathy:    He has no cervical adenopathy.  Neurological: He is alert and oriented to person, place, and time. No cranial nerve deficit.  No focal deficit  Skin: Skin is warm and dry.      CARDIAC CATHETERIZATION HEMODYNAMICS: Aortic pressure was 114/64; LV pressure was 111/0; LVEDP 3. There was no gradient between the left ventricle and aorta.   ANGIOGRAPHIC DATA: The left main coronary artery is very short and appears moderately diseased.  The left anterior descending artery is a large vessel. THe ostium has moderate calcific disease. The stent is widely patent. The first diagonal has an ostial 80% stenosis.  The left circumflex artery is a large vessel with a 90% ostial stenosis. The OM1 has a 50% stenosis. The remainder of the circumflex is patent.  The right coronary artery is a large dominant vessel. The mid vessel stent is widely patent. Posterior lateral artery and posterior descending artery are large and widely patent.  LEFT VENTRICULOGRAM: Left ventricular angiogram was done in the 30 RAO projection and revealed normal left ventricular wall motion and systolic function with an estimated ejection fraction of 55%. LVEDP was 3 mmHg.  INTRAVASCULAR ULTRASOUND: An 5 [place] EBU 3 guiding catheter was used to engage the left main. Heparin was given for anticoagulation. ACT was used to check that the heparin was therapeutic. Pro-water was placed into the LAD. A 5 French IVUS catheter was advanced to the proximal LAD. Pullback was performed. This showed severe, calcific disease of the ostial LAD. The cross-sectional area was 3.5  m.  The wire and catheter removed. Final angiography showed TIMI-3 flow in both the LAD and the circumflex. This did better show the lesion being in the very ostium of the circumflex and involving the distal left main and ostial LAD.  IMPRESSIONS:  1. Short, moderately diseased left main coronary artery. 2. Moderate disease in the ostial left anterior descending artery. Severe disease in the ostial first diagonal branch. Intravascular ultrasound showed a cross-sectional area of 3.5 mm and the ostial LAD. 3. Severe disease in the ostial left circumflex artery extending back to the left main and the ostial LAD. 4. Patent stent in the mid right coronary artery. No significant disease. 5. Normal left ventricular systolic function. LVEDP 3 mmHg. Ejection fraction 55%.  RECOMMENDATION: Progression of the distal left main, ostial LAD and ostial circumflex disease from prior cath. Will obtain cardiac surgery consult. The patient is on Plavix. This is being held. Consider starting heparin after sheath pull. He may need a IIb IIIa inhibitor just before the surgery since his Plavix will be wearing off.]  Assessment/Plan: 64 year old man with long history of coronary disease who has exertional angina. A catheterization he has left main and two-vessel coronary disease with a tight stenosis involving the takeoff of his left circumflex. Coronary bypass grafting is indicated for survival benefit as well as relief of symptoms.  I discussed  the general nature of the procedure, the need for general anesthesia, and the incisions to be used with Mr. Mcmichael and his family. We discussed the expected hospital stay, overall recovery and short and long term outcomes. I reviewed the indications, risks, benefits and alternatives. They understand the risks include, but are not limited to death, stroke, MI, DVT/PE, bleeding, possible need for transfusion, infections, cardiac arrhythmias, as well as other organ system  dysfunction including respiratory, renal, or GI complications.   He wishes to proceed with CABG.  He has been on Plavix. We discussed the recommended waiting time of 5-7 days prior to CABG due to the risk of major bleeding complications. He is anxious to proceed and does not want to wait that long. I  will order a P2Y12 assay to get an indication as to the plavix effect and whether it would be safe to proceed sooner.  Jaxie Racanelli C 08/14/2014, 4:02 PM      

## 2014-08-17 NOTE — Anesthesia Procedure Notes (Signed)
Procedure Name: Intubation Date/Time: 08/17/2014 12:58 PM Performed by: Octavio Graves Pre-anesthesia Checklist: Patient identified, Timeout performed, Emergency Drugs available, Suction available and Patient being monitored Patient Re-evaluated:Patient Re-evaluated prior to inductionOxygen Delivery Method: Circle system utilized Preoxygenation: Pre-oxygenation with 100% oxygen Intubation Type: IV induction Ventilation: Mask ventilation without difficulty Laryngoscope Size: Miller and 2 Grade View: Grade I Tube type: Oral Number of attempts: 1 Airway Equipment and Method: Stylet Placement Confirmation: ETT inserted through vocal cords under direct vision,  breath sounds checked- equal and bilateral and positive ETCO2 Secured at: 22 cm Tube secured with: Tape Dental Injury: Teeth and Oropharynx as per pre-operative assessment  Comments: IV induction Fitzgerald- intubation AM CRNA- atraumatic- teeth and mouth as preop- bilat BS Ola Spurr

## 2014-08-17 NOTE — Procedures (Signed)
Extubation Procedure Note  Patient Details:   Name: Michael Herrera DOB: October 05, 1950 MRN: [id number]   Airway Documentation:   Patient extubated to 4 lpm nasal cannula.  VC 1000 ml, NIF -25, patient able to breathe around deflated cuff and vocalize post procedure.    Evaluation  O2 sats: stable throughout Complications: No apparent complications Patient did tolerate procedure well. Bilateral Breath Sounds: Clear   Yes  Macaria Bias, Elwyn Lade 08/17/2014, 11:01 PM

## 2014-08-17 NOTE — Progress Notes (Signed)
TCTS BRIEF SICU PROGRESS NOTE  Day of Surgery  S/P Procedure(s) (LRB): CORONARY ARTERY BYPASS GRAFTING (CABG) (N/A)   Starting to wake on vent NSR w/ stable hemodynamics on low dose Neo drip Chest tube output trending down UOP adequate Labs okay w/ Hgb 10.0  Plan: Continue routine early postop  Michael Herrera H 08/17/2014 8:47 PM

## 2014-08-18 ENCOUNTER — Encounter (HOSPITAL_COMMUNITY): Payer: Self-pay | Admitting: Thoracic Surgery (Cardiothoracic Vascular Surgery)

## 2014-08-18 ENCOUNTER — Inpatient Hospital Stay (HOSPITAL_COMMUNITY): Payer: 59

## 2014-08-18 LAB — BASIC METABOLIC PANEL
Anion gap: 6 (ref 5–15)
BUN: 12 mg/dL (ref 6–23)
CO2: 23 mmol/L (ref 19–32)
CREATININE: 0.95 mg/dL (ref 0.50–1.35)
Calcium: 7.7 mg/dL — ABNORMAL LOW (ref 8.4–10.5)
Chloride: 107 mEq/L (ref 96–112)
GFR calc Af Amer: >90 mL/min (ref >90)
GFR calc non Af Amer: 86 mL/min — ABNORMAL LOW (ref >90)
Glucose, Bld: 149 mg/dL — ABNORMAL HIGH (ref 70–99)
Potassium: 4.2 mmol/L (ref 3.5–5.1)
Sodium: 136 mmol/L (ref 135–145)

## 2014-08-18 LAB — MAGNESIUM
Magnesium: 2.3 mg/dL (ref 1.5–2.5)
Magnesium: 2.2 mg/dL (ref 1.5–2.5)
Magnesium: 2 mg/dL (ref 1.5–2.5)

## 2014-08-18 LAB — POCT I-STAT, CHEM 8
BUN: 12 mg/dL (ref 6–23)
CREATININE: 1 mg/dL (ref 0.50–1.35)
Calcium, Ion: 1.2 mmol/L (ref 1.13–1.30)
Chloride: 99 mEq/L (ref 96–112)
GLUCOSE: 205 mg/dL — AB (ref 70–99)
HCT: 26 % — ABNORMAL LOW (ref 39.0–52.0)
HEMOGLOBIN: 8.8 g/dL — AB (ref 13.0–17.0)
Potassium: 4 mmol/L (ref 3.5–5.1)
Sodium: 136 mmol/L (ref 135–145)
TCO2: 17 mmol/L (ref 0–100)

## 2014-08-18 LAB — GLUCOSE, CAPILLARY
GLUCOSE-CAPILLARY: 100 mg/dL — AB (ref 70–99)
GLUCOSE-CAPILLARY: 135 mg/dL — AB (ref 70–99)
Glucose-Capillary: 113 mg/dL — ABNORMAL HIGH (ref 70–99)
Glucose-Capillary: 159 mg/dL — ABNORMAL HIGH (ref 70–99)
Glucose-Capillary: 188 mg/dL — ABNORMAL HIGH (ref 70–99)
Glucose-Capillary: 176 mg/dL — ABNORMAL HIGH (ref 70–99)

## 2014-08-18 LAB — CREATININE, SERUM
Creatinine, Ser: 1.05 mg/dL (ref 0.50–1.35)
Creatinine, Ser: 1.25 mg/dL (ref 0.50–1.35)
GFR calc Af Amer: 85 mL/min — ABNORMAL LOW (ref >90)
GFR calc Af Amer: 69 mL/min — ABNORMAL LOW (ref >90)
GFR calc non Af Amer: 73 mL/min — ABNORMAL LOW (ref >90)
GFR calc non Af Amer: 59 mL/min — ABNORMAL LOW (ref >90)

## 2014-08-18 LAB — CBC
HCT: 21.7 % — ABNORMAL LOW (ref 39.0–52.0)
HCT: 22.5 % — ABNORMAL LOW (ref 39.0–52.0)
HCT: 23.9 % — ABNORMAL LOW (ref 39.0–52.0)
HEMOGLOBIN: 7.8 g/dL — AB (ref 13.0–17.0)
Hemoglobin: 7.7 g/dL — ABNORMAL LOW (ref 13.0–17.0)
Hemoglobin: 8.5 g/dL — ABNORMAL LOW (ref 13.0–17.0)
MCH: 31 pg (ref 26.0–34.0)
MCH: 30.4 pg (ref 26.0–34.0)
MCH: 31.1 pg (ref 26.0–34.0)
MCHC: 35.5 g/dL (ref 30.0–36.0)
MCHC: 34.7 g/dL (ref 30.0–36.0)
MCHC: 35.6 g/dL (ref 30.0–36.0)
MCV: 87.5 fL (ref 78.0–100.0)
MCV: 87.5 fL (ref 78.0–100.0)
MCV: 87.5 fL (ref 78.0–100.0)
Platelets: 144 10*3/uL — ABNORMAL LOW (ref 150–400)
Platelets: 173 10*3/uL (ref 150–400)
Platelets: 176 10*3/uL (ref 150–400)
RBC: 2.48 MIL/uL — ABNORMAL LOW (ref 4.22–5.81)
RBC: 2.57 MIL/uL — ABNORMAL LOW (ref 4.22–5.81)
RBC: 2.73 MIL/uL — ABNORMAL LOW (ref 4.22–5.81)
RDW: 12.3 % (ref 11.5–15.5)
RDW: 12.5 % (ref 11.5–15.5)
RDW: 12.7 % (ref 11.5–15.5)
WBC: 13.1 10*3/uL — ABNORMAL HIGH (ref 4.0–10.5)
WBC: 11.6 10*3/uL — ABNORMAL HIGH (ref 4.0–10.5)
WBC: 13.7 10*3/uL — ABNORMAL HIGH (ref 4.0–10.5)

## 2014-08-18 LAB — POCT I-STAT 3, ART BLOOD GAS (G3+)
ACID-BASE DEFICIT: 5 mmol/L — AB (ref 0.0–2.0)
Acid-base deficit: 7 mmol/L — ABNORMAL HIGH (ref 0.0–2.0)
BICARBONATE: 21.2 meq/L (ref 20.0–24.0)
Bicarbonate: 18.7 mEq/L — ABNORMAL LOW (ref 20.0–24.0)
O2 Saturation: 92 %
O2 Saturation: 99 %
PO2 ART: 157 mmHg — AB (ref 80.0–100.0)
Patient temperature: 36.1
TCO2: 20 mmol/L (ref 0–100)
TCO2: 22 mmol/L (ref 0–100)
pCO2 arterial: 35.2 mmHg (ref 35.0–45.0)
pCO2 arterial: 40.1 mmHg (ref 35.0–45.0)
pH, Arterial: 7.326 — ABNORMAL LOW (ref 7.350–7.450)
pH, Arterial: 7.33 — ABNORMAL LOW (ref 7.350–7.450)
pO2, Arterial: 66 mmHg — ABNORMAL LOW (ref 80.0–100.0)

## 2014-08-18 MED ORDER — SODIUM CHLORIDE 0.9 % IV SOLN
1.0000 g | Freq: Once | INTRAVENOUS | Status: AC
  Administered 2014-08-18: 1 g via INTRAVENOUS
  Filled 2014-08-18: qty 10

## 2014-08-18 MED ORDER — FUROSEMIDE 10 MG/ML IJ SOLN: 20.0000 mg | Freq: Once | INTRAMUSCULAR | Status: DC

## 2014-08-18 MED ORDER — ENOXAPARIN SODIUM 40 MG/0.4ML ~~LOC~~ SOLN
40.0000 mg | Freq: Every day | SUBCUTANEOUS | Status: DC
  Administered 2014-08-18 – 2014-08-21 (×4): 40 mg via SUBCUTANEOUS
  Filled 2014-08-18 (×5): qty 0.4

## 2014-08-18 MED ORDER — DICLOFENAC SODIUM 75 MG PO TBEC
75.0000 mg | DELAYED_RELEASE_TABLET | Freq: Every day | ORAL | Status: DC | PRN
  Filled 2014-08-18: qty 1

## 2014-08-18 MED ORDER — ALBUMIN HUMAN 5 % IV SOLN
12.5000 g | Freq: Once | INTRAVENOUS | Status: AC
  Administered 2014-08-18: 12.5 g via INTRAVENOUS
  Filled 2014-08-18: qty 250

## 2014-08-18 MED ORDER — CLOPIDOGREL BISULFATE 75 MG PO TABS
75.0000 mg | ORAL_TABLET | Freq: Every day | ORAL | Status: DC
  Administered 2014-08-18 – 2014-08-22 (×5): 75 mg via ORAL
  Filled 2014-08-18 (×7): qty 1

## 2014-08-18 MED ORDER — INSULIN DETEMIR 100 UNIT/ML ~~LOC~~ SOLN
15.0000 [IU] | Freq: Once | SUBCUTANEOUS | Status: AC
  Administered 2014-08-18: 15 [IU] via SUBCUTANEOUS
  Filled 2014-08-18: qty 0.15

## 2014-08-18 MED ORDER — OMEGA-3-ACID ETHYL ESTERS 1 G PO CAPS
1.0000 g | ORAL_CAPSULE | Freq: Every day | ORAL | Status: DC
  Administered 2014-08-18 – 2014-08-22 (×5): 1 g via ORAL
  Filled 2014-08-18 (×5): qty 1

## 2014-08-18 MED ORDER — INSULIN ASPART 100 UNIT/ML ~~LOC~~ SOLN
0.0000 [IU] | SUBCUTANEOUS | Status: DC
  Administered 2014-08-18 (×2): 4 [IU] via SUBCUTANEOUS
  Administered 2014-08-18 – 2014-08-19 (×4): 2 [IU] via SUBCUTANEOUS

## 2014-08-18 MED ORDER — FUROSEMIDE 10 MG/ML IJ SOLN
20.0000 mg | Freq: Once | INTRAMUSCULAR | Status: AC
  Administered 2014-08-18: 20 mg via INTRAVENOUS
  Filled 2014-08-18: qty 2

## 2014-08-18 MED ORDER — CETYLPYRIDINIUM CHLORIDE 0.05 % MT LIQD
7.0000 mL | Freq: Two times a day (BID) | OROMUCOSAL | Status: DC
  Administered 2014-08-18 – 2014-08-19 (×3): 7 mL via OROMUCOSAL

## 2014-08-18 MED FILL — Magnesium Sulfate Inj 50%: INTRAMUSCULAR | Qty: 10 | Status: AC

## 2014-08-18 MED FILL — Potassium Chloride Inj 2 mEq/ML: INTRAVENOUS | Qty: 40 | Status: AC

## 2014-08-18 MED FILL — Heparin Sodium (Porcine) Inj 1000 Unit/ML: INTRAMUSCULAR | Qty: 30 | Status: AC

## 2014-08-18 NOTE — Progress Notes (Signed)
1 Day Post-Op Procedure(s) (LRB): CORONARY ARTERY BYPASS GRAFTING (CABG) (N/A) Subjective: Some incisional pain  Objective: Vital signs in last 24 hours: Temp:  [96.1 F (35.6 C)-99 F (37.2 C)] 99 F (37.2 C) (01/12 0745) Pulse Rate:  [59-93] 87 (01/12 0745) Cardiac Rhythm:  [-] Normal sinus rhythm (01/12 0400) Resp:  [0-47] 26 (01/12 0745) BP: (77-128)/(52-72) 92/53 mmHg (01/12 0700) SpO2:  [87 %-100 %] 98 % (01/12 0745) Arterial Line BP: (79-132)/(45-74) 114/49 mmHg (01/12 0745) FiO2 (%):  [40 %-50 %] 40 % (01/11 2225) Weight:  [157 lb 10.1 oz (71.5 kg)] 157 lb 10.1 oz (71.5 kg) (01/12 0500)  Hemodynamic parameters for last 24 hours: PAP: (22-41)/(12-27) 32/14 mmHg CO:  [3.3 L/min-6.1 L/min] 4.4 L/min CI:  [1.9 L/min/m2-3.4 L/min/m2] 2.5 L/min/m2  Intake/Output from previous day: 01/11 0701 - 01/12 0700 In: 5601.3 [I.V.:3555.3; Blood:186; NG/GT:60; IV Piggyback:1800] Out: 3270 [Urine:1990; Blood:650; Chest Tube:630] Intake/Output this shift:    General appearance: alert and no distress Neurologic: intact Heart: regular rate and rhythm Lungs: diminished breath sounds bibasilar Abdomen: normal findings: soft, non-tender  Lab Results:  Recent Labs  08/17/14 1700 08/17/14 1737 08/18/14 0256  WBC 9.7  --  13.1*  HGB 10.0* 8.5* 7.7*  HCT 28.8* 25.0* 21.7*  PLT 118*  --  144*   BMET:  Recent Labs  08/16/14 2107  08/17/14 1624 08/17/14 1737 08/18/14 0256  NA 140  < > 139 141 136  K 3.9  < > 4.0 3.4* 4.2  CL 104  < > 103  --  107  CO2 28  --   --   --  23  GLUCOSE 99  < > 113* 105* 149*  BUN 16  < > 12  --  12  CREATININE 1.04  < > 0.60  --  0.95  CALCIUM 9.5  --   --   --  7.7*  < > = values in this interval not displayed.  PT/INR:  Recent Labs  08/17/14 1700  LABPROT 18.2*  INR 1.49   ABG    Component Value Date/Time   PHART 7.330* 08/18/2014 0022   HCO3 18.7* 08/18/2014 0022   TCO2 20 08/18/2014 0022   ACIDBASEDEF 7.0* 08/18/2014 0022   O2SAT 92.0 08/18/2014 0022   CBG (last 3)   Recent Labs  08/17/14 1950 08/17/14 2357 08/18/14 0333  GLUCAP 100* 122* 135*    Assessment/Plan: S/P Procedure(s) (LRB): CORONARY ARTERY BYPASS GRAFTING (CABG) (N/A) POD # 1  CV- good index  Relative hypotension- still on neo drip- albumin + Calcium this AM  Wean neo as tolerated  Resume plavix for stents  RESP- bibasilar atelectasis- IS  RENAL- lytes and creatinine OK  Diurese  ENDO- CBG well controlled, transition to Q4 SSI  Anemia secondary to ABL- may need transfusion but will hold off for now  DC CT  OOB to chair and begin ambulation today  SCD + enoxaparin for DVT prophylaxis   LOS: 4 days    Janaisha Tolsma C 08/18/2014

## 2014-08-18 NOTE — Op Note (Signed)
NAMEQUINT, CHESTNUT                ACCOUNT NO.:  [id number]  MEDICAL RECORD NO.:  [id number]  LOCATION:  2S01C                        FACILITY:  [facility]  PHYSICIAN:  Revonda Standard. [name], M.D.DATE OF BIRTH:  12-10-1950  DATE OF PROCEDURE:  08/17/2014 DATE OF DISCHARGE:                              OPERATIVE REPORT   PREOPERATIVE DIAGNOSIS:  Left main and 2-vessel coronary disease with exertional angina.  POSTOPERATIVE DIAGNOSIS:  Left main and 2-vessel coronary disease with exertional angina.  PROCEDURES: 1. Median sternotomy. 2. Extracorporeal circulation. 3. Coronary artery bypass grafting x3   Left internal mammary artery to left anterior descending  Saphenous vein graft to second diagonal,  Saphenous vein graft to obtuse marginal 1 4. Endoscopic vein harvest, right thigh.  SURGEON:  Revonda Standard. [name], M.D.  ASSISTANT:  Lars Pinks, PA-C.  ANESTHESIA:  General.  FINDINGS:  LAD and OM-1- good quality targets; diagonal- fair quality target; OM2- too small to graft.  Good quality conduits. Normal heart size.  CLINICAL NOTE:  Mr. Hoffer is a 64 year old gentleman with a long history of coronary disease.  He presented with classic exertional angina.  At catheterization, he had left main disease and a severe stenosis at the ostium of his circumflex.  His previous stents were patent.  He did have disease in OM-1 as well as a second diagonal branch of the LAD.  There were no percutaneous options for revascularization. The patient was offered coronary bypass grafting.  The indications, risks, benefits, and alternatives were discussed in detail with the patient.  He understood and accepted the risks and agreed to proceed. Surgery was initially scheduled for Thursday to allow complete washout of Plavix prior to his operation.  However, the patient was very anxious, and at one point, threatened to leave against medical advice.  He was willing to accept the increased  risk of bleeding complications to proceed with surgery on 08/17/2014.  He understood the issues at hand clearly, and his procedure was rescheduled.  OPERATIVE NOTE:  Mr. Allum was brought to the preoperative holding area on 08/17/2014.  There, Anesthesia placed a Swan-Ganz catheter and an arterial blood pressure monitoring line.  He was taken to the operating room, anesthetized, and intubated.  Intravenous antibiotics were administered.  A Foley catheter was placed.  The chest, abdomen, and legs were prepped and draped in usual sterile fashion.  An incision was made in the medial aspect of the right leg at the level of the knee.  The greater saphenous vein was harvested from the right thigh endoscopically.  It was a good quality vessel.  Simultaneously, a median sternotomy was performed, and the left internal mammary artery was harvested using standard technique, and the patient was heparinized prior to dividing the distal end of the mammary artery.  Both the mammary artery and saphenous vein were good quality.  After harvesting the conduits, the ACT was measured to ensure adequate anticoagulation.  The pericardium was opened.  The ascending aorta was cannulated via concentric 2-0 Ethibond pledgeted pursestring sutures.  A dual-stage venous cannula was placed via a pursestring suture in the right atrial appendage.  Cardiopulmonary bypass was instituted, and flows were maintained per protocol.  The patient was cooled to 32 degrees Celsius.  The coronary arteries were inspected and anastomotic sites were chosen.  Of note, obtuse marginal 2 was too small to graft where it could be visualized.  The conduits were inspected and cut to length.  A foam pad was placed in the pericardium to insulate the heart and protect the left phrenic nerve.  A temperature probe was placed in myocardial septum and a cardioplegia cannula was placed in the ascending aorta.  The aorta was crossclamped.  The  left ventricle was emptied via the aortic root vent.  Cardiac arrest then was achieved with a combination of cold antegrade blood cardioplegia and topical iced saline.  1 L of cardioplegia was administered.  There was a rapid diastolic arrest. There was septal cooling to 10 degrees Celsius.  A reversed saphenous vein graft was placed end-to-side to the 1st obtuse marginal branch of the left circumflex.  This was a 1.5 mm good quality target vessel.  The vein was of good quality.  The end-to-side anastomosis was performed with running 7-0 Prolene suture.  At completion of anastomosis, a probe was passed proximally and distally. Cardioplegia then was administered, and there was good flow and good hemostasis.  Next, a reversed saphenous vein graft was placed end-to-side to the second diagonal branch of the LAD.  This vessel had a tight 90% stenosis.  It was grafted just before it bifurcated into 2 smaller branches.  The vein was again of good quality, was anastomosed end-to- side with running 7-0 Prolene suture.  A 1 mm probe passed easily into both of the branches of the diagonal after the anastomosis. Cardioplegia was administered.  There was good flow and good hemostasis. Additional cardioplegia was administered down the vein grafts as well as via the aortic root.  The left internal mammary artery was brought through a window in the pericardium.  The distal end was beveled.  It was then anastomosed end- to-side to the distal LAD.  The LAD was a 1.5 mm good quality target. The mammary was 1.5 mm good quality conduit.  An end-to-side anastomosis was performed with a running 8-0 Prolene suture.  At completion of anastomosis, the bulldog clamps were briefly removed.  Rapid septal rewarming was noted.  There was good hemostasis.  The bulldog clamp was replaced.  The mammary pedicle was tacked to the epicardial surface of the heart with 6-0 Prolene sutures.  Additional cardioplegia was  administered.  The vein grafts were cut to length.  The cardioplegia cannula was removed from the ascending aorta, and the proximal vein graft anastomoses were performed to 4.5 mm punch aortotomies with running 6-0 Prolene sutures.  At the completion of final proximal anastomosis, the patient was placed in Trendelenburg position.  Lidocaine was administered.  The aortic root was de-aired and aortic crossclamp was removed.  Total crossclamp time was 55 minutes. The patient did not require defibrillation and spontaneously resumed sinus rhythm.  While rewarming was completed, all proximal and distal anastomoses were inspected for hemostasis.  Epicardial pacing wires were placed on the right ventricle and right atrium. When the patient had rewarmed to a core temperature of 37 degrees Celsius, he was weaned from cardiopulmonary bypass without difficulty.  He was in sinus rhythm and on no inotropic support.  The total bypass time was 86 minutes.  Initial cardiac index was 2 liters/minute/meter squared.  This subsequently improved with volume resuscitation.  He did not require inotropic support.  A test dose of protamine  was administered, and was well tolerated.  The atrial and aortic cannulae were removed.  The remainder of the protamine was administered without incident.  The chest irrigated with warm saline.  Hemostasis was achieved.  The pericardium was reapproximated with interrupted 3-0 silk sutures, it came together easily without tension or kinking of the underlying grafts.  Left pleural and mediastinal chest tubes were placed via separate subcostal incisions.  The sternum was closed with a combination of single and double heavy gauge stainless steel wires.  The pectoralis fascia, subcutaneous tissue, and skin were closed in standard fashion.  All sponge, needle, and instrument counts were correct.  At the end of the procedure, the patient was taken from the operating room to the  surgical intensive care unit in good condition.     Revonda Standard [name], M.D.     SCH/MEDQ  D:  08/17/2014  T:  08/18/2014  Job:  [id number]

## 2014-08-18 NOTE — Progress Notes (Signed)
Patient ID: Michael Herrera, male   DOB: 1950/12/29, 64 y.o.   MRN: [id number] EVENING ROUNDS NOTE :     [address]       [place] [address]             [phone]                 1 Day Post-Op Procedure(s) (LRB): CORONARY ARTERY BYPASS GRAFTING (CABG) (N/A)  Total Length of Stay:  LOS: 4 days  BP 107/62 mmHg  Pulse 94  Temp(Src) 97.6 F (36.4 C) (Oral)  Resp 15  Ht 5\' 9"  (1.753 m)  Wt 157 lb 10.1 oz (71.5 kg)  BMI 23.27 kg/m2  SpO2 98%  .Intake/Output      01/11 0701 - 01/12 0700 01/12 0701 - 01/13 0700   P.O. 0 460   I.V. (mL/kg) 3555.3 (49.7) 521.6 (7.3)   Blood 186    NG/GT 60    IV Piggyback 1800 410   Total Intake(mL/kg) 5601.3 (78.3) 1391.6 (19.5)   Urine (mL/kg/hr) 1990 (1.2) 1480 (1.9)   Blood 650 (0.4)    Chest Tube 630 (0.4) 50 (0.1)   Total Output 3270 1530   Net +2331.3 -138.4          . sodium chloride Stopped (08/18/14 1030)  . sodium chloride    . sodium chloride 20 mL/hr at 08/17/14 1800  . dexmedetomidine Stopped (08/17/14 2200)  . lactated ringers 20 mL/hr at 08/18/14 0700  . nitroGLYCERIN    . phenylephrine (NEO-SYNEPHRINE) Adult infusion 20 mcg/min (08/18/14 1700)     Lab Results  Component Value Date   WBC 13.7* 08/18/2014   HGB 8.8* 08/18/2014   HCT 26.0* 08/18/2014   PLT 176 08/18/2014   GLUCOSE 205* 08/18/2014   CHOL 147 05/15/2014   TRIG 112.0 05/15/2014   HDL 35.80* 05/15/2014   LDLCALC 89 05/15/2014   ALT 52 08/16/2014   AST 37 08/16/2014   NA 136 08/18/2014   K 4.0 08/18/2014   CL 99 08/18/2014   CREATININE 1.00 08/18/2014   BUN 12 08/18/2014   CO2 23 08/18/2014   INR 1.49 08/17/2014   HGBA1C 5.6 08/16/2014   Stable day Still on neo 22micr   Grace Isaac MD  Beeper [phone] Office [phone] 08/18/2014 5:45 PM

## 2014-08-18 NOTE — Care Management Note (Signed)
    Page 1 of 1   08/18/2014     9:22:19 AM CARE MANAGEMENT NOTE 08/18/2014  Patient:  Michael Herrera, Michael Herrera   Account Number:  [id number]  Date Initiated:  08/18/2014  Documentation initiated by:  Maridee Slape  Subjective/Objective Assessment:   s/p CABG x 3; lives with spouse    PCP  [facility]  CM consult      Status of service:  In process, will continue to follow  Per UR Regulation:  Reviewed for med. necessity/level of care/duration of stay

## 2014-08-19 ENCOUNTER — Inpatient Hospital Stay (HOSPITAL_COMMUNITY): Payer: 59

## 2014-08-19 DIAGNOSIS — R000 Tachycardia, unspecified: Secondary | ICD-10-CM

## 2014-08-19 LAB — GLUCOSE, CAPILLARY
GLUCOSE-CAPILLARY: 125 mg/dL — AB (ref 70–99)
GLUCOSE-CAPILLARY: 139 mg/dL — AB (ref 70–99)
GLUCOSE-CAPILLARY: 148 mg/dL — AB (ref 70–99)
Glucose-Capillary: 121 mg/dL — ABNORMAL HIGH (ref 70–99)
Glucose-Capillary: 159 mg/dL — ABNORMAL HIGH (ref 70–99)

## 2014-08-19 LAB — BASIC METABOLIC PANEL
Anion gap: 5 (ref 5–15)
BUN: 12 mg/dL (ref 6–23)
CALCIUM: 8.2 mg/dL — AB (ref 8.4–10.5)
CHLORIDE: 99 meq/L (ref 96–112)
CO2: 26 mmol/L (ref 19–32)
CREATININE: 1.13 mg/dL (ref 0.50–1.35)
GFR, EST AFRICAN AMERICAN: 78 mL/min — AB (ref >90)
GFR, EST NON AFRICAN AMERICAN: 67 mL/min — AB (ref >90)
GLUCOSE: 137 mg/dL — AB (ref 70–99)
Potassium: 3.4 mmol/L — ABNORMAL LOW (ref 3.5–5.1)
SODIUM: 130 mmol/L — AB (ref 135–145)

## 2014-08-19 LAB — CBC
HCT: 19.9 % — ABNORMAL LOW (ref 39.0–52.0)
Hemoglobin: 7 g/dL — ABNORMAL LOW (ref 13.0–17.0)
MCH: 31.3 pg (ref 26.0–34.0)
MCHC: 35.2 g/dL (ref 30.0–36.0)
MCV: 88.8 fL (ref 78.0–100.0)
PLATELETS: 105 10*3/uL — AB (ref 150–400)
RBC: 2.24 MIL/uL — ABNORMAL LOW (ref 4.22–5.81)
RDW: 12.8 % (ref 11.5–15.5)
WBC: 9.9 10*3/uL (ref 4.0–10.5)

## 2014-08-19 LAB — PREPARE RBC (CROSSMATCH)

## 2014-08-19 MED ORDER — INSULIN DETEMIR 100 UNIT/ML ~~LOC~~ SOLN
15.0000 [IU] | Freq: Every day | SUBCUTANEOUS | Status: DC
  Administered 2014-08-19 – 2014-08-20 (×2): 15 [IU] via SUBCUTANEOUS
  Filled 2014-08-19 (×2): qty 0.15

## 2014-08-19 MED ORDER — SODIUM CHLORIDE 0.9 % IV SOLN
Freq: Once | INTRAVENOUS | Status: AC
  Administered 2014-08-19: 09:00:00 via INTRAVENOUS

## 2014-08-19 MED ORDER — METOCLOPRAMIDE HCL 5 MG/ML IJ SOLN
10.0000 mg | Freq: Four times a day (QID) | INTRAMUSCULAR | Status: AC
  Administered 2014-08-19 – 2014-08-20 (×3): 10 mg via INTRAVENOUS
  Filled 2014-08-19 (×4): qty 2

## 2014-08-19 MED ORDER — INSULIN ASPART 100 UNIT/ML ~~LOC~~ SOLN
0.0000 [IU] | Freq: Three times a day (TID) | SUBCUTANEOUS | Status: DC
  Administered 2014-08-19: 3 [IU] via SUBCUTANEOUS
  Administered 2014-08-19 – 2014-08-21 (×3): 2 [IU] via SUBCUTANEOUS

## 2014-08-19 MED ORDER — ALPRAZOLAM 0.25 MG PO TABS: 0.2500 mg | ORAL_TABLET | Freq: Four times a day (QID) | ORAL | Status: DC | PRN

## 2014-08-19 MED ORDER — POTASSIUM CHLORIDE 10 MEQ/50ML IV SOLN
10.0000 meq | INTRAVENOUS | Status: AC
  Administered 2014-08-19 (×2): 10 meq via INTRAVENOUS
  Filled 2014-08-19 (×2): qty 50

## 2014-08-19 MED ORDER — POTASSIUM CHLORIDE 10 MEQ/50ML IV SOLN
10.0000 meq | INTRAVENOUS | Status: AC
  Administered 2014-08-19 (×3): 10 meq via INTRAVENOUS
  Filled 2014-08-19 (×3): qty 50

## 2014-08-19 MED ORDER — ZOLPIDEM TARTRATE 5 MG PO TABS: 5.0000 mg | ORAL_TABLET | Freq: Every evening | ORAL | Status: DC | PRN

## 2014-08-19 MED ORDER — SODIUM CHLORIDE 0.9 % IJ SOLN
3.0000 mL | Freq: Two times a day (BID) | INTRAMUSCULAR | Status: DC
  Administered 2014-08-19: 3 mL via INTRAVENOUS
  Administered 2014-08-19: 10 mL via INTRAVENOUS
  Administered 2014-08-20 – 2014-08-22 (×5): 3 mL via INTRAVENOUS

## 2014-08-19 MED ORDER — GUAIFENESIN-DM 100-10 MG/5ML PO SYRP: 15.0000 mL | ORAL_SOLUTION | ORAL | Status: DC | PRN

## 2014-08-19 MED ORDER — FUROSEMIDE 10 MG/ML IJ SOLN
20.0000 mg | Freq: Once | INTRAMUSCULAR | Status: AC
  Administered 2014-08-19: 20 mg via INTRAVENOUS
  Filled 2014-08-19: qty 2

## 2014-08-19 MED ORDER — MAGNESIUM HYDROXIDE 400 MG/5ML PO SUSP: 30.0000 mL | Freq: Every day | ORAL | Status: DC | PRN

## 2014-08-19 MED ORDER — SODIUM CHLORIDE 0.9 % IV SOLN: 250.0000 mL | INTRAVENOUS | Status: DC | PRN

## 2014-08-19 MED ORDER — ALUM & MAG HYDROXIDE-SIMETH 200-200-20 MG/5ML PO SUSP: 15.0000 mL | ORAL | Status: DC | PRN

## 2014-08-19 MED ORDER — MOVING RIGHT ALONG BOOK
Freq: Once | Status: AC
  Administered 2014-08-20: 06:00:00
  Filled 2014-08-19 (×2): qty 1

## 2014-08-19 MED ORDER — SODIUM CHLORIDE 0.9 % IJ SOLN: 3.0000 mL | INTRAMUSCULAR | Status: DC | PRN

## 2014-08-19 MED ORDER — PROMETHAZINE HCL 25 MG/ML IJ SOLN
12.5000 mg | INTRAMUSCULAR | Status: DC | PRN
  Administered 2014-08-19: 12.5 mg via INTRAVENOUS
  Filled 2014-08-19: qty 1

## 2014-08-19 MED FILL — Sodium Chloride IV Soln 0.9%: INTRAVENOUS | Qty: 2000 | Status: AC

## 2014-08-19 MED FILL — Mannitol IV Soln 20%: INTRAVENOUS | Qty: 500 | Status: AC

## 2014-08-19 MED FILL — Heparin Sodium (Porcine) Inj 1000 Unit/ML: INTRAMUSCULAR | Qty: 10 | Status: AC

## 2014-08-19 MED FILL — Electrolyte-R (PH 7.4) Solution: INTRAVENOUS | Qty: 4000 | Status: AC

## 2014-08-19 MED FILL — Lidocaine HCl IV Inj 20 MG/ML: INTRAVENOUS | Qty: 5 | Status: AC

## 2014-08-19 MED FILL — Sodium Bicarbonate IV Soln 8.4%: INTRAVENOUS | Qty: 50 | Status: AC

## 2014-08-19 NOTE — Progress Notes (Signed)
2 Days Post-Op Procedure(s) (LRB): CORONARY ARTERY BYPASS GRAFTING (CABG) (N/A) Subjective: Dizzy when he got up this AM Mild nausea, generally feels weak  Objective: Vital signs in last 24 hours: Temp:  [97.6 F (36.4 C)-99 F (37.2 C)] 97.6 F (36.4 C) (01/13 0734) Pulse Rate:  [75-152] 113 (01/13 0800) Cardiac Rhythm:  [-] Sinus tachycardia (01/13 0800) Resp:  [15-48] 15 (01/13 0800) BP: (86-113)/(53-80) 108/65 mmHg (01/13 0800) SpO2:  [86 %-100 %] 91 % (01/13 0800) Arterial Line BP: (74-148)/(38-59) 148/58 mmHg (01/13 0800) Weight:  [157 lb 13.6 oz (71.6 kg)] 157 lb 13.6 oz (71.6 kg) (01/13 0500)  Hemodynamic parameters for last 24 hours: PAP: (30-35)/(13-19) 35/19 mmHg  Intake/Output from previous day: 01/12 0701 - 01/13 0700 In: 1863.5 [P.O.:460; I.V.:893.5; IV Piggyback:510] Out: 2095 [Urine:2045; Chest Tube:50] Intake/Output this shift: Total I/O In: 70 [I.V.:20; IV Piggyback:50] Out: 25 [Urine:25]  General appearance: alert and no distress Neurologic: intact Heart: tachy, regular Lungs: diminished breath sounds bibasilar Abdomen: soft, nontender, hypoactive BS  Lab Results:  Recent Labs  08/18/14 1603 08/18/14 1610 08/19/14 0450  WBC 13.7*  --  9.9  HGB 8.5* 8.8* 7.0*  HCT 23.9* 26.0* 19.9*  PLT 176  --  105*   BMET:  Recent Labs  08/18/14 0256  08/18/14 1610 08/19/14 0450  NA 136  --  136 130*  K 4.2  --  4.0 3.4*  CL 107  --  99 99  CO2 23  --   --  26  GLUCOSE 149*  --  205* 137*  BUN 12  --  12 12  CREATININE 0.95  < > 1.00 1.13  CALCIUM 7.7*  --   --  8.2*  < > = values in this interval not displayed.  PT/INR:  Recent Labs  08/17/14 1700  LABPROT 18.2*  INR 1.49   ABG    Component Value Date/Time   PHART 7.330* 08/18/2014 0022   HCO3 18.7* 08/18/2014 0022   TCO2 17 08/18/2014 1610   ACIDBASEDEF 7.0* 08/18/2014 0022   O2SAT 92.0 08/18/2014 0022   CBG (last 3)   Recent Labs  08/18/14 1910 08/18/14 2343 08/19/14 0402   GLUCAP 176* 139* 125*    Assessment/Plan: S/P Procedure(s) (LRB): CORONARY ARTERY BYPASS GRAFTING (CABG) (N/A) Plan for transfer to step-down: see transfer orders   CV- off neo, tachy this AM  RESP- LLL atelectasis on CXR- IS  RENAL- creatinine OK, hypokalemia- supplement  Continue diuresis  ENDO- CBG improved, continue levemir, AC and HS SSI  Anemia secondary to ABL- hgb down to 7- symptomatic with malaise, nausea, lightheadedness  Will transfuse 1 unit  Transfer to 2000 when bed available   LOS: 5 days    Saiquan Hands C 08/19/2014

## 2014-08-19 NOTE — Progress Notes (Addendum)
   Objective:  Vital Signs in the last 24 hours: Temp:  [97.6 F (36.4 C)-99 F (37.2 C)] 98 F (36.7 C) (01/13 1138) Pulse Rate:  [75-152] 126 (01/13 1100) Resp:  [15-33] 22 (01/13 1100) BP: (86-117)/(53-80) 106/58 mmHg (01/13 1100) SpO2:  [86 %-100 %] 96 % (01/13 1100) Arterial Line BP: (74-148)/(38-61) 147/61 mmHg (01/13 1000) Weight:  [157 lb 13.6 oz (71.6 kg)] 157 lb 13.6 oz (71.6 kg) (01/13 0500)  Intake/Output from previous day: 01/12 0701 - 01/13 0700 In: 1863.5 [P.O.:460; I.V.:893.5; IV Piggyback:510] Out: 2095 [Urine:2045; Chest Tube:50]    Scheduled Meds: . acetaminophen  1,000 mg Oral 4 times per day   Or  . acetaminophen (TYLENOL) oral liquid 160 mg/5 mL  1,000 mg Per Tube 4 times per day  . antiseptic oral rinse  7 mL Mouth Rinse BID  . aspirin EC  325 mg Oral Daily   Or  . aspirin  324 mg Per Tube Daily  . atorvastatin  80 mg Oral q1800  . bisacodyl  10 mg Oral Daily   Or  . bisacodyl  10 mg Rectal Daily  . cefUROXime (ZINACEF)  IV  1.5 g Intravenous Q12H  . clopidogrel  75 mg Oral Q breakfast  . docusate sodium  200 mg Oral Daily  . enoxaparin (LOVENOX) injection  40 mg Subcutaneous QHS  . ezetimibe  10 mg Oral Daily  . insulin aspart  0-15 Units Subcutaneous TID WC  . insulin detemir  15 Units Subcutaneous Daily  . metoprolol tartrate  12.5 mg Oral BID   Or  . metoprolol tartrate  12.5 mg Per Tube BID  . mupirocin ointment  1 application Nasal BID  . omega-3 acid ethyl esters  1 g Oral Daily  . pantoprazole  40 mg Oral Daily  . sodium chloride  3 mL Intravenous Q12H   Continuous Infusions: . sodium chloride Stopped (08/18/14 1030)  . sodium chloride    . sodium chloride 20 mL/hr at 08/17/14 1800  . dexmedetomidine Stopped (08/17/14 2200)  . lactated ringers Stopped (08/19/14 0900)  . nitroGLYCERIN    . phenylephrine (NEO-SYNEPHRINE) Adult infusion Stopped (08/19/14 0300)   PRN Meds:.diclofenac, metoprolol, midazolam, morphine injection,  ondansetron (ZOFRAN) IV, oxyCODONE, sodium chloride, traMADol   Lab Results:  Recent Labs  08/18/14 1603 08/18/14 1610 08/19/14 0450  WBC 13.7*  --  9.9  HGB 8.5* 8.8* 7.0*  PLT 176  --  105*    Recent Labs  08/18/14 0256  08/18/14 1610 08/19/14 0450  NA 136  --  136 130*  K 4.2  --  4.0 3.4*  CL 107  --  99 99  CO2 23  --   --  26  GLUCOSE 149*  --  205* 137*  BUN 12  --  12 12  CREATININE 0.95  < > 1.00 1.13  < > = values in this interval not displayed. Telemetry: sinus tach Personally viewed.   Assessment/Plan:  Active Problems:   CAD (coronary artery disease)   Angina pectoris   Coronary artery disease involving native coronary artery of native heart without angina pectoris   Hyperlipidemia   64 year old post op CABG, anemia, tachycardia  Tachycardia  - now on metoprolol low dose  - should improve with transfusion  CAD  - Plavix, ASA - DES to LAD  Statin  Will follow.   Michael Herrera, [place] 08/19/2014, 12:11 PM

## 2014-08-20 ENCOUNTER — Inpatient Hospital Stay (HOSPITAL_COMMUNITY): Payer: 59

## 2014-08-20 LAB — TYPE AND SCREEN
ABO/RH(D): A POS
Antibody Screen: NEGATIVE
UNIT DIVISION: 0
UNIT DIVISION: 0

## 2014-08-20 LAB — BASIC METABOLIC PANEL
Anion gap: 10 (ref 5–15)
BUN: 19 mg/dL (ref 6–23)
CHLORIDE: 100 meq/L (ref 96–112)
CO2: 26 mmol/L (ref 19–32)
CREATININE: 1.24 mg/dL (ref 0.50–1.35)
Calcium: 8.9 mg/dL (ref 8.4–10.5)
GFR calc Af Amer: 69 mL/min — ABNORMAL LOW (ref >90)
GFR calc non Af Amer: 60 mL/min — ABNORMAL LOW (ref >90)
Glucose, Bld: 96 mg/dL (ref 70–99)
POTASSIUM: 4.9 mmol/L (ref 3.5–5.1)
Sodium: 136 mmol/L (ref 135–145)

## 2014-08-20 LAB — GLUCOSE, CAPILLARY
GLUCOSE-CAPILLARY: 106 mg/dL — AB (ref 70–99)
GLUCOSE-CAPILLARY: 138 mg/dL — AB (ref 70–99)
Glucose-Capillary: 116 mg/dL — ABNORMAL HIGH (ref 70–99)
Glucose-Capillary: 111 mg/dL — ABNORMAL HIGH (ref 70–99)
Glucose-Capillary: 116 mg/dL — ABNORMAL HIGH (ref 70–99)
Glucose-Capillary: 116 mg/dL — ABNORMAL HIGH (ref 70–99)

## 2014-08-20 LAB — CBC
HEMATOCRIT: 22.8 % — AB (ref 39.0–52.0)
HEMOGLOBIN: 8.2 g/dL — AB (ref 13.0–17.0)
MCH: 32.3 pg (ref 26.0–34.0)
MCHC: 36 g/dL (ref 30.0–36.0)
MCV: 89.8 fL (ref 78.0–100.0)
Platelets: 112 10*3/uL — ABNORMAL LOW (ref 150–400)
RBC: 2.54 MIL/uL — ABNORMAL LOW (ref 4.22–5.81)
RDW: 13.6 % (ref 11.5–15.5)
WBC: 14.6 10*3/uL — AB (ref 4.0–10.5)

## 2014-08-20 MED ORDER — POTASSIUM CHLORIDE CRYS ER 20 MEQ PO TBCR
20.0000 meq | EXTENDED_RELEASE_TABLET | Freq: Two times a day (BID) | ORAL | Status: DC
  Administered 2014-08-20 – 2014-08-21 (×4): 20 meq via ORAL
  Filled 2014-08-20 (×7): qty 1

## 2014-08-20 MED ORDER — FUROSEMIDE 40 MG PO TABS: 40.0000 mg | ORAL_TABLET | Freq: Two times a day (BID) | ORAL | Status: DC

## 2014-08-20 MED ORDER — FUROSEMIDE 40 MG PO TABS
40.0000 mg | ORAL_TABLET | Freq: Two times a day (BID) | ORAL | Status: DC
  Administered 2014-08-20: 40 mg via ORAL
  Filled 2014-08-20 (×2): qty 1

## 2014-08-20 MED ORDER — FUROSEMIDE 40 MG PO TABS
40.0000 mg | ORAL_TABLET | Freq: Two times a day (BID) | ORAL | Status: DC
  Filled 2014-08-20 (×3): qty 1

## 2014-08-20 MED ORDER — FUROSEMIDE 40 MG PO TABS
40.0000 mg | ORAL_TABLET | Freq: Every day | ORAL | Status: DC
  Administered 2014-08-21 – 2014-08-22 (×2): 40 mg via ORAL
  Filled 2014-08-20 (×2): qty 1

## 2014-08-20 MED ORDER — ASPIRIN EC 81 MG PO TBEC
81.0000 mg | DELAYED_RELEASE_TABLET | Freq: Every day | ORAL | Status: DC
  Administered 2014-08-20 – 2014-08-22 (×3): 81 mg via ORAL
  Filled 2014-08-20 (×3): qty 1

## 2014-08-20 NOTE — Progress Notes (Signed)
64 Pt has walked twice with nursing staff today. We will follow up tomorrow. Graylon Good RN BSN 08/20/2014 3:12 PM

## 2014-08-20 NOTE — Progress Notes (Signed)
Pt. Ambulating with front wheeled rolling walker, oxygen (4 Liters) and with 2 nursing staff as support when his color became ashen and he was not able to respond to my question, "are you ok?".  He leaned forward twice and was unable to right himself.  Does not remember nursing easing him to a sitting position on the floor.  No injuries noted.  MD notified.  NNO   Will continue to monitor throughout the day.  Will utilize gaitbelt while ambulating for greater stability.  Pt. Very cautious re:  Further ambulation attempts due to this "setback".  Required emotional support, which he responds to in a positive fashion.  Affect remains flat, however.

## 2014-08-20 NOTE — Progress Notes (Signed)
      [address]       ,[place] [address]             [phone]       3 Days Post-Op Procedure(s) (LRB): CORONARY ARTERY BYPASS GRAFTING (CABG) (N/A)   Subjective:  Mr. Ellner feels a little better after transfusion yesterday.  He states he does not feel he is being walked enough by nursing staff.   Objective: Vital signs in last 24 hours: Temp:  [97.8 F (36.6 C)-98.4 F (36.9 C)] 98.1 F (36.7 C) (01/14 0700) Pulse Rate:  [112-126] 112 (01/14 0700) Cardiac Rhythm:  [-] Sinus tachycardia (01/13 2110) Resp:  [18-32] 20 (01/14 0700) BP: (92-117)/(53-72) 92/61 mmHg (01/14 0700) SpO2:  [92 %-97 %] 97 % (01/14 0700) Arterial Line BP: (141-147)/(60-61) 147/61 mmHg (01/13 1000)  Intake/Output from previous day: 01/13 0701 - 01/14 0700 In: 660 [P.O.:100; I.V.:60; Blood:300; IV Piggyback:200] Out: 576 [Urine:576]  General appearance: alert, cooperative and no distress Heart: regular rate and rhythm Lungs: diminished breath sounds bibasilar Abdomen: soft, non-tender; bowel sounds normal; no masses,  no organomegaly Extremities: edema trace Wound: clean and dry  Lab Results:  Recent Labs  08/19/14 0450 08/20/14 0528  WBC 9.9 14.6*  HGB 7.0* 8.2*  HCT 19.9* 22.8*  PLT 105* 112*   BMET:  Recent Labs  08/19/14 0450 08/20/14 0528  NA 130* 136  K 3.4* 4.9  CL 99 100  CO2 26 26  GLUCOSE 137* 96  BUN 12 19  CREATININE 1.13 1.24  CALCIUM 8.2* 8.9    PT/INR:  Recent Labs  08/17/14 1700  LABPROT 18.2*  INR 1.49   ABG    Component Value Date/Time   PHART 7.330* 08/18/2014 0022   HCO3 18.7* 08/18/2014 0022   TCO2 17 08/18/2014 1610   ACIDBASEDEF 7.0* 08/18/2014 0022   O2SAT 92.0 08/18/2014 0022   CBG (last 3)   Recent Labs  08/19/14 1127 08/19/14 1609 08/19/14 2224  GLUCAP 121* 159* 116*    Assessment/Plan: S/P Procedure(s) (LRB): CORONARY ARTERY BYPASS GRAFTING (CABG) (N/A)  1. CV- Sinus Tach- on Lopressor at 12.5 mg BID,  not much room to increase this with labile blood pressure 2. Pulm- wean oxygen as tolerated, moderate pleural effusions bilaterally on CXR, continue IS 3. Renal- creatinine at 1.24, + hypervolemia weight is up 18 lbs will start oral Lasix 4. Expected post operative blood loss anemia- Hgb 8.2 after 1 unit of blood yesterday 5. Dispo- patient with some improvement, still feels a little weak Hgb at 8.2 may benefit from additional unit, will diurese for hypervolemia, d/c pacing wires   LOS: 6 days    Ahmed Prima, Junie Panning 08/20/2014

## 2014-08-21 LAB — BASIC METABOLIC PANEL
ANION GAP: 9 (ref 5–15)
BUN: 25 mg/dL — ABNORMAL HIGH (ref 6–23)
CO2: 27 mmol/L (ref 19–32)
Calcium: 8.7 mg/dL (ref 8.4–10.5)
Chloride: 100 mEq/L (ref 96–112)
Creatinine, Ser: 1.19 mg/dL (ref 0.50–1.35)
GFR calc Af Amer: 73 mL/min — ABNORMAL LOW (ref >90)
GFR, EST NON AFRICAN AMERICAN: 63 mL/min — AB (ref >90)
Glucose, Bld: 111 mg/dL — ABNORMAL HIGH (ref 70–99)
POTASSIUM: 4.3 mmol/L (ref 3.5–5.1)
SODIUM: 136 mmol/L (ref 135–145)

## 2014-08-21 LAB — GLUCOSE, CAPILLARY
GLUCOSE-CAPILLARY: 114 mg/dL — AB (ref 70–99)
Glucose-Capillary: 94 mg/dL (ref 70–99)
Glucose-Capillary: 127 mg/dL — ABNORMAL HIGH (ref 70–99)
Glucose-Capillary: 118 mg/dL — ABNORMAL HIGH (ref 70–99)

## 2014-08-21 MED ORDER — POLYETHYLENE GLYCOL 3350 17 G PO PACK
17.0000 g | PACK | Freq: Every day | ORAL | Status: DC | PRN
  Administered 2014-08-21: 17 g via ORAL
  Filled 2014-08-21 (×2): qty 1

## 2014-08-21 MED ORDER — LACTULOSE 10 GM/15ML PO SOLN
30.0000 g | Freq: Once | ORAL | Status: AC
  Administered 2014-08-21: 30 g via ORAL
  Filled 2014-08-21: qty 45

## 2014-08-21 MED ORDER — METOPROLOL TARTRATE 25 MG/10 ML ORAL SUSPENSION
25.0000 mg | Freq: Two times a day (BID) | ORAL | Status: DC
  Filled 2014-08-21 (×4): qty 10

## 2014-08-21 MED ORDER — METOPROLOL TARTRATE 25 MG PO TABS
25.0000 mg | ORAL_TABLET | Freq: Two times a day (BID) | ORAL | Status: DC
  Administered 2014-08-21 – 2014-08-22 (×3): 25 mg via ORAL
  Filled 2014-08-21 (×4): qty 1

## 2014-08-21 MED ORDER — FE FUMARATE-B12-VIT C-FA-IFC PO CAPS: 1.0000 | ORAL_CAPSULE | Freq: Three times a day (TID) | ORAL | Status: DC

## 2014-08-21 NOTE — Progress Notes (Addendum)
      [address]       [place], [address]             [phone]      4 Days Post-Op Procedure(s) (LRB): CORONARY ARTERY BYPASS GRAFTING (CABG) (N/A)   Subjective:  Michael Herrera states he feels okay, but continues to feel dizzy and weak.  He had a near syncopal episode yesterday afternoon with ambulation.  No BM  Objective: Vital signs in last 24 hours: Temp:  [97.5 F (36.4 C)-99.2 F (37.3 C)] 98.3 F (36.8 C) (01/15 0601) Pulse Rate:  [120-132] 129 (01/15 0601) Cardiac Rhythm:  [-] Sinus tachycardia (01/14 2344) Resp:  [18-22] 18 (01/15 0601) BP: (93-129)/(51-72) 129/72 mmHg (01/15 0601) SpO2:  [93 %-97 %] 94 % (01/15 0601) Weight:  [155 lb 8 oz (70.534 kg)] 155 lb 8 oz (70.534 kg) (01/15 0601)  Intake/Output from previous day: 01/14 0701 - 01/15 0700 In: 240 [P.O.:240] Out: 550 [Urine:550]  General appearance: alert, cooperative and no distress Heart: regular rate and rhythm and tachy Lungs: clear to auscultation bilaterally Abdomen: soft, non-tender; bowel sounds normal; no masses,  no organomegaly Extremities: edema 2+ pitting Wound: clean and dry R>L  Lab Results:  Recent Labs  08/19/14 0450 08/20/14 0528  WBC 9.9 14.6*  HGB 7.0* 8.2*  HCT 19.9* 22.8*  PLT 105* 112*   BMET:  Recent Labs  08/20/14 0528 08/21/14 0614  NA 136 136  K 4.9 4.3  CL 100 100  CO2 26 27  GLUCOSE 96 111*  BUN 19 25*  CREATININE 1.24 1.19  CALCIUM 8.9 8.7    PT/INR: No results for input(s): LABPROT, INR in the last 72 hours. ABG    Component Value Date/Time   PHART 7.330* 08/18/2014 0022   HCO3 18.7* 08/18/2014 0022   TCO2 17 08/18/2014 1610   ACIDBASEDEF 7.0* 08/18/2014 0022   O2SAT 92.0 08/18/2014 0022   CBG (last 3)   Recent Labs  08/20/14 1648 08/20/14 2157 08/21/14 0600  GLUCAP 138* 116* 94    Assessment/Plan: S/P Procedure(s) (LRB): CORONARY ARTERY BYPASS GRAFTING (CABG) (N/A)  1. CV- Sinus Tach- lopressor increased to 25 mg  daily, continue ASA, Plavis for DES 2. Pulm- Wean oxygen as tolerated, continue IS 3. Renal- remains hypervolemic weight is up 16 lbs since admission, on oral Lasix 4. Expected post operative blood loss anemia moderate, Hgb 8.2 after transfusion, will start iron 5. Dispo- patient with continued dizziness with ambulation, near syncopal episode yesterday, remains tachycardic lopressor increased, will start iron for anemia  LOS: 7 days    Michael Herrera 08/21/2014  Looks better today His BP is better- will increase lopressor dose No iron He is having flatus but has not had a BM yet- lactulose, if no result with that will give miralax

## 2014-08-21 NOTE — Progress Notes (Signed)
CARDIAC REHAB PHASE I   PRE:  Rate/Rhythm: 127 ST  BP:  Supine:   Sitting: 122/65  Standing: 106/60   SaO2: 96 3L  MODE:  Ambulation: 200 ft   POST:  Rate/Rhythm: 145   BP:  Supine:   Sitting: 119/76  Standing:    SaO2: 95 3L [id number] Assisted X 2 used rollator, gait belt and O2 3L to ambulate.On arrival pt very anxious about falling his BP and his medication being increased. We reassured pt that we are monitoring these things.  Gait steady with rollator. He denies any dizziness with standing or walking. HR walking 145. Pt back to chair after walk with call light in reach. O2 decreased to 2L at end of walk, reported to RN.  Rodney Langton RN 08/21/2014 10:09 AM

## 2014-08-22 LAB — BASIC METABOLIC PANEL
Anion gap: 10 (ref 5–15)
BUN: 29 mg/dL — ABNORMAL HIGH (ref 6–23)
CO2: 26 mmol/L (ref 19–32)
Calcium: 8.6 mg/dL (ref 8.4–10.5)
Chloride: 102 mEq/L (ref 96–112)
Creatinine, Ser: 1.14 mg/dL (ref 0.50–1.35)
GFR calc Af Amer: 77 mL/min — ABNORMAL LOW (ref >90)
GFR, EST NON AFRICAN AMERICAN: 66 mL/min — AB (ref >90)
Glucose, Bld: 110 mg/dL — ABNORMAL HIGH (ref 70–99)
Potassium: 4.6 mmol/L (ref 3.5–5.1)
SODIUM: 138 mmol/L (ref 135–145)

## 2014-08-22 LAB — GLUCOSE, CAPILLARY
GLUCOSE-CAPILLARY: 170 mg/dL — AB (ref 70–99)
Glucose-Capillary: 110 mg/dL — ABNORMAL HIGH (ref 70–99)

## 2014-08-22 MED ORDER — FUROSEMIDE 40 MG PO TABS: 40.0000 mg | ORAL_TABLET | Freq: Every day | ORAL | Status: DC

## 2014-08-22 MED ORDER — POTASSIUM CHLORIDE CRYS ER 20 MEQ PO TBCR
20.0000 meq | EXTENDED_RELEASE_TABLET | Freq: Every day | ORAL | Status: DC
  Administered 2014-08-22: 20 meq via ORAL

## 2014-08-22 MED ORDER — METOPROLOL TARTRATE 25 MG PO TABS: 25.0000 mg | ORAL_TABLET | Freq: Two times a day (BID) | ORAL | Status: DC

## 2014-08-22 MED ORDER — OXYCODONE HCL 5 MG PO TABS: 5.0000 mg | ORAL_TABLET | ORAL | Status: DC | PRN

## 2014-08-22 MED ORDER — FUROSEMIDE 40 MG PO TABS
40.0000 mg | ORAL_TABLET | Freq: Every day | ORAL | Status: DC
Start: 2014-08-22 — End: 2014-08-31

## 2014-08-22 MED ORDER — POTASSIUM CHLORIDE CRYS ER 10 MEQ PO TBCR: 10.0000 meq | EXTENDED_RELEASE_TABLET | Freq: Every day | ORAL | Status: DC

## 2014-08-22 MED ORDER — POTASSIUM CHLORIDE CRYS ER 20 MEQ PO TBCR: 20.0000 meq | EXTENDED_RELEASE_TABLET | Freq: Every day | ORAL | Status: DC

## 2014-08-22 NOTE — Progress Notes (Signed)
Patient ambulated in hall using front wheel walker. No oxygen needed. O2 sats maintained 93-100%. Patient tolerated well. Will continue to monitor.  Domingo Dimes RN

## 2014-08-22 NOTE — Progress Notes (Signed)
CARDIAC REHAB PHASE I   PRE:  Rate/Rhythm: 122 sinus tach  BP:  Supine:   Sitting: 112/65  Standing:    SaO2: 90% ra  MODE:  Ambulation: 550 ft   POST:  Rate/Rhythem: 130 sinus tach  BP:  Supine:   Sitting: 121/70  Standing:    SaO2: 95% ra  [phone]   Pt ambulated in hallway x1 assist using rolling walker and gait belt.  Slow steady gait. Pt asymptomatic. Pt encouraged in IS use and good pulmonary hygiene.  Sternal precautions reviewed.  Pt given information about watching [facility] home care video.   Education completed including risk factor modification, low fat-low cholesterol diet, exercise, and medication compliance.  Pt oriented to outpatient cardiac rehab.  At pt request, referral will be sent to [facility].  Understanding verbalized   Carolyne Littles

## 2014-08-22 NOTE — Discharge Summary (Signed)
Physician Discharge Summary       [address]       [place] [address]             [phone]    Patient ID: Michael Herrera MRN: [id number] DOB/AGE: 01/10/1951 64 y.o.  Admit date: 08/14/2014 Discharge date: 08/22/2014  Admission Diagnoses: 1. History of CAD (s/p PCI with DES stent, left main disease included) 2. History of hyperlipidemia  Discharge Diagnoses:  1. History of CAD (s/p PCI with DES stent, left main disease included) 2. History of hyperlipidemia 3. ABL anemia 4. Mild thrombocytopenia  Procedure (s):  Cardiac Catheterization done by Dr. Irish Lack on 08/14/2014: ANGIOGRAPHIC DATA: The left main coronary artery is very short and appears moderately diseased.  The left anterior descending artery is a large vessel. THe ostium has moderate calcific disease. The stent is widely patent. The first diagonal has an ostial 80% stenosis.  The left circumflex artery is a large vessel with a 90% ostial stenosis. The OM1 has a 50% stenosis. The remainder of the circumflex is patent.  The right coronary artery is a large dominant vessel. The mid vessel stent is widely patent. Posterior lateral artery and posterior descending artery are large and widely patent.  LEFT VENTRICULOGRAM: Left ventricular angiogram was done in the 30 RAO projection and revealed normal left ventricular wall motion and systolic function with an estimated ejection fraction of 55%. LVEDP was 3 mmHg.  INTRAVASCULAR ULTRASOUND: An 5 [place] EBU 3 guiding catheter was used to engage the left main. Heparin was given for anticoagulation. ACT was used to check that the heparin was therapeutic. Pro-water was placed into the LAD. A 5 French IVUS catheter was advanced to the proximal LAD. Pullback was performed. This showed severe, calcific disease of the ostial LAD. The cross-sectional area was 3.5 m.  The wire and catheter removed. Final angiography showed TIMI-3 flow in both the LAD and the  circumflex. This did better show the lesion being in the very ostium of the circumflex and involving the distal left main and ostial LAD.  IMPRESSIONS:  1. Short, moderately diseased left main coronary artery. 2. Moderate disease in the ostial left anterior descending artery. Severe disease in the ostial first diagonal branch. Intravascular ultrasound showed a cross-sectional area of 3.5 mm and the ostial LAD. 3. Severe disease in the ostial left circumflex artery extending back to the left main and the ostial LAD. 4. Patent stent in the mid right coronary artery. No significant disease. 5. Normal left ventricular systolic function. LVEDP 3 mmHg. Ejection fraction 55%.  1. Median sternotomy. 2. Extracorporeal circulation. 3. Coronary artery bypass grafting x3 (left internal mammary artery to  left anterior descending, saphenous vein graft to second diagonal,  saphenous vein graft to obtuse marginal 1). 4. Endoscopic vein harvest, right thigh by Dr. [name] on 08/17/2014.  History of Presenting Illness: This is a 64 yo man with a history dyslipidemia and of CAD dating back to 2001. He had stents placed in the RCA in 2001. He did well until March of 2015 when he had recurrent angina. A stress test was unremarkable but he had persistent symptoms and underwent catheterization. He had a 90% proximal LAD stenosis and a DES was placed. He again did well after that procedure until a couple of weeks ago, when had recurrent exertional angina. He describes this as a dull pain that begins in his chest and radiates to his back and his teeth. He has been having symptoms  daily. It has been relieved by rest and is not associated with nausea, vomiting, shortness of breath, or diapheresis.  He was scheduled for cardiac catheterization and that was performed 08/14/2014. He was found to have progression of his left main disease and a critical lesion at the takeoff of the circumflex. His LAD and right  coronary stents are patent. He does have stenoses in a first diagonal branch and his first obtuse marginal, which were present previously. His ejection fraction was 55%.  Mr. Koren denies any significant family history of CAD. He does have a history of dyslipidemia. He denies hypertension. He is a lifelong nonsmoker. He eats a heart healthy diet and exercises on a daily basis, walking 2-3 miles a day. He is retired [employer] pilot.  Dr. [name] discussed the need for coronary artery bypass grafting surgery. Potential risks, benefits, and complications were discussed with the patient and he agreed to proceed with surgery. Pre operative carotid duplex US showed no significant carotid artery stenosis bilaterally. He underwent a CABG x 3 on 08/17/2014.  Brief Hospital Course:  The patient was extubated late the evening of surgery without difficulty. He remained afebrile.[name] He was weaned off Neo synephrine drip. Gordy Councilman, a line, chest tubes, and foley were removed early in the post operative course. Lopressor was started and titrated accordingly. He did have ABL anemia. His H and H went as low as 7 and 19.9. He was given one unit of PRBC. His last H and H was up to 8.2 and 22.8. Per Dr. [name], no oral iron was to be started. He also had thrombocytopenia. His last platelets were up to 112,000. He was volume over loaded and diuresed. He was weaned off the insulin drip. he patient's HGA1C pre op was 5.6. The patient was felt surgically stable for transfer from the ICU to [facility] for further convalescence on 08/21/2063. He continues to progress with cardiac rehab. He was ambulating on room air. He has been tolerating a diet and has had a bowel movement. Epicardial pacing wires and chest tube sutures will be removed prior to discharge. He is slightly tachycardic and has been post op. He is on Lopressor 25 bid with good BP control. His tachycardia is likely related to his anemia. He had previous dizziness as well  as near syncope. This has resolved. He is felt surgically stable for discharge today.  Latest Vital Signs: Blood pressure 122/67, pulse 109, temperature 98.1 F (36.7 C), temperature source Oral, resp. rate 20, height 5\' 9"  (1.753 m), weight 152 lb 12.5 oz (69.3 kg), SpO2 92 %.  Physical Exam: Cardiovascular: Slightly tachy Pulmonary: Clear to auscultation bilaterally; no rales, wheezes, or rhonchi. Abdomen: Soft, non tender, bowel sounds present. Extremities: Mild bilateral lower extremity edema R>L Wounds: Clean and dry. No erythema or signs of infection.  Discharge Condition:Stable  Recent laboratory studies:  Lab Results  Component Value Date   WBC 14.6* 08/20/2014   HGB 8.2* 08/20/2014   HCT 22.8* 08/20/2014   MCV 89.8 08/20/2014   PLT 112* 08/20/2014   Lab Results  Component Value Date   NA 138 08/22/2014   K 4.6 08/22/2014   CL 102 08/22/2014   CO2 26 08/22/2014   CREATININE 1.14 08/22/2014   GLUCOSE 110* 08/22/2014      Diagnostic Studies: Dg Chest 2 View  08/20/2014   CLINICAL DATA:  Shortness of breath.  Atelectasis.  EXAM: CHEST  2 VIEW  COMPARISON:  08/19/2014 and 08/17/2004  FINDINGS: The patient has developed  moderate bilateral pleural effusions. Improved atelectasis at the bases.  Heart size and vascularity are normal. Central venous sheath has been removed. CABG.  IMPRESSION: New moderate bilateral pleural effusions.   Electronically Signed   By: Rozetta Nunnery M.D.   On: 08/20/2014 08:05    Discharge Instructions    Amb Referral to Cardiac Rehabilitation    Complete by:  As directed            Discharge Medications:   Medication List    STOP taking these medications        nitroGLYCERIN 0.4 MG SL tablet  Commonly known as:  NITROSTAT     vitamin E 400 UNIT capsule      TAKE these medications        aspirin 81 MG tablet  Take 81 mg by mouth every evening.     atorvastatin 80 MG tablet  Commonly known as:  LIPITOR  Take 1 tablet (80 mg  total) by mouth daily.     clopidogrel 75 MG tablet  Commonly known as:  PLAVIX  Take 1 tablet (75 mg total) by mouth daily with breakfast. STOP MARCH 2016     diclofenac 75 MG EC tablet  Commonly known as:  VOLTAREN  Take 75 mg by mouth daily as needed (back pain).     esomeprazole 20 MG capsule  Commonly known as:  NEXIUM  Take 20 mg by mouth every morning.     ezetimibe 10 MG tablet  Commonly known as:  ZETIA  Take 1 tablet (10 mg total) by mouth daily.     Fish Oil 1200 MG Caps  Take 1,200 mg by mouth every morning.     furosemide 40 MG tablet  Commonly known as:  LASIX  Take 1 tablet (40 mg total) by mouth daily. For one week then stop     metoprolol tartrate 25 MG tablet  Commonly known as:  LOPRESSOR  Take 1 tablet (25 mg total) by mouth 2 (two) times daily.     oxyCODONE 5 MG immediate release tablet  Commonly known as:  Oxy IR/ROXICODONE  Take 1-2 tablets (5-10 mg total) by mouth every 4 (four) hours as needed for severe pain.     potassium chloride SA 20 MEQ tablet  Commonly known as:  K-DUR,KLOR-CON  Take 1 tablet (20 mEq total) by mouth daily. For one week then stop.     vitamin C 500 MG tablet  Commonly known as:  ASCORBIC ACID  Take 500 mg by mouth every morning.       The patient has been discharged on:   1.Beta Blocker:  Yes [x   ]                              No   [   ]                              If No, reason:  2.Ace Inhibitor/ARB: Yes [   ]                                     No  [  x  ]  If No, reason: Labile BP  3.Statin:   Yes [  x ]                  No  [   ]                  If No, reason:  4.Ecasa:  Yes  [ x  ]                  No   [   ]                  If No, reason: Follow Up Appointments:     Follow-up Information    Follow up with Sueanne Margarita, MD.   Specialty:  Cardiology   Why:  Call for a follow up appointment for 2 weeks   Contact information:   [address] [place] [address] [phone]       Follow up with Melrose Nakayama, MD.   Specialty:  Cardiothoracic Surgery   Why:  PA/LAT CXR to be taken (at [facility] which is in the same building as Dr. Leonarda Salon office) one hour prior to office appointment with Dr. Theotis Burrow will mail appointment date and time   Contact information:   [address] [place] [address] [phone]       Signed: Lars Pinks MPA-C 08/22/2014, 11:29 AM

## 2014-08-22 NOTE — Progress Notes (Signed)
   Eager to go home. Had several BM yesterday after laxative. No further dizziness.   Objective:  Vital Signs in the last 24 hours: Temp:  [98.1 F (36.7 C)-98.9 F (37.2 C)] 98.1 F (36.7 C) (01/16 0500) Pulse Rate:  [109-128] 109 (01/16 0500) Resp:  [18-20] 20 (01/16 0500) BP: (103-122)/(55-75) 122/67 mmHg (01/16 0500) SpO2:  [91 %-96 %] 92 % (01/16 0500) Weight:  [152 lb 12.5 oz (69.3 kg)] 152 lb 12.5 oz (69.3 kg) (01/16 0500)  Intake/Output from previous day: 01/15 0701 - 01/16 0700 In: 240 [P.O.:240] Out: -     Scheduled Meds: . acetaminophen  1,000 mg Oral 4 times per day   Or  . acetaminophen (TYLENOL) oral liquid 160 mg/5 mL  1,000 mg Per Tube 4 times per day  . aspirin EC  81 mg Oral Daily  . atorvastatin  80 mg Oral q1800  . bisacodyl  10 mg Oral Daily   Or  . bisacodyl  10 mg Rectal Daily  . clopidogrel  75 mg Oral Q breakfast  . docusate sodium  200 mg Oral Daily  . enoxaparin (LOVENOX) injection  40 mg Subcutaneous QHS  . ezetimibe  10 mg Oral Daily  . furosemide  40 mg Oral Daily  . insulin aspart  0-15 Units Subcutaneous TID WC  . metoprolol tartrate  25 mg Oral BID   Or  . metoprolol tartrate  25 mg Per Tube BID  . omega-3 acid ethyl esters  1 g Oral Daily  . pantoprazole  40 mg Oral Daily  . potassium chloride  20 mEq Oral Daily  . sodium chloride  3 mL Intravenous Q12H     Lab Results:  Recent Labs  08/20/14 0528  WBC 14.6*  HGB 8.2*  PLT 112*    Recent Labs  08/21/14 0614 08/22/14 0425  NA 136 138  K 4.3 4.6  CL 100 102  CO2 27 26  GLUCOSE 111* 110*  BUN 25* 29*  CREATININE 1.19 1.14   Telemetry: sinus tach Personally viewed.   Assessment/Plan:  Active Problems:   CAD (coronary artery disease)   Angina pectoris   Coronary artery disease involving native coronary artery of native heart without angina pectoris   Hyperlipidemia   64 year old post op CABG, anemia, tachycardia  Tachycardia  - on metoprolol  - persistent  not symptomatic  - no rub  - should improve with time   CAD  - Plavix, ASA - DES to LAD  Statin  Hopeful DC today or tomorrow (patient very eager to go home).   Londyn Hotard, [place] 08/22/2014, 9:42 AM

## 2014-08-22 NOTE — Progress Notes (Signed)
Patient weaned from O2. Patient sats maintained 88-93%. Patient tolerated well, but needed to be reminded to breath at times. Patient placed back on O2 for the night and educated on using incentive spirometer. Will continue to monitor.  Domingo Dimes RN

## 2014-08-22 NOTE — Discharge Instructions (Signed)
Activity: 1.May walk up steps                2.No lifting more than ten pounds for four weeks.                 3.No driving for four weeks.                4.Stop any activity that causes chest pain, shortness of breath, dizziness,sweating or excessive weakness.                5.Avoid straining.                6.Continue with your breathing exercises daily.  Diet: Low fat, Low salt diet  Wound Care: May shower.  Clean wounds with mild soap and water daily. Contact the office at [phone] if any problems arise.  Coronary Artery Bypass Grafting, Care After Refer to this sheet in the next few weeks. These instructions provide you with information on caring for yourself after your procedure. Your health care provider may also give you more specific instructions. Your treatment has been planned according to current medical practices, but problems sometimes occur. Call your health care provider if you have any problems or questions after your procedure. WHAT TO EXPECT AFTER THE PROCEDURE Recovery from surgery will be different for everyone. Some people feel well after 3 or 4 weeks, while for others it takes longer. After your procedure, it is typical to have the following:  Nausea and a lack of appetite.   Constipation.  Weakness and fatigue.   Depression or irritability.   Pain or discomfort at your incision site. HOME CARE INSTRUCTIONS  Take medicines only as directed by your health care provider. Do not stop taking medicines or start any new medicines without first checking with your health care provider.  Take your pulse as directed by your health care provider.  Perform deep breathing as directed by your health care provider. If you were given a device called an incentive spirometer, use it to practice deep breathing several times a day. Support your chest with a pillow or your arms when you take deep breaths or cough.  Keep incision areas clean, dry, and protected. Remove or  change any bandages (dressings) only as directed by your health care provider. You may have skin adhesive strips over the incision areas. Do not take the strips off. They will fall off on their own.  Check incision areas daily for any swelling, redness, or drainage.  If incisions were made in your legs, do the following:  Avoid crossing your legs.   Avoid sitting for long periods of time. Change positions every 30 minutes.   Elevate your legs when you are sitting.  Wear compression stockings as directed by your health care provider. These stockings help keep blood clots from forming in your legs.  Take showers once your health care provider approves. Until then, only take sponge baths. Pat incisions dry. Do not rub incisions with a washcloth or towel. Do not take baths, swim, or use a hot tub until your health care provider approves.  Eat foods that are high in fiber, such as raw fruits and vegetables, whole grains, beans, and nuts. Meats should be lean cut. Avoid canned, processed, and fried foods.  Drink enough fluid to keep your urine clear or pale yellow.  Weigh yourself every day. This helps identify if you are retaining fluid that may make your heart and lungs work harder.  Rest  and limit activity as directed by your health care provider. You may be instructed to:  Stop any activity at once if you have chest pain, shortness of breath, irregular heartbeats, or dizziness. Get help right away if you have any of these symptoms.  Move around frequently for short periods or take short walks as directed by your health care provider. Increase your activities gradually. You may need physical therapy or cardiac rehabilitation to help strengthen your muscles and build your endurance.  Avoid lifting, pushing, or pulling anything heavier than 10 lb (4.5 kg) for at least 6 weeks after surgery.  Do not drive until your health care provider approves.  Ask your health care provider when you  may return to work.  Ask your health care provider when you may resume sexual activity.  Keep all follow-up visits as directed by your health care provider. This is important. SEEK MEDICAL CARE IF:  You have swelling, redness, increasing pain, or drainage at the site of an incision.  You have a fever.  You have swelling in your ankles or legs.  You have pain in your legs.   You gain 2 or more pounds (0.9 kg) a day.  You are nauseous or vomit.  You have diarrhea. SEEK IMMEDIATE MEDICAL CARE IF:  You have chest pain that goes to your jaw or arms.  You have shortness of breath.   You have a fast or irregular heartbeat.   You notice a "clicking" in your breastbone (sternum) when you move.   You have numbness or weakness in your arms or legs.  You feel dizzy or light-headed.  MAKE SURE YOU:  Understand these instructions.  Will watch your condition.  Will get help right away if you are not doing well or get worse. Document Released: 02/10/2005 Document Revised: 12/08/2013 Document Reviewed: 12/31/2012 [facility] Patient Information 2015 [place], [place]. This information is not intended to replace advice given to you by your health care provider. Make sure you discuss any questions you have with your health care provider.

## 2014-08-22 NOTE — Progress Notes (Addendum)
      [address]       [place] [address]             [phone]        5 Days Post-Op Procedure(s) (LRB): CORONARY ARTERY BYPASS GRAFTING (CABG) (N/A)  Subjective: Patient with large bowel movement. He has had no further dizziness nor near syncope and wants to go home.  Objective: Vital signs in last 24 hours: Temp:  [98.1 F (36.7 C)-98.9 F (37.2 C)] 98.1 F (36.7 C) (01/16 0500) Pulse Rate:  [109-128] 109 (01/16 0500) Cardiac Rhythm:  [-] Sinus tachycardia (01/16 0828) Resp:  [18-20] 20 (01/16 0500) BP: (103-122)/(55-75) 122/67 mmHg (01/16 0500) SpO2:  [91 %-96 %] 92 % (01/16 0500) Weight:  [152 lb 12.5 oz (69.3 kg)] 152 lb 12.5 oz (69.3 kg) (01/16 0500)  Pre op weight 63 kg Current Weight  08/22/14 152 lb 12.5 oz (69.3 kg)      Intake/Output from previous day: 01/15 0701 - 01/16 0700 In: 240 [P.O.:240] Out: -    Physical Exam:  Cardiovascular: Slightly tachy Pulmonary: Clear to auscultation bilaterally; no rales, wheezes, or rhonchi. Abdomen: Soft, non tender, bowel sounds present. Extremities: Mild bilateral lower extremity edema R>L Wounds: Clean and dry.  No erythema or signs of infection.  Lab Results: CBC: Recent Labs  08/20/14 0528  WBC 14.6*  HGB 8.2*  HCT 22.8*  PLT 112*   BMET:  Recent Labs  08/21/14 0614 08/22/14 0425  NA 136 138  K 4.3 4.6  CL 100 102  CO2 27 26  GLUCOSE 111* 110*  BUN 25* 29*  CREATININE 1.19 1.14  CALCIUM 8.7 8.6    PT/INR:  Lab Results  Component Value Date   INR 1.49 08/17/2014   INR 1.1* 08/11/2014   INR 1.1* 10/27/2013   ABG:  INR: Will add last result for INR, ABG once components are confirmed Will add last 4 CBG results once components are confirmed  Assessment/Plan:  1. CV - ST in low 100's. On Lopressor 25 bid and Plavix 75 daily. Will likely start ACE or ARB after discharge 2.  Pulmonary - On room air. Encourage incentive spirometer 3. Volume Overload - On Lasix 40  daily 4.  Acute blood loss anemia - Last H and H 8.2 and 22.8. Previously transfused one unit. Per Dr. [name], no oral iron. Tachycardia could be related to anemia. 5. Thrombocytopenia-Last platelets 112,000 6. CBG 118/114/110. Pre op HGA1C 5.6. Stop accu checks and SS 7. Per discussion with Dr. Prescott Gum, ok to discharge  Nevin Grizzle MPA-C 08/22/2014,9:37 AM

## 2014-08-24 ENCOUNTER — Other Ambulatory Visit: Payer: Self-pay | Admitting: *Deleted

## 2014-08-24 DIAGNOSIS — G8918 Other acute postprocedural pain: Secondary | ICD-10-CM

## 2014-08-24 MED ORDER — TRAMADOL HCL 50 MG PO TABS: 50.0000 mg | ORAL_TABLET | Freq: Four times a day (QID) | ORAL | Status: DC | PRN

## 2014-08-24 NOTE — Telephone Encounter (Signed)
Mrs. Highley called to relate that Oxycodone is too strong for her husband.  He is requesting the Tramadol that was given to him instead. It works better and doesn't cause as much light headedness as the Oxycodone did.  I said that I would have a RX signed and faxed to their preferred pharmacy and she agreed. He is s/p CABG and was discharged Saturday 08/22/14.

## 2014-08-31 ENCOUNTER — Ambulatory Visit (INDEPENDENT_AMBULATORY_CARE_PROVIDER_SITE_OTHER): Payer: 59 | Admitting: Cardiology

## 2014-08-31 ENCOUNTER — Encounter: Payer: Self-pay | Admitting: Cardiology

## 2014-08-31 VITALS — BP 116/68 | HR 96 | Ht 69.0 in | Wt 146.4 lb

## 2014-08-31 DIAGNOSIS — I2510 Atherosclerotic heart disease of native coronary artery without angina pectoris: Secondary | ICD-10-CM

## 2014-08-31 DIAGNOSIS — D62 Acute posthemorrhagic anemia: Secondary | ICD-10-CM

## 2014-08-31 DIAGNOSIS — R609 Edema, unspecified: Secondary | ICD-10-CM

## 2014-08-31 DIAGNOSIS — I6523 Occlusion and stenosis of bilateral carotid arteries: Secondary | ICD-10-CM

## 2014-08-31 DIAGNOSIS — R600 Localized edema: Secondary | ICD-10-CM

## 2014-08-31 DIAGNOSIS — E785 Hyperlipidemia, unspecified: Secondary | ICD-10-CM

## 2014-08-31 DIAGNOSIS — I2584 Coronary atherosclerosis due to calcified coronary lesion: Secondary | ICD-10-CM

## 2014-08-31 HISTORY — DX: Localized edema: R60.0

## 2014-08-31 HISTORY — DX: Acute posthemorrhagic anemia: D62

## 2014-08-31 MED ORDER — FUROSEMIDE 20 MG PO TABS: 20.0000 mg | ORAL_TABLET | Freq: Every day | ORAL | Status: DC

## 2014-08-31 NOTE — Progress Notes (Signed)
Cardiology Office Note   Date:  08/31/2014   ID:  Michael Herrera, DOB [birth year], MRN [id number]  PCP:  Abigail Miyamoto, MD  Cardiologist:   Sueanne Margarita, MD   Chief Complaint  Patient presents with  . Coronary Artery Disease      History of Present Illness: Michael Herrera is a 64 y.o. male with a history of ASCAD and dyslipidemia, who  began to complain of tooth pain similar to his prior angina. He underwent nuclear stress test which showed no ischemia. Due to persistence of symptoms he underwent cardiac cath 10/29/2013 showing calcified LM with no stenosis, 90% proximal to mid LAD, moderate disease of the mid left circ with a focal 50% lesion in the OM and mid left circ and patent RCA with mild diffuse atherosclerosis in the RCA. The stents in the mid to distal RCA were widely patent. He underwent DES to the LAD. He did well after PCI but  presented back earlier this month for evaluation of chest pain similar to prior angina.  He underwent cardiac cath showing moderately diseased left main, moderate disease of the ostial LAD, severe disease of the ostial D1, severe disease of the ostial left circ extending back into the left main and ostial LAD and patent RCA stent.  EF was 55%.  He was referred to CVTS and underwent CABG with LIMA to LAD, SVG to OM1 and SVG to D2.  He denies any anginal chest pain or tooth pain.  He has some sternal incision pain.  He has some LE edema.  He denies any palpitations, dizziness or syncope.  He has some DOE.   Past Medical History  Diagnosis Date  . GERD (gastroesophageal reflux disease)   . Hypercholesteremia   . Low back pain   . Coronary atherosclerosis of native coronary artery 2001    a. s/p PCI with  3.0 x 32 Promus DES mLAD. Ca L main w/o sig obst, mod dz in mLCx. Moderate focal lesion in OM1. Mild to mod dz & patent stents in RCA  . Skin cancer of face   . History of blood transfusion 1956  . Dyslipidemia     Past Surgical History    Procedure Laterality Date  . Coronary angioplasty with stent placement  2001; 10/29/2013    PCI of RCA "X 2"; PCI of LAD  . Tonsillectomy and adenoidectomy  ~ 1956  . Knee arthroscopy Right 1980's  . Knee arthroscopy Left 2007; 2008  . Refractive surgery Bilateral ?2007  . Skin cancer excision Left     "cheek"  . Left heart catheterization with coronary angiogram N/A 10/29/2013    Procedure: LEFT HEART CATHETERIZATION WITH CORONARY ANGIOGRAM;  Surgeon: Jettie Booze, MD;  Location: [facility] CATH LAB;  Service: Cardiovascular;  Laterality: N/A;  . Left heart catheterization with coronary angiogram N/A 08/14/2014    Procedure: LEFT HEART CATHETERIZATION WITH CORONARY ANGIOGRAM;  Surgeon: Jettie Booze, MD;  Location: [facility] CATH LAB;  Service: Cardiovascular;  Laterality: N/A;  . Coronary artery bypass graft N/A 08/17/2014    Procedure: CORONARY ARTERY BYPASS GRAFTING (CABG);  Surgeon: Melrose Nakayama, MD;  Location: [place];  Service: Open Heart Surgery;  Laterality: N/A;  Times three using left internal mammary artery and endoscopically harvested right saphenous vein     Current Outpatient Prescriptions  Medication Sig Dispense Refill  . aspirin 81 MG tablet Take 81 mg by mouth every evening.     [name] atorvastatin (LIPITOR) 80  MG tablet Take 1 tablet (80 mg total) by mouth daily. (Patient taking differently: Take 80 mg by mouth every evening. ) 90 tablet 3  . clopidogrel (PLAVIX) 75 MG tablet Take 1 tablet (75 mg total) by mouth daily with breakfast. STOP MARCH 2016 90 tablet 3  . diclofenac (VOLTAREN) 75 MG EC tablet Take 75 mg by mouth daily as needed for moderate pain (back pain).     [name] esomeprazole (NEXIUM) 20 MG capsule Take 20 mg by mouth every morning.     . ezetimibe (ZETIA) 10 MG tablet Take 10 mg by mouth daily.    . metoprolol tartrate (LOPRESSOR) 25 MG tablet Take 1 tablet (25 mg total) by mouth 2 (two) times daily. 60 tablet 1  . Omega-3 Fatty Acids (FISH OIL) 1200 MG CAPS Take  1,200 mg by mouth every morning.    [name] oxyCODONE (OXY IR/ROXICODONE) 5 MG immediate release tablet Take 1-2 tablets (5-10 mg total) by mouth every 4 (four) hours as needed for severe pain. 30 tablet 0  . traMADol (ULTRAM) 50 MG tablet Take 1 tablet (50 mg total) by mouth every 6 (six) hours as needed. (Patient taking differently: Take 50 mg by mouth every 6 (six) hours as needed for moderate pain. ) 40 tablet 0  . vitamin C (ASCORBIC ACID) 500 MG tablet Take 500 mg by mouth every morning.      No current facility-administered medications for this visit.    Allergies:   Review of patient's allergies indicates no known allergies.    Social History:  The patient  reports that he has never smoked. He has never used smokeless tobacco. He reports that he drinks about 1.8 oz of alcohol per week. He reports that he does not use illicit drugs.   Family History:  The patient's family history includes Dementia in his father.    ROS:  Please see the history of present illness.   Otherwise, review of systems are positive for none.   All other systems are reviewed and negative.    PHYSICAL EXAM: VS:  BP 116/68 mmHg  Pulse 96  Ht 5\' 9"  (1.753 m)  Wt 146 lb 6.4 oz (66.407 kg)  BMI 21.61 kg/m2  SpO2 99% , BMI Body mass index is 21.61 kg/(m^2). GEN: Well nourished, well developed, in no acute distress HEENT: normal Neck: no JVD, right carotid bruit, or masses Cardiac: RRR; no murmurs, rubs, or gallops, 2+ RLE edema and 1+ LLE edema Respiratory:  clear to auscultation bilaterally, normal work of breathing GI: soft, nontender, nondistended, + BS MS: no deformity or atrophy Skin: warm and dry, no rash Neuro:  Strength and sensation are intact Psych: euthymic mood, full affect   EKG:  EKG is not ordered today.    Recent Labs: 08/16/2014: ALT 52 08/18/2014: Magnesium 2.0 08/20/2014: Hemoglobin 8.2*; Platelets 112* 08/22/2014: BUN 29*; Creatinine 1.14; Potassium 4.6; Sodium 138    Lipid Panel      Component Value Date/Time   CHOL 147 05/15/2014 0925   TRIG 112.0 05/15/2014 0925   HDL 35.80* 05/15/2014 0925   CHOLHDL 4 05/15/2014 0925   VLDL 22.4 05/15/2014 0925   LDLCALC 89 05/15/2014 0925      Wt Readings from Last 3 Encounters:  08/31/14 146 lb 6.4 oz (66.407 kg)  08/22/14 152 lb 12.5 oz (69.3 kg)  08/11/14 148 lb (67.132 kg)      Other studies Reviewed: Additional studies/ records that were reviewed today include: discharge summary, catheterization note and  CABG op note.  ASSESSMENT AND PLAN:  1. ASCAD with cath 10/29/2013 with patent RCA stents and 90% proximal to mid LAD lesion s/p DES stent.Repeat cath 07/2015 with progression of CAD in LAD/diagonal/LM and patent stent in RCA s/p CABG with LIMA to LAD, SVG to OM and SVG to D2.   - continue ASA/Plavix  - I have told him it is ok to stop Plavix in 10/30/3014 (1 year after stent)      2.  Dyslipidemia - Continue high dose statin and Zetia.  Will check FLP and ALT in 6 weeks 3.  RLE edema secondary to SVG harvest - will start back on Lasix 20mg  daily.  Check BMET in 1 week.  I have asked him to stay on Lasix until he has been seen by Dr. [name] 2/15       4.  Bilateral mild carotid artery stenosis- continue ASA      5.  Anemia secondary to blood loss - check CBC today   Current medicines are reviewed at length with the patient today.  The patient has concerns regarding medicines.  We discussed at length the need to be on high dose statin and Zetia since he is above his LDL goal.  He may need to start a PSCK 9 drug if LDL not < 70 in 6 weeks.  We also discussed the need to remain on BB therapy.  We discussed a 2gm diet daily.    The following changes have been made:  Added lasix for LE edema.  Labs/ tests ordered today include: CBC today and FLP/ALT in 6 weeks and BMET in 1 week  No orders of the defined types were placed in this encounter.     Disposition:   FU with me in 3  months   Signed, Sueanne Margarita, MD  08/31/2014 4:41 PM    [facility] [address], [place], [place]  [address] Phone: [phone]; Fax: [phone]

## 2014-08-31 NOTE — Patient Instructions (Signed)
Your physician has recommended you make the following change in your medication:  1) START Lasix 20 mg daily 2) STOP Plavix on 10/30/2014  Your physician recommends that you return for lab work on Monday, February 1 (BMET, CBC). You can come to lab ANYTIME BETWEEN 7:30 AM and 5:00 PM.  Your physician recommends that you return for FASTING lab work on March 7th (lipids, liver). You can come to lab ANYTIME BETWEEN 7:30 AM and 5:00 PM.  Your physician recommends that you schedule a follow-up appointment on April 26 with Dr. Radford Pax.

## 2014-09-07 ENCOUNTER — Other Ambulatory Visit (INDEPENDENT_AMBULATORY_CARE_PROVIDER_SITE_OTHER): Payer: 59 | Admitting: *Deleted

## 2014-09-07 DIAGNOSIS — R600 Localized edema: Secondary | ICD-10-CM

## 2014-09-07 DIAGNOSIS — D62 Acute posthemorrhagic anemia: Secondary | ICD-10-CM

## 2014-09-07 DIAGNOSIS — R609 Edema, unspecified: Secondary | ICD-10-CM

## 2014-09-07 LAB — CBC WITH DIFFERENTIAL/PLATELET
BASOS PCT: 0.7 % (ref 0.0–3.0)
Basophils Absolute: 0.1 10*3/uL (ref 0.0–0.1)
EOS ABS: 0.2 10*3/uL (ref 0.0–0.7)
Eosinophils Relative: 2.3 % (ref 0.0–5.0)
HCT: 32.5 % — ABNORMAL LOW (ref 39.0–52.0)
Hemoglobin: 10.8 g/dL — ABNORMAL LOW (ref 13.0–17.0)
LYMPHS ABS: 1 10*3/uL (ref 0.7–4.0)
LYMPHS PCT: 14.3 % (ref 12.0–46.0)
MCHC: 33.1 g/dL (ref 30.0–36.0)
MCV: 92.1 fl (ref 78.0–100.0)
Monocytes Absolute: 0.7 10*3/uL (ref 0.1–1.0)
Monocytes Relative: 9.3 % (ref 3.0–12.0)
NEUTROS ABS: 5.2 10*3/uL (ref 1.4–7.7)
NEUTROS PCT: 73.4 % (ref 43.0–77.0)
Platelets: 477 10*3/uL — ABNORMAL HIGH (ref 150.0–400.0)
RBC: 3.53 Mil/uL — AB (ref 4.22–5.81)
RDW: 16.1 % — ABNORMAL HIGH (ref 11.5–15.5)
WBC: 7.1 10*3/uL (ref 4.0–10.5)

## 2014-09-07 LAB — BASIC METABOLIC PANEL
BUN: 22 mg/dL (ref 6–23)
CHLORIDE: 102 meq/L (ref 96–112)
CO2: 28 mEq/L (ref 19–32)
Calcium: 9.5 mg/dL (ref 8.4–10.5)
Creatinine, Ser: 0.91 mg/dL (ref 0.40–1.50)
GFR: 88.95 mL/min (ref >60.00)
Glucose, Bld: 99 mg/dL (ref 70–99)
POTASSIUM: 4.3 meq/L (ref 3.5–5.1)
Sodium: 137 mEq/L (ref 135–145)

## 2014-09-16 ENCOUNTER — Other Ambulatory Visit: Payer: Self-pay | Admitting: Thoracic Surgery (Cardiothoracic Vascular Surgery)

## 2014-09-16 DIAGNOSIS — Z951 Presence of aortocoronary bypass graft: Secondary | ICD-10-CM

## 2014-09-22 ENCOUNTER — Other Ambulatory Visit: Payer: Self-pay | Admitting: *Deleted

## 2014-09-22 ENCOUNTER — Ambulatory Visit
Admission: RE | Admit: 2014-09-22 | Discharge: 2014-09-22 | Disposition: A | Discharge status: Home / Self Care | Payer: 59 | Source: Ambulatory Visit | Attending: Thoracic Surgery (Cardiothoracic Vascular Surgery) | Admitting: Thoracic Surgery (Cardiothoracic Vascular Surgery)

## 2014-09-22 ENCOUNTER — Encounter: Payer: Self-pay | Admitting: Thoracic Surgery (Cardiothoracic Vascular Surgery)

## 2014-09-22 ENCOUNTER — Ambulatory Visit (INDEPENDENT_AMBULATORY_CARE_PROVIDER_SITE_OTHER): Payer: Self-pay | Admitting: Thoracic Surgery (Cardiothoracic Vascular Surgery)

## 2014-09-22 VITALS — BP 119/72 | HR 74 | Resp 16 | Ht 69.0 in | Wt 140.5 lb

## 2014-09-22 DIAGNOSIS — I25118 Atherosclerotic heart disease of native coronary artery with other forms of angina pectoris: Secondary | ICD-10-CM

## 2014-09-22 DIAGNOSIS — G8918 Other acute postprocedural pain: Secondary | ICD-10-CM

## 2014-09-22 DIAGNOSIS — Z951 Presence of aortocoronary bypass graft: Secondary | ICD-10-CM

## 2014-09-22 MED ORDER — TRAMADOL HCL 50 MG PO TABS: 50.0000 mg | ORAL_TABLET | Freq: Four times a day (QID) | ORAL | Status: DC | PRN

## 2014-09-22 MED ORDER — PREDNISONE (PAK) 10 MG PO TABS: ORAL_TABLET | ORAL | Status: DC

## 2014-09-22 NOTE — Progress Notes (Signed)
HPI:  [name] returns today for a scheduled postoperative follow-up visit. He is a 64 year old gentleman who presented with angina. A catheterization he had left main and two-vessel coronary disease. He had a patent stent in his right coronary which was not compromised. He underwent coronary bypass grafting 3 on 08/17/2014. His postoperative course was uncomplicated.  He says that he is walking about 4 miles a day. He is doing this in 2 separate 30 minute sessions where he walks about 2 miles each time. He has not had any recurrent angina. He does have some incisional pain. He is only taking Ultram at night and only to help him go to sleep. He is still sleeping in a recliner and has a little reluctance to lay flat on his back as he feels short of breath when he does so.  He saw Dr. Radford Pax last week. He was having some swelling in his right leg. She started him on Lasix 20 mg daily. The swelling has improved since then.  Past Medical History  Diagnosis Date  . GERD (gastroesophageal reflux disease)   . Hypercholesteremia   . Low back pain   . Coronary atherosclerosis of native coronary artery 2001    a. s/p PCI with  3.0 x 32 Promus DES mLAD. Ca L main w/o sig obst, mod dz in mLCx. Moderate focal lesion in OM1. Mild to mod dz & patent stents in RCA  . Skin cancer of face   . History of blood transfusion 1956  . Dyslipidemia   . Carotid artery occlusion 07/2014    1-39% bilateral plaque  . Edema extremities 08/31/2014  . Anemia associated with acute blood loss 08/31/2014      Current Outpatient Prescriptions  Medication Sig Dispense Refill  . aspirin 81 MG tablet Take 81 mg by mouth every evening.     [name] atorvastatin (LIPITOR) 80 MG tablet Take 1 tablet (80 mg total) by mouth daily. (Patient taking differently: Take 80 mg by mouth every evening. ) 90 tablet 3  . clopidogrel (PLAVIX) 75 MG tablet Take 1 tablet (75 mg total) by mouth daily with breakfast. STOP MARCH 2016 90 tablet 3    . diclofenac (VOLTAREN) 75 MG EC tablet Take 75 mg by mouth daily as needed for moderate pain (back pain).     [name] esomeprazole (NEXIUM) 20 MG capsule Take 20 mg by mouth every morning.     . ezetimibe (ZETIA) 10 MG tablet Take 10 mg by mouth daily.    . furosemide (LASIX) 20 MG tablet Take 1 tablet (20 mg total) by mouth daily. 30 tablet 3  . metoprolol tartrate (LOPRESSOR) 25 MG tablet Take 1 tablet (25 mg total) by mouth 2 (two) times daily. 60 tablet 1  . Omega-3 Fatty Acids (FISH OIL) 1200 MG CAPS Take 1,200 mg by mouth every morning.    . traMADol (ULTRAM) 50 MG tablet Take 1 tablet (50 mg total) by mouth every 6 (six) hours as needed. 40 tablet 0  . vitamin C (ASCORBIC ACID) 500 MG tablet Take 500 mg by mouth every morning.     . predniSONE (STERAPRED UNI-PAK) 10 MG tablet Take 5 tablets PO day 1, 4 tablets PO day 2, 3 tablets PO day 3, 2 tablets PO day 4, 1 tablet PO day 5 15 tablet 0   No current facility-administered medications for this visit.    Physical Exam BP 119/72 mmHg  Pulse 74  Resp 16  Ht 5\' 9"  (1.753  m)  Wt 140 lb 8 oz (63.73 kg)  BMI 20.74 kg/m2  SpO2 24% 64 year old man in no acute distress Alert and oriented 3 with no focal neurologic deficits Cardiac regular rate and rhythm normal S1 and S2 Lungs slightly diminished in both bases, otherwise clear Sternum stable, sternal incision clean dry and intact Leg incision healing well, trace edema  Diagnostic Tests: I reviewed his chest x-ray. His chest x-ray today shows bilateral pleural effusions. The left is slightly larger than the right. These are both smaller than they were when he left the hospital. The radiologist also raised concern for a possible right upper lobe nodule. This is a small peripheral nodule that was not present on his films when he was hospitalized.  Impression: 64 year old man who is now about a month out from coronary bypass grafting 3. Overall he is doing very well as exercise tolerance is  tremendous. He is still having some discomfort which is not surprising this early on. He has expectations of having no limitations or no discomfort. I tried to give him a more realistic idea of what recovery is like. He is through the worst of it at this point and should get better from here.  He was started on Lasix 20 mg daily last week I think he should continue that he still has small bilateral effusions. He says he has 6 or 7 more tablets , he will take those until they're gone and then he'll stop after that and watch his weight and watch for evidence of swelling.   He has small bilateral pleural effusions left greater than right. I do not think there is enough fluid to warrant thoracentesis. He might benefit from a short taper of steroids to cut down any inflammatory component of the fluid. I am going to give him a prednisone taper with 50 mg tapering to 10 mg over 5 days and then stopping.   He'll need another chest x-ray in about a month to evaluate the right upper lobe opacity and to follow-up the pleural effusions.   He may begin driving. Appropriate precautions were discussed. He knows not to drive and is taking tramadol during the day. If he takes tramadol at night he can drive the following day.  Plan: Finish prescribed course of Lasix  Prednisone taper  Return in 4 weeks with PA and lateral chest x-ray.  Revonda Standard [name], MD [facility] [phone]

## 2014-09-28 ENCOUNTER — Telehealth: Payer: Self-pay | Admitting: Cardiology

## 2014-09-28 NOTE — Telephone Encounter (Signed)
New Message      Patient is calling because he had heart surgery 6 weeks ago and he needs the Dr. To ok for him to start cardiac rehab @ [facility].    Please give patient a call. [phone] is the number  Thanks

## 2014-09-29 ENCOUNTER — Ambulatory Visit: Payer: 59 | Admitting: Thoracic Surgery (Cardiothoracic Vascular Surgery)

## 2014-09-29 NOTE — Telephone Encounter (Signed)
Cardiac Rehab sheet completed and placed in Medical Records "to be signed" folder.

## 2014-10-08 ENCOUNTER — Encounter (HOSPITAL_COMMUNITY)
Admission: RE | Admit: 2014-10-08 | Discharge: 2014-10-08 | Disposition: A | Discharge status: Home / Self Care | Payer: 59 | Source: Ambulatory Visit | Attending: Cardiology | Admitting: Cardiology

## 2014-10-08 DIAGNOSIS — I2584 Coronary atherosclerosis due to calcified coronary lesion: Secondary | ICD-10-CM | POA: Insufficient documentation

## 2014-10-08 DIAGNOSIS — E780 Pure hypercholesterolemia: Secondary | ICD-10-CM | POA: Insufficient documentation

## 2014-10-08 DIAGNOSIS — Z7982 Long term (current) use of aspirin: Secondary | ICD-10-CM | POA: Insufficient documentation

## 2014-10-08 DIAGNOSIS — Z7902 Long term (current) use of antithrombotics/antiplatelets: Secondary | ICD-10-CM | POA: Insufficient documentation

## 2014-10-08 DIAGNOSIS — I25118 Atherosclerotic heart disease of native coronary artery with other forms of angina pectoris: Secondary | ICD-10-CM | POA: Insufficient documentation

## 2014-10-08 DIAGNOSIS — Z951 Presence of aortocoronary bypass graft: Secondary | ICD-10-CM | POA: Insufficient documentation

## 2014-10-08 DIAGNOSIS — Z955 Presence of coronary angioplasty implant and graft: Secondary | ICD-10-CM | POA: Insufficient documentation

## 2014-10-08 DIAGNOSIS — F419 Anxiety disorder, unspecified: Secondary | ICD-10-CM | POA: Insufficient documentation

## 2014-10-08 DIAGNOSIS — Z5189 Encounter for other specified aftercare: Secondary | ICD-10-CM | POA: Insufficient documentation

## 2014-10-08 NOTE — Progress Notes (Signed)
Cardiac Rehab Medication Review by a Pharmacist  Does the patient  feel that his/her medications are working for him/her?  yes  Has the patient been experiencing any side effects to the medications prescribed?  no  Does the patient measure his/her own blood pressure or blood glucose at home?  no   Does the patient have any problems obtaining medications due to transportation or finances?   no  Understanding of regimen: excellent Understanding of indications: excellent Potential of compliance: excellent    Pharmacist comments: Michael Herrera is a pleasant [name] presenting to cardiac rehab.  He appears very compliant with his medications and a highly motivated individual.  He states he walks about 4 miles a day, split into two sessions in the morning and evening.  He reports he was sluggish the first few weeks on metoprolol but this has resolved.  He does not check his BP at home but I encouraged him to check it periodically to ensure he maintains within goal.  He states he also takes very good care of his reduced sodium diet.    [name], Pharm. D. Clinical Pharmacy Resident Pager: [phone] Ph: [phone] 10/08/2014 8:29 AM

## 2014-10-12 ENCOUNTER — Other Ambulatory Visit (INDEPENDENT_AMBULATORY_CARE_PROVIDER_SITE_OTHER): Payer: 59 | Admitting: *Deleted

## 2014-10-12 ENCOUNTER — Encounter (HOSPITAL_COMMUNITY): Payer: Self-pay

## 2014-10-12 ENCOUNTER — Encounter (HOSPITAL_COMMUNITY)
Admission: RE | Admit: 2014-10-12 | Discharge: 2014-10-12 | Disposition: A | Discharge status: Home / Self Care | Payer: 59 | Source: Ambulatory Visit | Attending: Cardiology | Admitting: Cardiology

## 2014-10-12 DIAGNOSIS — E780 Pure hypercholesterolemia: Secondary | ICD-10-CM | POA: Diagnosis not present

## 2014-10-12 DIAGNOSIS — Z7902 Long term (current) use of antithrombotics/antiplatelets: Secondary | ICD-10-CM | POA: Diagnosis not present

## 2014-10-12 DIAGNOSIS — I25118 Atherosclerotic heart disease of native coronary artery with other forms of angina pectoris: Secondary | ICD-10-CM | POA: Diagnosis not present

## 2014-10-12 DIAGNOSIS — I2584 Coronary atherosclerosis due to calcified coronary lesion: Secondary | ICD-10-CM | POA: Diagnosis not present

## 2014-10-12 DIAGNOSIS — Z955 Presence of coronary angioplasty implant and graft: Secondary | ICD-10-CM | POA: Diagnosis not present

## 2014-10-12 DIAGNOSIS — Z7982 Long term (current) use of aspirin: Secondary | ICD-10-CM | POA: Diagnosis not present

## 2014-10-12 DIAGNOSIS — Z951 Presence of aortocoronary bypass graft: Secondary | ICD-10-CM | POA: Diagnosis not present

## 2014-10-12 DIAGNOSIS — F419 Anxiety disorder, unspecified: Secondary | ICD-10-CM | POA: Diagnosis not present

## 2014-10-12 DIAGNOSIS — E785 Hyperlipidemia, unspecified: Secondary | ICD-10-CM

## 2014-10-12 DIAGNOSIS — Z5189 Encounter for other specified aftercare: Secondary | ICD-10-CM | POA: Diagnosis not present

## 2014-10-12 LAB — LIPID PANEL
CHOL/HDL RATIO: 3
Cholesterol: 83 mg/dL (ref 0–200)
HDL: 30.5 mg/dL — ABNORMAL LOW (ref >39.00)
LDL Cholesterol: 38 mg/dL (ref 0–99)
NonHDL: 52.5
TRIGLYCERIDES: 72 mg/dL (ref 0.0–149.0)
VLDL: 14.4 mg/dL (ref 0.0–40.0)

## 2014-10-12 LAB — HEPATIC FUNCTION PANEL
ALK PHOS: 96 U/L (ref 39–117)
ALT: 28 U/L (ref 0–53)
AST: 21 U/L (ref 0–37)
Albumin: 4.3 g/dL (ref 3.5–5.2)
BILIRUBIN DIRECT: 0.1 mg/dL (ref 0.0–0.3)
Total Bilirubin: 0.4 mg/dL (ref 0.2–1.2)
Total Protein: 6.7 g/dL (ref 6.0–8.3)

## 2014-10-12 NOTE — Progress Notes (Signed)
Pt started cardiac rehab today.  Pt tolerated light exercise without difficulty.  VSS, telemetry-NSR.  Asymptomatic.  PHQ-0.  Pt reports he did have depression symptoms which have resolved now that he is able to drive and walk for exercise. Pt offered reassurance and emotional support.  Pt enjoys playing cornhole, in fact plays competitively in tournaments,  and fishing.  Pt cardiac rehab goals are to improve his dyspnea and be able to sleep in the bed rather than recliner.  Pt reports he sleeps in the recliner due to sternal healing discomfort, not due to dyspnea.  Pt is also very eager to resume to prior activities.  Pt oriented to exercise equipment and routine.  Understanding verbalized.

## 2014-10-13 ENCOUNTER — Telehealth: Payer: Self-pay | Admitting: *Deleted

## 2014-10-13 MED ORDER — METOPROLOL TARTRATE 25 MG PO TABS: 25.0000 mg | ORAL_TABLET | Freq: Two times a day (BID) | ORAL | Status: DC

## 2014-10-13 NOTE — Telephone Encounter (Signed)
-----   Message from Sueanne Margarita, MD sent at 10/12/2014  3:53 PM EST ----- Stable labs - continue current meds

## 2014-10-13 NOTE — Telephone Encounter (Signed)
Patient informed of results and verbal understanding expressed.    Patient requests 90 day supply of metoprolol. Refill sent.

## 2014-10-14 ENCOUNTER — Encounter (HOSPITAL_COMMUNITY)
Admission: RE | Admit: 2014-10-14 | Discharge: 2014-10-14 | Disposition: A | Discharge status: Home / Self Care | Payer: 59 | Source: Ambulatory Visit | Attending: Cardiology | Admitting: Cardiology

## 2014-10-14 DIAGNOSIS — Z5189 Encounter for other specified aftercare: Secondary | ICD-10-CM | POA: Diagnosis not present

## 2014-10-14 NOTE — Progress Notes (Signed)
Reviewed home exercise with pt today.  Pt plans to walk at home and to go [employer] for exercise.  Reviewed THR, pulse, RPE, sign and symptoms, and when to call 911 or MD.  Pt voiced understanding. Alberteen Sam, MA, ACSM RCEP

## 2014-10-15 ENCOUNTER — Other Ambulatory Visit: Payer: Self-pay | Admitting: Thoracic Surgery (Cardiothoracic Vascular Surgery)

## 2014-10-15 DIAGNOSIS — Z951 Presence of aortocoronary bypass graft: Secondary | ICD-10-CM

## 2014-10-16 ENCOUNTER — Encounter (HOSPITAL_COMMUNITY)
Admission: RE | Admit: 2014-10-16 | Discharge: 2014-10-16 | Disposition: A | Discharge status: Home / Self Care | Payer: 59 | Source: Ambulatory Visit | Attending: Cardiology | Admitting: Cardiology

## 2014-10-16 DIAGNOSIS — Z5189 Encounter for other specified aftercare: Secondary | ICD-10-CM | POA: Diagnosis not present

## 2014-10-19 ENCOUNTER — Encounter (HOSPITAL_COMMUNITY)
Admission: RE | Admit: 2014-10-19 | Discharge: 2014-10-19 | Disposition: A | Discharge status: Home / Self Care | Payer: 59 | Source: Ambulatory Visit | Attending: Cardiology | Admitting: Cardiology

## 2014-10-19 DIAGNOSIS — Z5189 Encounter for other specified aftercare: Secondary | ICD-10-CM | POA: Diagnosis not present

## 2014-10-20 ENCOUNTER — Ambulatory Visit
Admission: RE | Admit: 2014-10-20 | Discharge: 2014-10-20 | Disposition: A | Discharge status: Home / Self Care | Payer: 59 | Source: Ambulatory Visit | Attending: Thoracic Surgery (Cardiothoracic Vascular Surgery) | Admitting: Thoracic Surgery (Cardiothoracic Vascular Surgery)

## 2014-10-20 ENCOUNTER — Ambulatory Visit (INDEPENDENT_AMBULATORY_CARE_PROVIDER_SITE_OTHER): Payer: Self-pay | Admitting: Thoracic Surgery (Cardiothoracic Vascular Surgery)

## 2014-10-20 ENCOUNTER — Encounter: Payer: Self-pay | Admitting: Thoracic Surgery (Cardiothoracic Vascular Surgery)

## 2014-10-20 VITALS — BP 120/75 | HR 79 | Resp 20 | Ht 69.0 in | Wt 138.0 lb

## 2014-10-20 DIAGNOSIS — I25118 Atherosclerotic heart disease of native coronary artery with other forms of angina pectoris: Secondary | ICD-10-CM

## 2014-10-20 DIAGNOSIS — J90 Pleural effusion, not elsewhere classified: Secondary | ICD-10-CM

## 2014-10-20 DIAGNOSIS — R911 Solitary pulmonary nodule: Secondary | ICD-10-CM

## 2014-10-20 DIAGNOSIS — Z951 Presence of aortocoronary bypass graft: Secondary | ICD-10-CM

## 2014-10-20 MED ORDER — POTASSIUM CHLORIDE CRYS ER 10 MEQ PO TBCR: 10.0000 meq | EXTENDED_RELEASE_TABLET | Freq: Every day | ORAL | Status: DC

## 2014-10-20 MED ORDER — FUROSEMIDE 40 MG PO TABS: 40.0000 mg | ORAL_TABLET | Freq: Every day | ORAL | Status: DC

## 2014-10-20 NOTE — Progress Notes (Signed)
HPI:  Michael Herrera is a 64 year old gentleman who had coronary bypass grafting 3 on 08/17/2014. He was last seen in the office on February 16. At that time he was having difficulty lying flat due to orthopnea and shortness of breath. He had been started on Lasix 20 mg daily. His chest x-ray showed bilateral effusions left greater than right. I gave him a steroid taper as well.  He says that he noticed some improvement initially but he still cannot lay flat on his back. He is sleeping in a recliner and says he has difficulty getting to sleep in that position. He has started cardiac rehabilitation. He still gets short of breath more quickly than he would like to. His swelling in his left leg is resolved he still has some mild swelling in his right leg.  Past Medical History  Diagnosis Date  . GERD (gastroesophageal reflux disease)   . Hypercholesteremia   . Low back pain   . Coronary atherosclerosis of native coronary artery 2001    a. s/p PCI with  3.0 x 32 Promus DES mLAD. Ca L main w/o sig obst, mod dz in mLCx. Moderate focal lesion in OM1. Mild to mod dz & patent stents in RCA  . Skin cancer of face   . History of blood transfusion 1956  . Dyslipidemia   . Carotid artery occlusion 07/2014    1-39% bilateral plaque  . Edema extremities 08/31/2014  . Anemia associated with acute blood loss 08/31/2014      Current Outpatient Prescriptions  Medication Sig Dispense Refill  . acetaminophen (TYLENOL) 500 MG tablet Take 500 mg by mouth every 6 (six) hours as needed for mild pain or moderate pain.    [name] aspirin 81 MG tablet Take 81 mg by mouth every evening.     [name] atorvastatin (LIPITOR) 80 MG tablet Take 1 tablet (80 mg total) by mouth daily. (Patient taking differently: Take 80 mg by mouth every evening. ) 90 tablet 3  . clopidogrel (PLAVIX) 75 MG tablet Take 1 tablet (75 mg total) by mouth daily with breakfast. STOP MARCH 2016 90 tablet 3  . esomeprazole (NEXIUM) 20 MG capsule Take 20 mg by  mouth daily as needed.     . ezetimibe (ZETIA) 10 MG tablet Take 10 mg by mouth daily.    . metoprolol tartrate (LOPRESSOR) 25 MG tablet Take 1 tablet (25 mg total) by mouth 2 (two) times daily. 180 tablet 2  . Omega-3 Fatty Acids (FISH OIL) 1200 MG CAPS Take 1,200 mg by mouth every morning.    . vitamin C (ASCORBIC ACID) 500 MG tablet Take 500 mg by mouth every morning.     . vitamin E 400 UNIT capsule Take 400 Units by mouth daily.    . furosemide (LASIX) 40 MG tablet Take 1 tablet (40 mg total) by mouth daily. 30 tablet 0  . potassium chloride (K-DUR,KLOR-CON) 10 MEQ tablet Take 1 tablet (10 mEq total) by mouth daily. 30 tablet 0   No current facility-administered medications for this visit.    Physical Exam BP 120/75 mmHg  Pulse 79  Resp 20  Ht 5\' 9"  (1.753 m)  Wt 138 lb (62.596 kg)  BMI 20.37 kg/m2  SpO55 84% 64 year old man in no acute distress Well-developed well-nourished Alert and oriented 3 with no focal deficits Lungs with diminished breath sounds at the bases Cardiac regular rate and rhythm no right of Incisions healing well, sternum stable Trace edema right lower extremity  Diagnostic Tests:  I reviewed his chest x-ray. It shows small bilateral effusions, left side is improved compared to his previous film.  Impression: Michael Herrera is a 64 year old gentleman who underwent coronary bypass grafting about 2 months ago. He has bilateral pleural effusions. The effusion on the left is improved compared to his film from February, but he remains symptomatic. Neither the right nor the left is large enough to warrant thoracentesis.  I tried to reassure him that is very common to have some fluid around the lungs after surgery. There may be an inflammatory component. We did give him a trial steroids earlier. At this point, I think the best option would be to put him back on Lasix 40 mg daily for a month. I also put him on potassium 10 mEq daily for a month. I'll plan to see him back  in 4 weeks with a repeat chest x-ray.  The radiologist also noted a possible right upper lobe lung nodule his chest x-ray. The radiologist was not sure that there was a nodule present on today's x-ray. To me looks about the same as it had previously. He suggested this may be summation shadows. We'll reassess that on his next x-ray as well.  Plan: Return in 4 weeks with repeat PA and lateral chest x-ray  Melrose Nakayama, MD [facility] [phone]

## 2014-10-21 ENCOUNTER — Encounter (HOSPITAL_COMMUNITY)
Admission: RE | Admit: 2014-10-21 | Discharge: 2014-10-21 | Disposition: A | Discharge status: Home / Self Care | Payer: 59 | Source: Ambulatory Visit | Attending: Cardiology | Admitting: Cardiology

## 2014-10-21 DIAGNOSIS — Z5189 Encounter for other specified aftercare: Secondary | ICD-10-CM | POA: Diagnosis not present

## 2014-10-23 ENCOUNTER — Encounter (HOSPITAL_COMMUNITY)
Admission: RE | Admit: 2014-10-23 | Discharge: 2014-10-23 | Disposition: A | Discharge status: Home / Self Care | Payer: 59 | Source: Ambulatory Visit | Attending: Cardiology | Admitting: Cardiology

## 2014-10-23 DIAGNOSIS — Z5189 Encounter for other specified aftercare: Secondary | ICD-10-CM | POA: Diagnosis not present

## 2014-10-26 ENCOUNTER — Encounter (HOSPITAL_COMMUNITY)
Admission: RE | Admit: 2014-10-26 | Discharge: 2014-10-26 | Disposition: A | Discharge status: Home / Self Care | Payer: 59 | Source: Ambulatory Visit | Attending: Cardiology | Admitting: Cardiology

## 2014-10-26 ENCOUNTER — Encounter (HOSPITAL_COMMUNITY): Payer: 59

## 2014-10-26 DIAGNOSIS — Z5189 Encounter for other specified aftercare: Secondary | ICD-10-CM | POA: Diagnosis not present

## 2014-10-28 ENCOUNTER — Encounter (HOSPITAL_COMMUNITY): Payer: 59

## 2014-10-28 ENCOUNTER — Encounter (HOSPITAL_COMMUNITY)
Admission: RE | Admit: 2014-10-28 | Discharge: 2014-10-28 | Disposition: A | Discharge status: Home / Self Care | Payer: 59 | Source: Ambulatory Visit | Attending: Cardiology | Admitting: Cardiology

## 2014-10-28 DIAGNOSIS — Z5189 Encounter for other specified aftercare: Secondary | ICD-10-CM | POA: Diagnosis not present

## 2014-10-30 ENCOUNTER — Encounter (HOSPITAL_COMMUNITY)
Admission: RE | Admit: 2014-10-30 | Discharge: 2014-10-30 | Disposition: A | Discharge status: Home / Self Care | Payer: 59 | Source: Ambulatory Visit | Attending: Cardiology | Admitting: Cardiology

## 2014-10-30 ENCOUNTER — Encounter (HOSPITAL_COMMUNITY): Payer: 59

## 2014-10-30 DIAGNOSIS — Z5189 Encounter for other specified aftercare: Secondary | ICD-10-CM | POA: Diagnosis not present

## 2014-11-02 ENCOUNTER — Encounter (HOSPITAL_COMMUNITY): Payer: 59

## 2014-11-02 ENCOUNTER — Telehealth (HOSPITAL_COMMUNITY): Payer: Self-pay | Admitting: Cardiac Rehabilitation

## 2014-11-02 NOTE — Telephone Encounter (Signed)
Phone message received from pt unable to attend cardiac rehab today due to personal reasons.

## 2014-11-04 ENCOUNTER — Encounter (HOSPITAL_COMMUNITY): Payer: 59

## 2014-11-04 ENCOUNTER — Encounter (HOSPITAL_COMMUNITY)
Admission: RE | Admit: 2014-11-04 | Discharge: 2014-11-04 | Disposition: A | Discharge status: Home / Self Care | Payer: 59 | Source: Ambulatory Visit | Attending: Cardiology | Admitting: Cardiology

## 2014-11-04 DIAGNOSIS — Z5189 Encounter for other specified aftercare: Secondary | ICD-10-CM | POA: Diagnosis not present

## 2014-11-06 ENCOUNTER — Encounter (HOSPITAL_COMMUNITY)
Admission: RE | Admit: 2014-11-06 | Discharge: 2014-11-06 | Disposition: A | Discharge status: Home / Self Care | Payer: 59 | Source: Ambulatory Visit | Attending: Cardiology | Admitting: Cardiology

## 2014-11-06 ENCOUNTER — Encounter (HOSPITAL_COMMUNITY): Payer: 59

## 2014-11-06 DIAGNOSIS — Z7902 Long term (current) use of antithrombotics/antiplatelets: Secondary | ICD-10-CM | POA: Diagnosis not present

## 2014-11-06 DIAGNOSIS — Z951 Presence of aortocoronary bypass graft: Secondary | ICD-10-CM | POA: Diagnosis not present

## 2014-11-06 DIAGNOSIS — Z5189 Encounter for other specified aftercare: Secondary | ICD-10-CM | POA: Insufficient documentation

## 2014-11-06 DIAGNOSIS — E780 Pure hypercholesterolemia: Secondary | ICD-10-CM | POA: Insufficient documentation

## 2014-11-06 DIAGNOSIS — Z955 Presence of coronary angioplasty implant and graft: Secondary | ICD-10-CM | POA: Insufficient documentation

## 2014-11-06 DIAGNOSIS — I25118 Atherosclerotic heart disease of native coronary artery with other forms of angina pectoris: Secondary | ICD-10-CM | POA: Diagnosis not present

## 2014-11-06 DIAGNOSIS — Z7982 Long term (current) use of aspirin: Secondary | ICD-10-CM | POA: Insufficient documentation

## 2014-11-06 DIAGNOSIS — I2584 Coronary atherosclerosis due to calcified coronary lesion: Secondary | ICD-10-CM | POA: Diagnosis not present

## 2014-11-06 DIAGNOSIS — F419 Anxiety disorder, unspecified: Secondary | ICD-10-CM | POA: Diagnosis not present

## 2014-11-09 ENCOUNTER — Encounter (HOSPITAL_COMMUNITY): Payer: 59

## 2014-11-09 ENCOUNTER — Encounter (HOSPITAL_COMMUNITY)
Admission: RE | Admit: 2014-11-09 | Discharge: 2014-11-09 | Disposition: A | Discharge status: Home / Self Care | Payer: 59 | Source: Ambulatory Visit | Attending: Cardiology | Admitting: Cardiology

## 2014-11-09 DIAGNOSIS — Z5189 Encounter for other specified aftercare: Secondary | ICD-10-CM | POA: Diagnosis not present

## 2014-11-11 ENCOUNTER — Encounter (HOSPITAL_COMMUNITY)
Admission: RE | Admit: 2014-11-11 | Discharge: 2014-11-11 | Disposition: A | Discharge status: Home / Self Care | Payer: 59 | Source: Ambulatory Visit | Attending: Cardiology | Admitting: Cardiology

## 2014-11-11 ENCOUNTER — Encounter (HOSPITAL_COMMUNITY): Payer: 59

## 2014-11-11 DIAGNOSIS — Z5189 Encounter for other specified aftercare: Secondary | ICD-10-CM | POA: Diagnosis not present

## 2014-11-11 NOTE — Progress Notes (Signed)
Michael Herrera 64 y.o. male Nutrition Note Spoke with pt. Nutrition Survey reviewed with pt. Pt is following Step 2 of the Therapeutic Lifestyle Changes diet. Pt reports he has lost "about 14 lb since my surgery." Pt states his wt was 60.4 kg today, which is down 7.8 kg (17 lb) over the past month. Wt loss not desired. Wt gain tips reviewed. Pt expressed understanding of the information reviewed. Pt aware of nutrition education classes offered. Vitals - 1 value per visit 10/20/2014 10/08/2014 09/22/2014 08/31/2014 08/22/2014  Weight (lb) 138 136.47 140.5 146.4 152.78   Vitals - 1 value per visit 08/21/2014 08/17/2014 08/14/2014 08/11/2014 05/15/2014  Weight (lb)    148 148   Vitals - 1 value per visit 11/13/2013 10/30/2013 10/29/2013 10/22/2013  Weight (lb) 146  151.46 150   Nutrition Diagnosis Food-and nutrition-related knowledge deficit related to lack of exposure to information as related to diagnosis of: ? CVD   Nutrition Intervention ? Benefits of adopting Therapeutic Lifestyle Changes discussed when Medficts reviewed. ? Pt to attend the Portion Distortion class ? Pt given handouts for: ? Nutrition I class ? Nutrition II class ? Continue client-centered nutrition education by RD, as part of interdisciplinary care.  Goal(s) ? Pt to identify food quantities necessary to achieve: ? wt gain ? Pt to describe the benefit of including fruits, vegetables, whole grains, and low-fat dairy products in a heart healthy meal plan.  Monitor and Evaluate progress toward nutrition goal with team.  Derek Mound, M.Ed, RD, LDN, CDE 11/11/2014 9:26 AM

## 2014-11-13 ENCOUNTER — Encounter (HOSPITAL_COMMUNITY)
Admission: RE | Admit: 2014-11-13 | Discharge: 2014-11-13 | Disposition: A | Discharge status: Home / Self Care | Payer: 59 | Source: Ambulatory Visit | Attending: Cardiology | Admitting: Cardiology

## 2014-11-13 ENCOUNTER — Encounter (HOSPITAL_COMMUNITY): Payer: 59

## 2014-11-13 DIAGNOSIS — Z5189 Encounter for other specified aftercare: Secondary | ICD-10-CM | POA: Diagnosis not present

## 2014-11-16 ENCOUNTER — Other Ambulatory Visit: Payer: Self-pay | Admitting: Thoracic Surgery (Cardiothoracic Vascular Surgery)

## 2014-11-16 ENCOUNTER — Encounter (HOSPITAL_COMMUNITY)
Admission: RE | Admit: 2014-11-16 | Discharge: 2014-11-16 | Disposition: A | Discharge status: Home / Self Care | Payer: 59 | Source: Ambulatory Visit | Attending: Cardiology | Admitting: Cardiology

## 2014-11-16 ENCOUNTER — Encounter (HOSPITAL_COMMUNITY): Payer: 59

## 2014-11-16 ENCOUNTER — Telehealth: Payer: Self-pay | Admitting: Cardiology

## 2014-11-16 DIAGNOSIS — I25119 Atherosclerotic heart disease of native coronary artery with unspecified angina pectoris: Secondary | ICD-10-CM

## 2014-11-16 DIAGNOSIS — Z5189 Encounter for other specified aftercare: Secondary | ICD-10-CM | POA: Diagnosis not present

## 2014-11-16 NOTE — Telephone Encounter (Signed)
New message         Pt is not happy with provider schedule change and would like to be seen sooner than June 2   Is there a date sooner that Dr Radford Pax can be overbooked?

## 2014-11-16 NOTE — Telephone Encounter (Signed)
Follow up       Pt called back and is satisfied with June 2 date

## 2014-11-17 ENCOUNTER — Encounter: Payer: Self-pay | Admitting: Thoracic Surgery (Cardiothoracic Vascular Surgery)

## 2014-11-17 ENCOUNTER — Ambulatory Visit
Admission: RE | Admit: 2014-11-17 | Discharge: 2014-11-17 | Disposition: A | Discharge status: Home / Self Care | Payer: 59 | Source: Ambulatory Visit | Attending: Thoracic Surgery (Cardiothoracic Vascular Surgery) | Admitting: Thoracic Surgery (Cardiothoracic Vascular Surgery)

## 2014-11-17 ENCOUNTER — Ambulatory Visit (INDEPENDENT_AMBULATORY_CARE_PROVIDER_SITE_OTHER): Payer: 59 | Admitting: Thoracic Surgery (Cardiothoracic Vascular Surgery)

## 2014-11-17 VITALS — BP 107/72 | HR 77 | Resp 20 | Ht 69.0 in | Wt 136.0 lb

## 2014-11-17 DIAGNOSIS — J90 Pleural effusion, not elsewhere classified: Secondary | ICD-10-CM

## 2014-11-17 DIAGNOSIS — Z951 Presence of aortocoronary bypass graft: Secondary | ICD-10-CM | POA: Diagnosis not present

## 2014-11-17 DIAGNOSIS — R911 Solitary pulmonary nodule: Secondary | ICD-10-CM

## 2014-11-17 DIAGNOSIS — I25119 Atherosclerotic heart disease of native coronary artery with unspecified angina pectoris: Secondary | ICD-10-CM

## 2014-11-17 NOTE — Progress Notes (Signed)
[address]       [place] [address]             [phone]       HPI:  Michael Herrera returns today for a scheduled follow-up visit.  He is a 64 year old man who underwent coronary bypass grafting 3 on January 16. He was last in the office on March 15 of which time he was complaining of some shortness of breath and his chest x-ray showed small bilateral effusions. We have previously treated him with a steroid taper, so that visit we elected to put him back on Lasix 40 mg daily for a month.  He says that he still feels a little short of breath. His symptoms have improved. Does not have any peripheral edema. He now is able to lay flat in his bed to sleep. He's not had any anginal-type chest pain. He is walking 6-7 miles a day and doing cardiac rehabilitation 3 times a week.  Past Medical History  Diagnosis Date  . GERD (gastroesophageal reflux disease)   . Hypercholesteremia   . Low back pain   . Coronary atherosclerosis of native coronary artery 2001    a. s/p PCI with  3.0 x 32 Promus DES mLAD. Ca L main w/o sig obst, mod dz in mLCx. Moderate focal lesion in OM1. Mild to mod dz & patent stents in RCA  . Skin cancer of face   . History of blood transfusion 1956  . Dyslipidemia   . Carotid artery occlusion 07/2014    1-39% bilateral plaque  . Edema extremities 08/31/2014  . Anemia associated with acute blood loss 08/31/2014      Current Outpatient Prescriptions  Medication Sig Dispense Refill  . acetaminophen (TYLENOL) 500 MG tablet Take 500 mg by mouth every 6 (six) hours as needed for mild pain or moderate pain.    [name] aspirin 81 MG tablet Take 81 mg by mouth every evening.     [name] atorvastatin (LIPITOR) 80 MG tablet Take 1 tablet (80 mg total) by mouth daily. (Patient taking differently: Take 80 mg by mouth every evening. ) 90 tablet 3  . esomeprazole (NEXIUM) 20 MG capsule Take 20 mg by mouth daily as needed.     . ezetimibe (ZETIA) 10 MG tablet Take 10 mg  by mouth daily.    . furosemide (LASIX) 40 MG tablet Take 1 tablet (40 mg total) by mouth daily. 30 tablet 0  . metoprolol tartrate (LOPRESSOR) 25 MG tablet Take 1 tablet (25 mg total) by mouth 2 (two) times daily. (Patient taking differently: Take 12.5 mg by mouth 2 (two) times daily. ) 180 tablet 2  . Omega-3 Fatty Acids (FISH OIL) 1200 MG CAPS Take 1,200 mg by mouth every morning.    . potassium chloride (K-DUR,KLOR-CON) 10 MEQ tablet Take 1 tablet (10 mEq total) by mouth daily. 30 tablet 0  . vitamin C (ASCORBIC ACID) 500 MG tablet Take 500 mg by mouth every morning.     . vitamin E 400 UNIT capsule Take 400 Units by mouth daily.     No current facility-administered medications for this visit.    Physical Exam: BP 107/72 mmHg  Pulse 77  Resp 20  Ht 5\' 9"  (1.753 m)  Wt 136 lb (61.689 kg)  BMI 20.07 kg/m2  SpO40 30% 64 year old man in no acute distress Alert and oriented 3 with no focal deficits Cardiac regular rate and rhythm normal S1 and S2 no  rub Lungs clear with equal breath sounds bilaterally No peripheral edema  Diagnostic Tests: Chest x-ray report is decreased size of small bilateral effusions. No mention of right upper lobe opacity. The PACS system is down and I am unable to see the films myself.  Impression: 64 year old gentleman who is now about 3 months out from coronary bypass grafting. At this point he is doing fine. Some of his symptomatology is likely due to altered chest wall mechanics from surgery. I do think there is an anxiety component as well. He has no evidence of volume overload at this point in time so I am going to stop his Lasix. He will also stop the potassium. I advised him to weigh himself every day at about the same time. If he notices an increase of 3 pounds a day or 5 pounds in 3 days he should contact either myself or Dr. Radford Pax.  He has to about stopping metoprolol. He is concerned because his blood pressure runs relatively low. I recommended that  he decrease that dosage to one half a tablet (12.5 mg) twice daily. He sees Dr. Radford Pax in June and he can discuss with her whether or not to stop that altogether.  Plan: He will follow-up with Dr. Radford Pax at his appointment in June  I will be happy to see him back any time if I can be of any further assistance with his care.  Melrose Nakayama, MD [facility] [phone]

## 2014-11-18 ENCOUNTER — Encounter (HOSPITAL_COMMUNITY): Payer: 59

## 2014-11-18 ENCOUNTER — Encounter (HOSPITAL_COMMUNITY)
Admission: RE | Admit: 2014-11-18 | Discharge: 2014-11-18 | Disposition: A | Discharge status: Home / Self Care | Payer: 59 | Source: Ambulatory Visit | Attending: Cardiology | Admitting: Cardiology

## 2014-11-18 DIAGNOSIS — Z5189 Encounter for other specified aftercare: Secondary | ICD-10-CM | POA: Diagnosis not present

## 2014-11-20 ENCOUNTER — Encounter (HOSPITAL_COMMUNITY): Payer: Self-pay

## 2014-11-20 ENCOUNTER — Encounter (HOSPITAL_COMMUNITY)
Admission: RE | Admit: 2014-11-20 | Discharge: 2014-11-20 | Disposition: A | Discharge status: Home / Self Care | Payer: 59 | Source: Ambulatory Visit | Attending: Cardiology | Admitting: Cardiology

## 2014-11-20 ENCOUNTER — Encounter (HOSPITAL_COMMUNITY): Payer: 59

## 2014-11-20 DIAGNOSIS — Z5189 Encounter for other specified aftercare: Secondary | ICD-10-CM | POA: Diagnosis not present

## 2014-11-20 NOTE — Progress Notes (Signed)
Pt graduated early  from cardiac rehab program today with completion of 17 exercise sessions in Phase II. Pt maintained good attendance and progressed nicely during his participation in rehab as evidenced by increased MET level.   Medication list reconciled. Repeat  PHQ score-0.  Pt reports initial post op depressive symptoms have resolved.   Pt has made significant lifestyle changes and should be commended for his success. Pt feels he has achieved his goals during cardiac rehab which include increased strength/stamina, less dyspnea and overall feeling of well being. Pt also reports improved stress management techniques.     Pt plans to continue exercising on his own walking 6 miles daily and joining a fitness center.  Pt cautioned to stay within target heart rate parameters when exercising at home.  Understanding verbalized

## 2014-11-23 ENCOUNTER — Encounter (HOSPITAL_COMMUNITY): Payer: 59

## 2014-11-25 ENCOUNTER — Encounter (HOSPITAL_COMMUNITY): Payer: 59

## 2014-11-27 ENCOUNTER — Encounter (HOSPITAL_COMMUNITY): Payer: 59

## 2014-11-30 ENCOUNTER — Encounter (HOSPITAL_COMMUNITY): Payer: 59

## 2014-12-01 ENCOUNTER — Ambulatory Visit: Payer: 59 | Admitting: Cardiology

## 2014-12-02 ENCOUNTER — Encounter (HOSPITAL_COMMUNITY): Payer: 59

## 2014-12-04 ENCOUNTER — Encounter (HOSPITAL_COMMUNITY): Payer: 59

## 2014-12-31 ENCOUNTER — Encounter: Payer: Self-pay | Admitting: Cardiology

## 2015-01-06 NOTE — Progress Notes (Signed)
Cardiology Office Note   Date:  01/07/2015   ID:  Michael Herrera, DOB 08-May-1951, MRN [id number]  PCP:  Abigail Miyamoto, MD    Chief Complaint  Patient presents with  . Follow-up    CAD, HTN, dyslipidemia      History of Present Illness: Michael Herrera is a 64 y.o. male with a history of ASCAD with PCI of the mid to distal RCA and LAD and dyslipidemia, whose anginal equivalent is tooth pain.  He underwent cardiac cath due to recurrent angina showing moderately diseased left main, moderate disease of the ostial LAD, severe disease of the ostial D1, severe disease of the ostial left circ extending back into the left main and ostial LAD and patent RCA stent. EF was 55%. He was referred to CVTS and underwent CABG with LIMA to LAD, SVG to OM1 and SVG to D2 on 08/17/2014.  He presents back today for followup.  He denies any anginal chest pain or tooth pain.[name] He has some LE edema. He denies any palpitations, dizziness or syncope. He has some DOE.  He is walking 11 miles daily along with ellipitcal workouts.      Past Medical History  Diagnosis Date  . GERD (gastroesophageal reflux disease)   . Hypercholesteremia   . Low back pain   . Coronary atherosclerosis of native coronary artery 2001    a. s/p PCI with  3.0 x 32 Promus DES mLAD. Ca L main w/o sig obst, mod dz in mLCx. Moderate focal lesion in OM1. Mild to mod dz & patent stents in RCA  . Skin cancer of face   . History of blood transfusion 1956  . Dyslipidemia   . Carotid artery occlusion 07/2014    1-39% bilateral plaque  . Edema extremities 08/31/2014  . Anemia associated with acute blood loss 08/31/2014    Past Surgical History  Procedure Laterality Date  . Coronary angioplasty with stent placement  2001; 10/29/2013    PCI of RCA "X 2"; PCI of LAD  . Tonsillectomy and adenoidectomy  ~ 1956  . Knee arthroscopy Right 1980's  . Knee arthroscopy Left 2007; 2008  . Refractive surgery Bilateral ?2007  .  Skin cancer excision Left     "cheek"  . Left heart catheterization with coronary angiogram N/A 10/29/2013    Procedure: LEFT HEART CATHETERIZATION WITH CORONARY ANGIOGRAM;  Surgeon: Jettie Booze, MD;  Location[facility] CATH LAB;  Service: Cardiovascular;  Laterality: N/A;  . Left heart catheterization with coronary angiogram N/A 08/14/2014    Procedure: LEFT HEART CATHETERIZATION WITH CORONARY ANGIOGRAM;  Surgeon: Jettie Booze, MD;  Location: [facility] CATH LAB;  Service: Cardiovascular;  Laterality: N/A;  . Coronary artery bypass graft N/A 08/17/2014    Procedure: CORONARY ARTERY BYPASS GRAFTING (CABG);  Surgeon: Melrose Nakayama, MD;  Location: [place];  Service: Open Heart Surgery;  Laterality: N/A;  Times three using left internal mammary artery and endoscopically harvested right saphenous vein     Current Outpatient Prescriptions  Medication Sig Dispense Refill  . acetaminophen (TYLENOL) 500 MG tablet Take 500 mg by mouth every 6 (six) hours as needed for mild pain or moderate pain.    [name] aspirin 81 MG tablet Take 81 mg by mouth every evening.     [name] atorvastatin (LIPITOR) 80 MG tablet Take 1 tablet (80 mg total) by mouth daily. (Patient taking differently: Take  80 mg by mouth every evening. ) 90 tablet 3  . esomeprazole (NEXIUM) 20 MG capsule Take 20 mg by mouth daily as needed.     . ezetimibe (ZETIA) 10 MG tablet Take 10 mg by mouth daily.    . metoprolol tartrate (LOPRESSOR) 25 MG tablet Take 1 tablet (25 mg total) by mouth 2 (two) times daily. (Patient taking differently: Take 12.5 mg by mouth 2 (two) times daily. ) 180 tablet 2  . Omega-3 Fatty Acids (FISH OIL) 1200 MG CAPS Take 1,200 mg by mouth every morning.    . vitamin C (ASCORBIC ACID) 500 MG tablet Take 500 mg by mouth every morning.     . vitamin E 400 UNIT capsule Take 400 Units by mouth daily.     No current facility-administered medications for this visit.    Allergies:   Review of patient's allergies indicates no known  allergies.    Social History:  The patient  reports that he has never smoked. He has never used smokeless tobacco. He reports that he drinks about 1.8 oz of alcohol per week. He reports that he does not use illicit drugs.   Family History:  The patient's family history includes Dementia in his father.    ROS:  Please see the history of present illness.   Otherwise, review of systems are positive for none.   All other systems are reviewed and negative.    PHYSICAL EXAM: VS:  BP 110/60 mmHg  Pulse 77  Ht 5\' 9"  (1.753 m)  Wt 131 lb 12.8 oz (59.784 kg)  BMI 19.45 kg/m2  SpO2 99% , BMI Body mass index is 19.45 kg/(m^2). GEN: Well nourished, well developed, in no acute distress HEENT: normal Neck: no JVD, carotid bruits, or masses Cardiac: RRR; no murmurs, rubs, or gallops,no edema  Respiratory:  clear to auscultation bilaterally, normal work of breathing GI: soft, nontender, nondistended, + BS MS: no deformity or atrophy Skin: warm and dry, no rash Neuro:  Strength and sensation are intact Psych: euthymic mood, full affect   EKG:  EKG is not ordered today.    Recent Labs: 08/18/2014: Magnesium 2.0 09/07/2014: BUN 22; Creatinine 0.91; Hemoglobin 10.8*; Platelets 477.0*; Potassium 4.3; Sodium 137 10/12/2014: ALT 28    Lipid Panel    Component Value Date/Time   CHOL 83 10/12/2014 0740   TRIG 72.0 10/12/2014 0740   HDL 30.50* 10/12/2014 0740   CHOLHDL 3 10/12/2014 0740   VLDL 14.4 10/12/2014 0740   LDLCALC 38 10/12/2014 0740      Wt Readings from Last 3 Encounters:  01/07/15 131 lb 12.8 oz (59.784 kg)  11/17/14 136 lb (61.689 kg)  10/20/14 138 lb (62.596 kg)     ASSESSMENT AND PLAN:  1. ASCAD with history of RCA and LAD stenting.Repeat cath 07/2015 with progression of CAD in LAD/diagonal/LM and patent stent in RCA s/p CABG with LIMA to LAD, SVG to OM and SVG to D2.He has not had any further angina.   - continue ASA  2. Dyslipidemia - Continue high dose statin  and Zetia. LDL at goal but HDL still low at 30 - he is aggressively exercising.  Recheck in 4 months 3. RLE edema secondary to SVG harvest - resolved  4.  Bilateral mild carotid artery stenosis- continue ASA    Current medicines are reviewed at length with the patient today.  The patient does not have concerns regarding medicines.  The following changes have been made:  no change  Labs/ tests  ordered today: See above Assessment and Plan No orders of the defined types were placed in this encounter.     Disposition:   FU with me in 1 year  Signed, Sueanne Margarita, MD  01/07/2015 8:15 AM    [facility] [address], [place], [place]  [address] Phone: [phone]; Fax: [phone]

## 2015-01-07 ENCOUNTER — Ambulatory Visit (INDEPENDENT_AMBULATORY_CARE_PROVIDER_SITE_OTHER): Payer: PPO | Admitting: Cardiology

## 2015-01-07 ENCOUNTER — Encounter: Payer: Self-pay | Admitting: Cardiology

## 2015-01-07 VITALS — BP 110/60 | HR 77 | Ht 69.0 in | Wt 131.8 lb

## 2015-01-07 DIAGNOSIS — E785 Hyperlipidemia, unspecified: Secondary | ICD-10-CM | POA: Diagnosis not present

## 2015-01-07 DIAGNOSIS — I2510 Atherosclerotic heart disease of native coronary artery without angina pectoris: Secondary | ICD-10-CM

## 2015-01-07 DIAGNOSIS — I6523 Occlusion and stenosis of bilateral carotid arteries: Secondary | ICD-10-CM | POA: Diagnosis not present

## 2015-01-07 MED ORDER — ATORVASTATIN CALCIUM 80 MG PO TABS: 80.0000 mg | ORAL_TABLET | Freq: Every evening | ORAL | Status: AC

## 2015-01-07 MED ORDER — EZETIMIBE 10 MG PO TABS: 10.0000 mg | ORAL_TABLET | Freq: Every day | ORAL | Status: DC

## 2015-01-07 MED ORDER — METOPROLOL TARTRATE 25 MG PO TABS: 12.5000 mg | ORAL_TABLET | Freq: Two times a day (BID) | ORAL | Status: DC

## 2015-01-07 MED ORDER — ATORVASTATIN CALCIUM 80 MG PO TABS: 80.0000 mg | ORAL_TABLET | Freq: Every evening | ORAL | Status: DC

## 2015-01-07 MED ORDER — NITROGLYCERIN 0.4 MG SL SUBL: 0.4000 mg | SUBLINGUAL_TABLET | SUBLINGUAL | Status: DC | PRN

## 2015-01-07 MED ORDER — NITROGLYCERIN 0.4 MG SL SUBL: 0.4000 mg | SUBLINGUAL_TABLET | SUBLINGUAL | Status: AC | PRN

## 2015-01-07 MED ORDER — EZETIMIBE 10 MG PO TABS: 10.0000 mg | ORAL_TABLET | Freq: Every day | ORAL | Status: AC

## 2015-01-07 NOTE — Patient Instructions (Signed)
Medication Instructions:  Your physician recommends that you continue on your current medications as directed. Please refer to the Current Medication list given to you today.   Labwork: Your physician recommends that you return for FASTING lab work in: 4 months    Testing/Procedures: None  Follow-Up: Your physician wants you to follow-up in: 1 year with Dr. Radford Pax. You will receive a reminder letter in the mail two months in advance. If you don't receive a letter, please call our office to schedule the follow-up appointment.   Any Other Special Instructions Will Be Listed Below (If Applicable).

## 2015-03-25 ENCOUNTER — Other Ambulatory Visit (INDEPENDENT_AMBULATORY_CARE_PROVIDER_SITE_OTHER): Payer: PPO | Admitting: *Deleted

## 2015-03-25 DIAGNOSIS — E785 Hyperlipidemia, unspecified: Secondary | ICD-10-CM | POA: Diagnosis not present

## 2015-03-25 LAB — HEPATIC FUNCTION PANEL
ALBUMIN: 4.4 g/dL (ref 3.5–5.2)
ALT: 38 U/L (ref 0–53)
AST: 32 U/L (ref 0–37)
Alkaline Phosphatase: 92 U/L (ref 39–117)
BILIRUBIN TOTAL: 1 mg/dL (ref 0.2–1.2)
Bilirubin, Direct: 0.2 mg/dL (ref 0.0–0.3)
Total Protein: 6.6 g/dL (ref 6.0–8.3)

## 2015-03-25 LAB — LIPID PANEL
Cholesterol: 91 mg/dL (ref 0–200)
HDL: 36.2 mg/dL — ABNORMAL LOW (ref >39.00)
LDL Cholesterol: 44 mg/dL (ref 0–99)
NonHDL: 54.64
TRIGLYCERIDES: 52 mg/dL (ref 0.0–149.0)
Total CHOL/HDL Ratio: 3
VLDL: 10.4 mg/dL (ref 0.0–40.0)

## 2015-09-08 IMAGING — CR DG CHEST 2V
2 series · 2 of 2 positions shown · non-contrast
Comparison: 09/22/2014

CLINICAL DATA: Status post CABG x3

EXAM:
CHEST  2 VIEW

[view not recorded (1 of 2)]
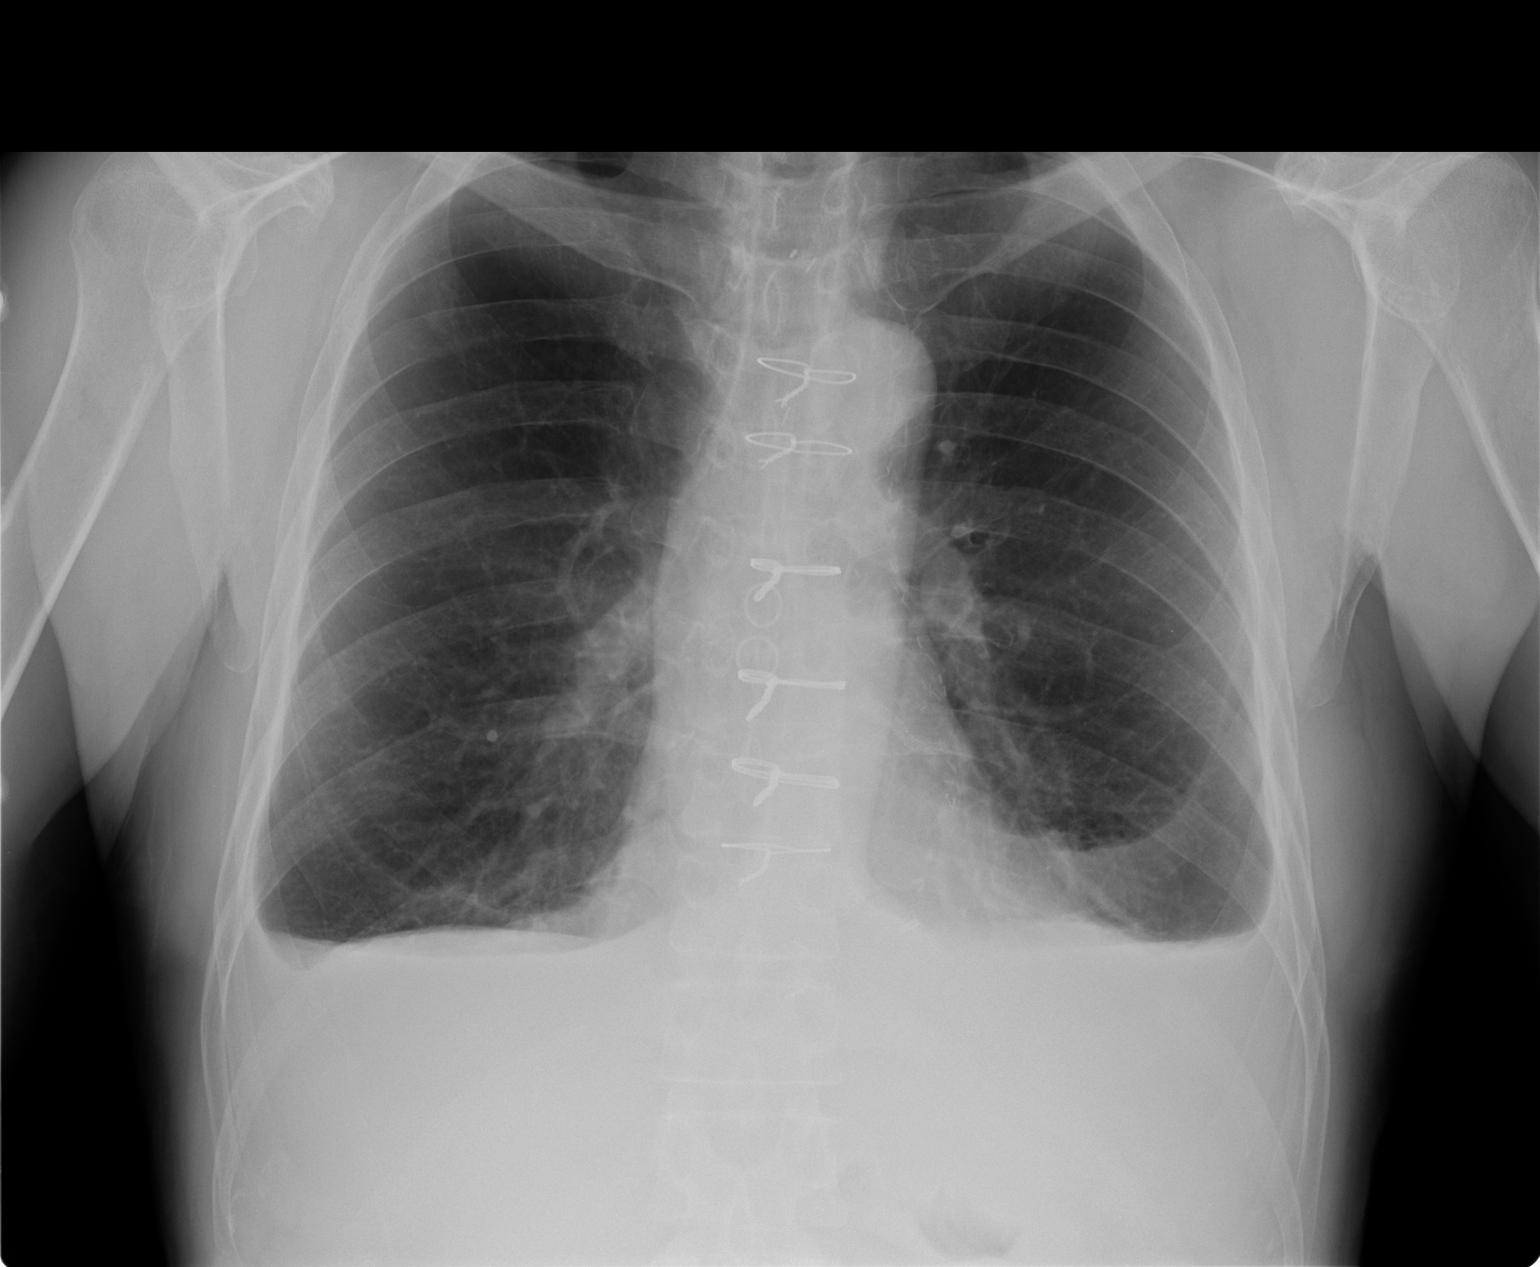

[view not recorded (2 of 2)]
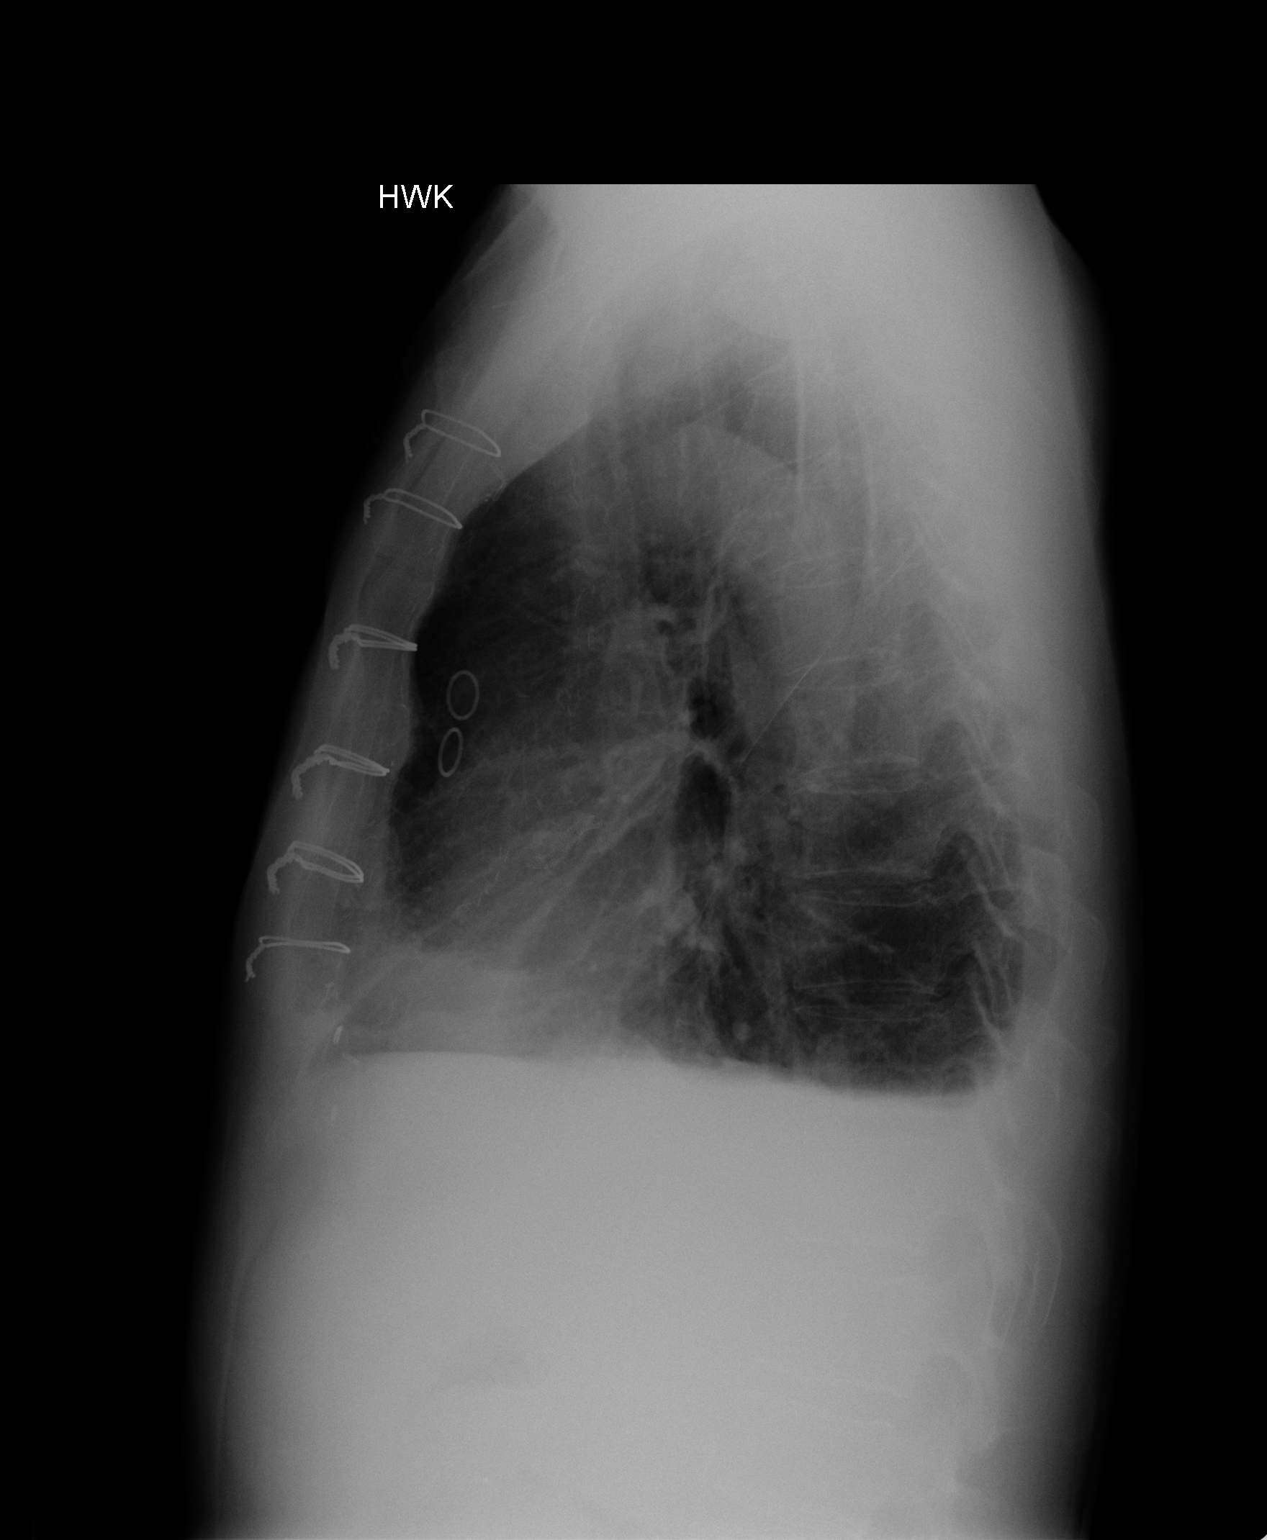

[2 of 2 positions shown; findings below may reference images not displayed]

FINDINGS: Normal heart size and aortic contours post CABG.

Previously noted right upper lobe nodule is not is convincing on
today's study. Given multiple recent chest x-rays without nodule in
this region, suspect summation shadows on the prior study.

Small, layering pleural effusions. No edema or pneumonia. No
pneumothorax.
IMPRESSION: Small bilateral pleural effusion, decreased on the left since
09/22/2014.

## 2015-10-06 IMAGING — CR DG CHEST 2V
2 series · 2 of 2 positions shown · non-contrast
Comparison: 10/20/2014 and earlier.

CLINICAL DATA: 63-year-old male with coronary artery disease, prior
heart surgery. Shortness of Breath. Subsequent encounter.

EXAM:
CHEST  2 VIEW

[w chest pa]
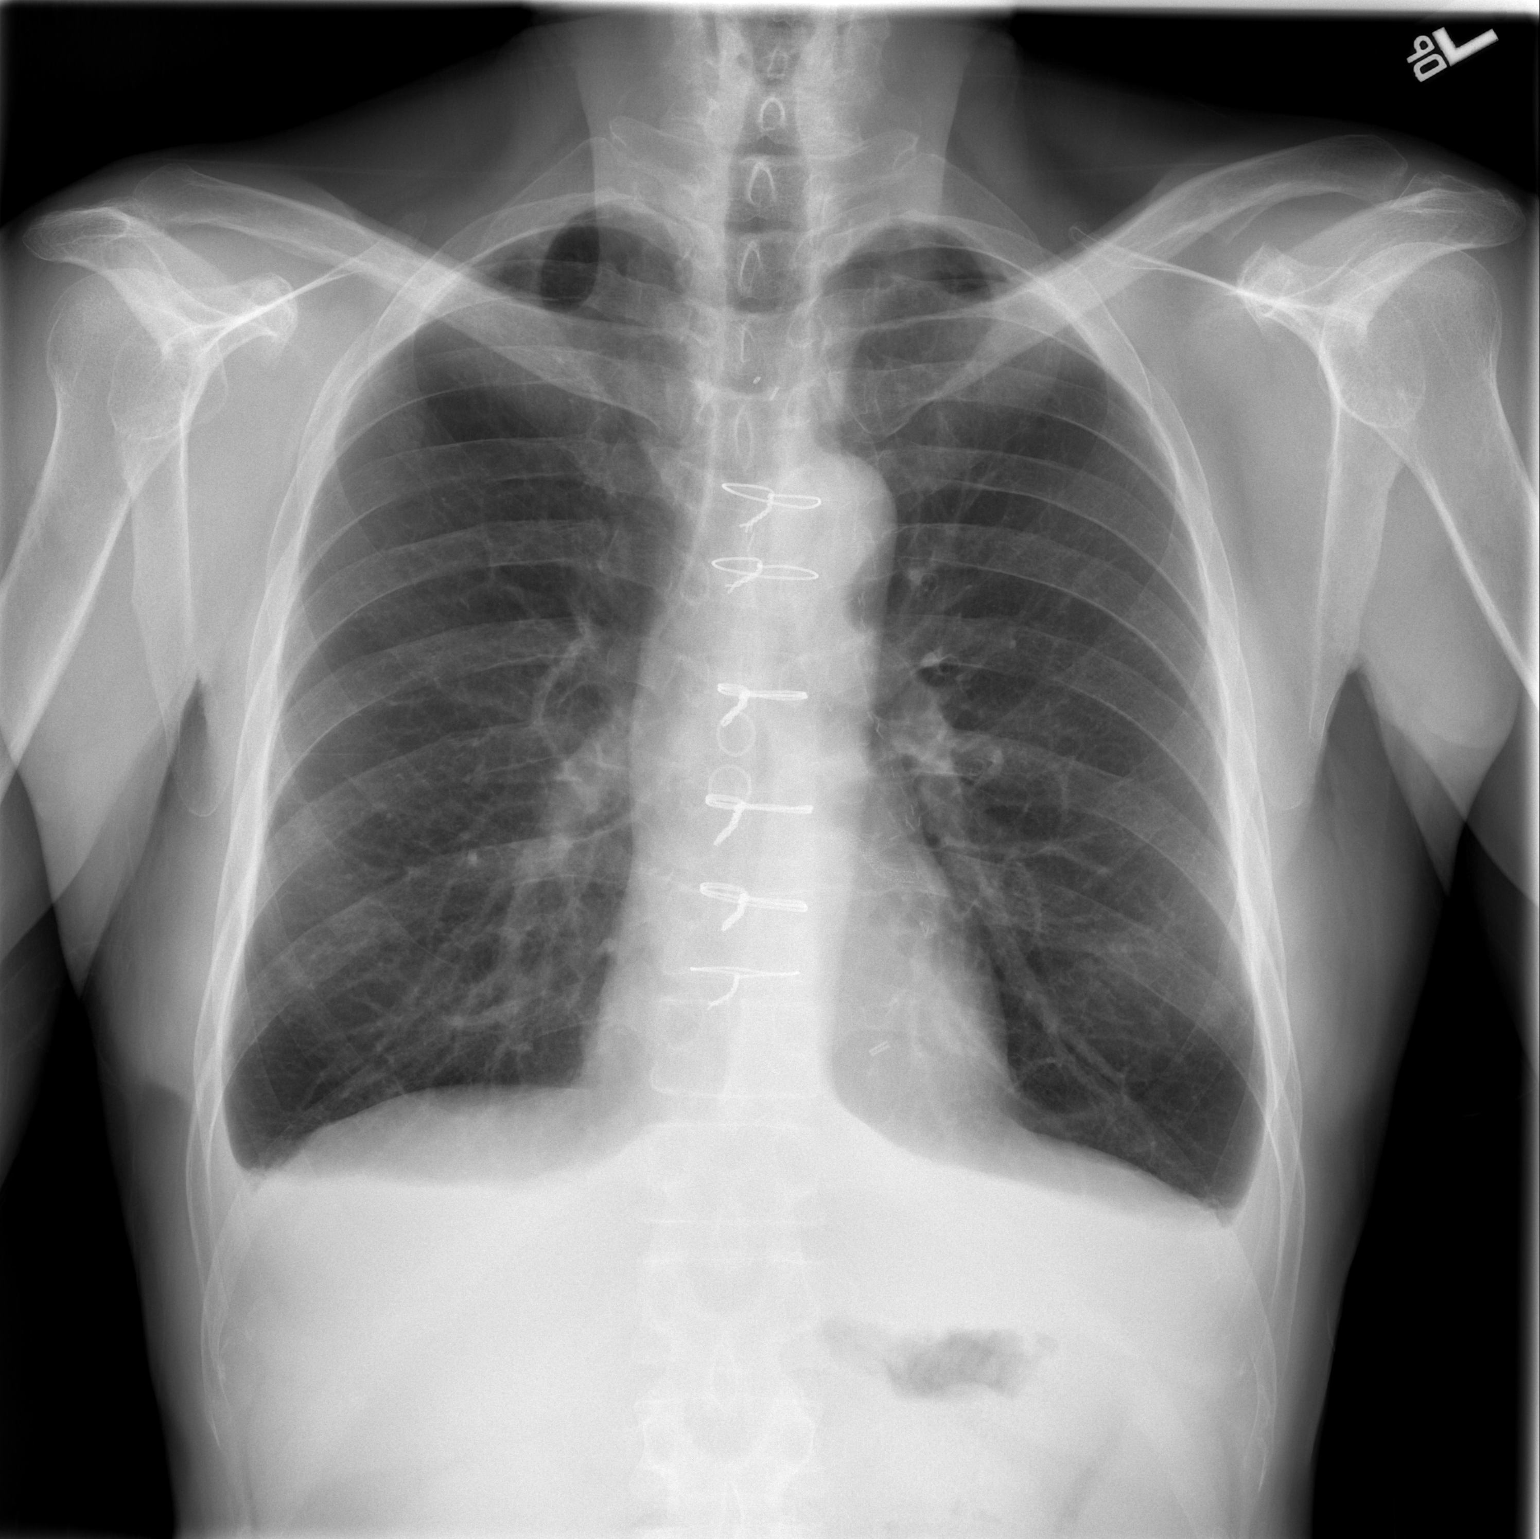

[w chest lat]
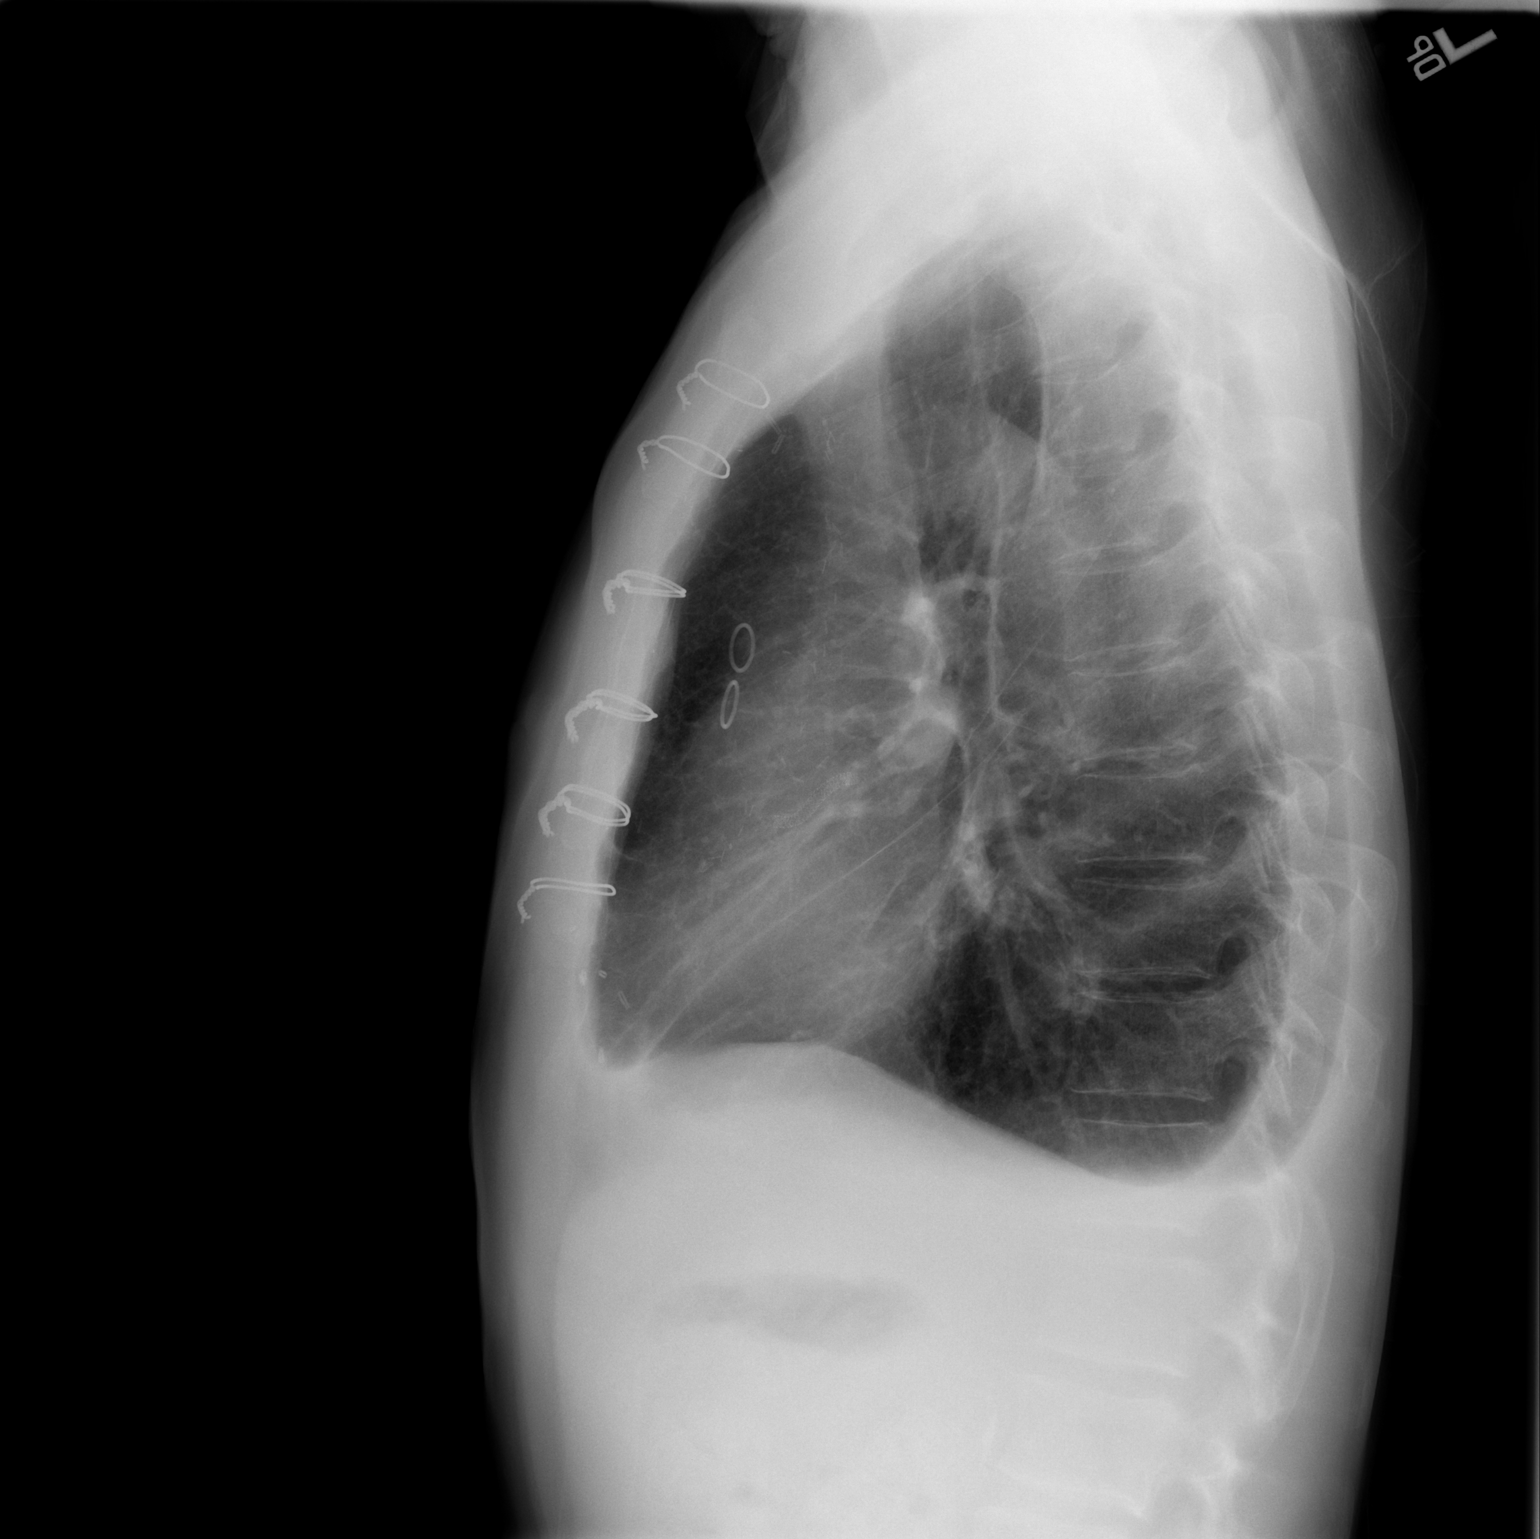

[2 of 2 positions shown; findings below may reference images not displayed]

FINDINGS: Bilateral pleural effusions have mildly regressed, small volume
residual. No pneumothorax or pulmonary edema. No areas of worsening
ventilation. Stable cardiac size and mediastinal contours. Sequelae
of CABG. No acute osseous abnormality identified.
IMPRESSION: Regressed but not resolved pleural effusions. No new cardiopulmonary
abnormality.

## 2016-01-13 ENCOUNTER — Encounter: Payer: Self-pay | Admitting: Cardiology

## 2016-01-13 ENCOUNTER — Ambulatory Visit (INDEPENDENT_AMBULATORY_CARE_PROVIDER_SITE_OTHER): Payer: PPO | Admitting: Cardiology

## 2016-01-13 VITALS — BP 124/62 | HR 52 | Ht 69.0 in | Wt 135.8 lb

## 2016-01-13 DIAGNOSIS — I6523 Occlusion and stenosis of bilateral carotid arteries: Secondary | ICD-10-CM | POA: Diagnosis not present

## 2016-01-13 DIAGNOSIS — T50905A Adverse effect of unspecified drugs, medicaments and biological substances, initial encounter: Secondary | ICD-10-CM

## 2016-01-13 DIAGNOSIS — R001 Bradycardia, unspecified: Secondary | ICD-10-CM | POA: Diagnosis not present

## 2016-01-13 DIAGNOSIS — I2510 Atherosclerotic heart disease of native coronary artery without angina pectoris: Secondary | ICD-10-CM | POA: Diagnosis not present

## 2016-01-13 DIAGNOSIS — E785 Hyperlipidemia, unspecified: Secondary | ICD-10-CM

## 2016-01-13 DIAGNOSIS — I2583 Coronary atherosclerosis due to lipid rich plaque: Principal | ICD-10-CM

## 2016-01-13 HISTORY — DX: Bradycardia, unspecified: R00.1

## 2016-01-13 LAB — LIPID PANEL
CHOL/HDL RATIO: 1.8 ratio (ref <=5.0)
CHOLESTEROL: 99 mg/dL — AB (ref 125–200)
HDL: 55 mg/dL (ref >=40)
LDL Cholesterol: 33 mg/dL (ref <130)
Triglycerides: 57 mg/dL (ref <150)
VLDL: 11 mg/dL (ref <30)

## 2016-01-13 LAB — HEPATIC FUNCTION PANEL
ALT: 31 U/L (ref 9–46)
AST: 27 U/L (ref 10–35)
Albumin: 4.7 g/dL (ref 3.6–5.1)
Alkaline Phosphatase: 87 U/L (ref 40–115)
BILIRUBIN DIRECT: 0.3 mg/dL — AB (ref <=0.2)
BILIRUBIN TOTAL: 1.2 mg/dL (ref 0.2–1.2)
Indirect Bilirubin: 0.9 mg/dL (ref 0.2–1.2)
Total Protein: 6.9 g/dL (ref 6.1–8.1)

## 2016-01-13 NOTE — Progress Notes (Signed)
Cardiology Office Note    Date:  01/13/2016   ID:  Michael Herrera, DOB [birth year], MRN [id number]  PCP:  Abigail Miyamoto, MD  Cardiologist:  Fransico Him, MD   Chief Complaint  Patient presents with  . Coronary Artery Disease  . Hyperlipidemia    History of Present Illness:  Michael Herrera is a 65 y.o. male with a history of ASCAD with PCI of the mid to distal RCA and LAD and dyslipidemia, whose anginal equivalent is tooth pain.  He underwent cardiac cath due to recurrent angina showing moderately diseased left main, moderate disease of the ostial LAD, severe disease of the ostial D1, severe disease of the ostial left circ extending back into the left main and ostial LAD and patent RCA stent. EF was 55%. He was referred to CVTS and underwent CABG with LIMA to LAD, SVG to OM1 and SVG to D2 on 08/17/2014. He presents back today for followup. He denies any anginal chest pain or tooth pain, SOB, DOE or LE edema. He denies any palpitations, dizziness or syncope.  He is walking 10 miles daily.  He would like to get off of the low dose lopressor.     Past Medical History  Diagnosis Date  . GERD (gastroesophageal reflux disease)   . Hypercholesteremia   . Low back pain   . Coronary atherosclerosis of native coronary artery 2001    a. s/p PCI with  3.0 x 32 Promus DES mLAD. Ca L main w/o sig obst, mod dz in mLCx. Moderate focal lesion in OM1. Mild to mod dz & patent stents in RCA  . Skin cancer of face   . History of blood transfusion 1956  . Dyslipidemia   . Carotid artery occlusion 07/2014    1-39% bilateral plaque  . Edema extremities 08/31/2014  . Anemia associated with acute blood loss 08/31/2014  . 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 Coronary angioplasty with stent placement  2001; 10/29/2013    PCI of RCA "X 2"; PCI of LAD  . Tonsillectomy and adenoidectomy  ~ 1956  . Knee arthroscopy Right 1980's  . Knee arthroscopy  Left 2007; 2008  . Refractive surgery Bilateral ?2007  . Skin cancer excision Left     "cheek"  . Left heart catheterization with coronary angiogram N/A 10/29/2013    Procedure: LEFT HEART CATHETERIZATION WITH CORONARY ANGIOGRAM;  Surgeon: Jettie Booze, MD;  Location: [facility] CATH LAB;  Service: Cardiovascular;  Laterality: N/A;  . Left heart catheterization with coronary angiogram N/A 08/14/2014    Procedure: LEFT HEART CATHETERIZATION WITH CORONARY ANGIOGRAM;  Surgeon: Jettie Booze, MD;  Location: [facility] CATH LAB;  Service: Cardiovascular;  Laterality: N/A;  . Coronary artery bypass graft N/A 08/17/2014    Procedure: CORONARY ARTERY BYPASS GRAFTING (CABG);  Surgeon: Melrose Nakayama, MD;  Location: [facility];  Service: Open Heart Surgery;  Laterality: N/A;  Times three using left internal mammary artery and endoscopically harvested right saphenous vein    Current Medications: Outpatient Prescriptions Prior to Visit  Medication Sig Dispense Refill  . aspirin 81 MG tablet Take 81 mg by mouth every evening.     [name] atorvastatin (LIPITOR) 80 MG tablet Take 1 tablet (80 mg total) by mouth every evening. 90 tablet 3  . esomeprazole (NEXIUM) 20 MG capsule Take 20 mg by mouth daily as needed.     . ezetimibe (ZETIA) 10 MG tablet Take 1 tablet (  10 mg total) by mouth daily. 90 tablet 3  . metoprolol tartrate (LOPRESSOR) 25 MG tablet Take 0.5 tablets (12.5 mg total) by mouth 2 (two) times daily. 90 tablet 3  . nitroGLYCERIN (NITROSTAT) 0.4 MG SL tablet Place 1 tablet (0.4 mg total) under the tongue every 5 (five) minutes as needed for chest pain. 100 tablet 0  . Omega-3 Fatty Acids (FISH OIL) 1200 MG CAPS Take 1,200 mg by mouth every morning.    . vitamin C (ASCORBIC ACID) 500 MG tablet Take 500 mg by mouth every morning.     . vitamin E 400 UNIT capsule Take 400 Units by mouth daily.    [name] acetaminophen (TYLENOL) 500 MG tablet Take 500 mg by mouth every 6 (six) hours as needed for mild pain or moderate  pain.     No facility-administered medications prior to visit.     Allergies:   Review of patient's allergies indicates no known allergies.   Social History   Social History  . Marital Status: Married    Spouse Name: N/A  . Number of Children: N/A  . Years of Education: N/A   Social History Main Topics  . Smoking status: Never Smoker   . Smokeless tobacco: Never Used  . Alcohol Use: 1.8 oz/week    3 Cans of beer per week     Comment: 1 beer 3 times weekly  . Drug Use: No  . Sexual Activity: Yes   Other Topics Concern  . None   Social History Narrative     Family History:  The patient's family history includes Dementia in his father.   ROS:   Please see the history of present illness.    ROS All other systems reviewed and are negative.   PHYSICAL EXAM:   VS:  BP 124/62 mmHg  Pulse 52  Ht 5\' 9"  (1.753 m)  Wt 135 lb 12.8 oz (61.598 kg)  BMI 20.04 kg/m2   GEN: Well nourished, well developed, in no acute distress HEENT: normal Neck: no JVD, carotid bruits, or masses Cardiac: RRR; no murmurs, rubs, or gallops,no edema.  Intact distal pulses bilaterally.  Respiratory:  clear to auscultation bilaterally, normal work of breathing GI: soft, nontender, nondistended, + BS MS: no deformity or atrophy Skin: warm and dry, no rash Neuro:  Alert and Oriented x 3, Strength and sensation are intact Psych: euthymic mood, full affect  Wt Readings from Last 3 Encounters:  01/13/16 135 lb 12.8 oz (61.598 kg)  01/07/15 131 lb 12.8 oz (59.784 kg)  11/17/14 136 lb (61.689 kg)      Studies/Labs Reviewed:   EKG:  EKG is  ordered today.  The ekg ordered today demonstrates sinus bradycardia at 52bpm with no ST changes  Recent Labs: 03/25/2015: ALT 38   Lipid Panel    Component Value Date/Time   CHOL 91 03/25/2015 0725   TRIG 52.0 03/25/2015 0725   HDL 36.20* 03/25/2015 0725   CHOLHDL 3 03/25/2015 0725   VLDL 10.4 03/25/2015 0725   LDLCALC 44 03/25/2015 0725     Additional studies/ records that were reviewed today include:  none    ASSESSMENT:    1. Coronary artery disease due to lipid rich plaque   2. Dyslipidemia   3. Carotid artery stenosis, bilateral   4. Drug-induced bradycardia      PLAN:  In order of problems listed above:  1. ASCAD s/p CABG with no angina - continue ASA/statin/BB 2. Dyslipidemia with LDL goal < 70.  Continue statin/zetia.  Check FLp and ALT.  3. Carotid artery stenosis bilateral 1-39%.  Continue ASA/statin.  Repeat dopplers. 4. Drug induced bradycardia - asymptomatic but he would like to get off of the BB.  I told him that was fine.  I have instructed him to decrease metoprolol to 12.5mg  daily for 2 days then 12.5mg  qod  For 4 days and stop.    Medication Adjustments/Labs and Tests Ordered: Current medicines are reviewed at length with the patient today.  Concerns regarding medicines are outlined above.  Medication changes, Labs and Tests ordered today are listed in the Patient Instructions below.  There are no Patient Instructions on file for this visit.   Signed, Fransico Him, MD  01/13/2016 9:25 AM    [facility] [place], [place], [place]  [address] Phone: [phone]; Fax: [phone]

## 2016-01-13 NOTE — Patient Instructions (Signed)
Medication Instructions:  1) DECREASE METOPROLOL to 12.5 mg daily for 2 days then decrease to 12.5 mg every other day for 2 doses then STOP METOPROLOL  Labwork: TODAY: LFTs, Lipids  Testing/Procedures: Your physician has requested that you have a carotid duplex. This test is an ultrasound of the carotid arteries in your neck. It looks at blood flow through these arteries that supply the brain with blood. Allow one hour for this exam. There are no restrictions or special instructions.  Follow-Up: Your physician wants you to follow-up in: 1 year with Dr. Radford Pax. You will receive a reminder letter in the mail two months in advance. If you don't receive a letter, please call our office to schedule the follow-up appointment.   Any Other Special Instructions Will Be Listed Below (If Applicable).     If you need a refill on your cardiac medications before your next appointment, please call your pharmacy.

## 2016-01-20 ENCOUNTER — Ambulatory Visit (HOSPITAL_COMMUNITY)
Admission: RE | Admit: 2016-01-20 | Discharge: 2016-01-20 | Disposition: A | Discharge status: Home / Self Care | Payer: PPO | Source: Ambulatory Visit | Attending: Cardiology | Admitting: Cardiology

## 2016-01-20 DIAGNOSIS — I6523 Occlusion and stenosis of bilateral carotid arteries: Secondary | ICD-10-CM

## 2016-01-21 ENCOUNTER — Encounter (HOSPITAL_COMMUNITY): Payer: Self-pay | Admitting: Cardiology

## 2016-03-08 ENCOUNTER — Encounter: Payer: Self-pay | Admitting: Podiatry

## 2016-03-08 ENCOUNTER — Ambulatory Visit (INDEPENDENT_AMBULATORY_CARE_PROVIDER_SITE_OTHER): Payer: PPO | Admitting: Podiatry

## 2016-03-08 VITALS — BP 134/82 | HR 70 | Resp 14

## 2016-03-08 DIAGNOSIS — B351 Tinea unguium: Secondary | ICD-10-CM

## 2016-03-08 DIAGNOSIS — Q846 Other congenital malformations of nails: Secondary | ICD-10-CM | POA: Diagnosis not present

## 2016-03-08 DIAGNOSIS — L602 Onychogryphosis: Secondary | ICD-10-CM

## 2016-03-08 NOTE — Progress Notes (Signed)
   Subjective:    Patient ID: Michael Herrera, male    DOB: 1951/08/01, 65 y.o.   MRN: [id number]  HPI this patient presents the office with chief complaint of a right great toenail that has thickened over time. He states he's had no history of trauma or injury to the toenail. He states the nail has separated from the nailbed approximately one and a half years ago. He says there is occasional pain and discomfort since he is very active. He denies any drainage from the site. He presents the office today for an evaluation and treatment of this condition    Review of Systems  All other systems reviewed and are negative.      Objective:   Physical Exam GENERAL APPEARANCE: Alert, conversant. Appropriately groomed. No acute distress.  VASCULAR: Pedal pulses are  palpable at  [facility] and PT bilateral.  Capillary refill time is immediate to all digits,  Normal temperature gradient.  Digital hair growth is present bilateral  NEUROLOGIC: sensation is normal to 5.07 monofilament at 5/5 sites bilateral.  Light touch is intact bilateral, Muscle strength normal.  MUSCULOSKELETAL: acceptable muscle strength, tone and stability bilateral.  Intrinsic muscluature intact bilateral.  Rectus appearance of foot and digits noted bilateral.   DERMATOLOGIC: skin color, texture, and turgor are within normal limits.  No preulcerative lesions or ulcers  are seen, no interdigital maceration noted.  No open lesions present.  Digital nails are asymptomatic. No drainage noted.  The distal half of the hallux toenail is unattached from nail bed.  No redness or swelling.  The unattached nail is thickened disfigured and discolored nail.         Assessment & Plan:  Thickened nail right hallux.  Onychomycosis right hallux  IE  Debridement of right hallux toenail.  Discussed conservative vs. Surgical treatment.  RTC prn   Gardiner Barefoot DPM

## 2016-06-08 DIAGNOSIS — Z85828 Personal history of other malignant neoplasm of skin: Secondary | ICD-10-CM | POA: Diagnosis not present

## 2016-06-08 DIAGNOSIS — D0439 Carcinoma in situ of skin of other parts of face: Secondary | ICD-10-CM | POA: Diagnosis not present

## 2016-06-08 DIAGNOSIS — D1801 Hemangioma of skin and subcutaneous tissue: Secondary | ICD-10-CM | POA: Diagnosis not present

## 2016-06-08 DIAGNOSIS — C4441 Basal cell carcinoma of skin of scalp and neck: Secondary | ICD-10-CM | POA: Diagnosis not present

## 2016-06-08 DIAGNOSIS — L820 Inflamed seborrheic keratosis: Secondary | ICD-10-CM | POA: Diagnosis not present

## 2016-06-08 DIAGNOSIS — D225 Melanocytic nevi of trunk: Secondary | ICD-10-CM | POA: Diagnosis not present

## 2016-06-08 DIAGNOSIS — D2271 Melanocytic nevi of right lower limb, including hip: Secondary | ICD-10-CM | POA: Diagnosis not present

## 2016-06-08 DIAGNOSIS — L570 Actinic keratosis: Secondary | ICD-10-CM | POA: Diagnosis not present

## 2016-06-08 DIAGNOSIS — L821 Other seborrheic keratosis: Secondary | ICD-10-CM | POA: Diagnosis not present

## 2016-06-08 DIAGNOSIS — L814 Other melanin hyperpigmentation: Secondary | ICD-10-CM | POA: Diagnosis not present

## 2016-06-08 DIAGNOSIS — D2272 Melanocytic nevi of left lower limb, including hip: Secondary | ICD-10-CM | POA: Diagnosis not present

## 2016-06-19 DIAGNOSIS — C4441 Basal cell carcinoma of skin of scalp and neck: Secondary | ICD-10-CM | POA: Diagnosis not present

## 2016-06-19 DIAGNOSIS — D0439 Carcinoma in situ of skin of other parts of face: Secondary | ICD-10-CM | POA: Diagnosis not present

## 2016-06-19 DIAGNOSIS — Z85828 Personal history of other malignant neoplasm of skin: Secondary | ICD-10-CM | POA: Diagnosis not present

## 2016-08-25 DIAGNOSIS — M791 Myalgia: Secondary | ICD-10-CM | POA: Diagnosis not present

## 2016-08-25 DIAGNOSIS — Z23 Encounter for immunization: Secondary | ICD-10-CM | POA: Diagnosis not present

## 2016-08-30 DIAGNOSIS — D126 Benign neoplasm of colon, unspecified: Secondary | ICD-10-CM | POA: Diagnosis not present

## 2016-08-30 DIAGNOSIS — Z1211 Encounter for screening for malignant neoplasm of colon: Secondary | ICD-10-CM | POA: Diagnosis not present

## 2016-09-05 DIAGNOSIS — Z1211 Encounter for screening for malignant neoplasm of colon: Secondary | ICD-10-CM | POA: Diagnosis not present

## 2016-09-05 DIAGNOSIS — D126 Benign neoplasm of colon, unspecified: Secondary | ICD-10-CM | POA: Diagnosis not present

## 2016-11-13 ENCOUNTER — Encounter: Payer: Self-pay | Admitting: Cardiology

## 2016-11-30 ENCOUNTER — Encounter (INDEPENDENT_AMBULATORY_CARE_PROVIDER_SITE_OTHER): Payer: Self-pay

## 2016-11-30 ENCOUNTER — Ambulatory Visit (INDEPENDENT_AMBULATORY_CARE_PROVIDER_SITE_OTHER): Payer: PPO | Admitting: Cardiology

## 2016-11-30 ENCOUNTER — Encounter: Payer: Self-pay | Admitting: Cardiology

## 2016-11-30 VITALS — BP 115/62 | HR 65 | Ht 69.0 in | Wt 132.8 lb

## 2016-11-30 DIAGNOSIS — I6523 Occlusion and stenosis of bilateral carotid arteries: Secondary | ICD-10-CM | POA: Diagnosis not present

## 2016-11-30 DIAGNOSIS — I2510 Atherosclerotic heart disease of native coronary artery without angina pectoris: Secondary | ICD-10-CM | POA: Diagnosis not present

## 2016-11-30 DIAGNOSIS — E785 Hyperlipidemia, unspecified: Secondary | ICD-10-CM | POA: Diagnosis not present

## 2016-11-30 NOTE — Patient Instructions (Signed)

## 2016-11-30 NOTE — Progress Notes (Signed)
Cardiology Office Note    Date:  11/30/2016   ID:  Michael Herrera, DOB January 12, 1951, MRN [id number]  PCP:  Beatris Si  Cardiologist:  Fransico Him, MD   Chief Complaint  Patient presents with  . Coronary Artery Disease    History of Present Illness:  Michael Herrera is a 66 y.o. male with a history of ASCAD with PCI of the mid to distal RCA and LAD and dyslipidemia, whose anginal equivalent is tooth pain.  He underwent cardiac cath due to recurrent angina showing moderately diseased left main, moderate disease of the ostial LAD, severe disease of the ostial D1, severe disease of the ostial left circ extending back into the left main and ostial LAD and patent RCA stent. EF was 55%. He was referred to CVTS and underwent CABG with LIMA to LAD, SVG to OM1 and SVG to D2 on 08/17/2014. He presents back today for followup and is doing well. He denies any anginal chest pain or tooth pain, SOB, DOE, PND, orthopnea, LE edema, palpitations, dizziness or syncope.  He continues to walk 10 miles daily.    Past Medical History:  Diagnosis Date  . Anemia associated with acute blood loss 08/31/2014  . Carotid artery occlusion 07/2014   1-39% bilateral plaque  . Coronary atherosclerosis of native coronary artery 2001   s/p PCI with  3.0 x 32 Promus DES mLAD. Ca L main w/o sig obst, mod dz in mLCx. Moderate focal lesion in OM1. Mild to mod dz & patent stents in RCA.  Repeat cath 2016 with progression of CAD with angina S/P CABG with LIMA to LAD, SVG to OM1 and SVG to D2 on 08/17/2014  . 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 Dyslipidemia   . Edema extremities 08/31/2014  . GERD (gastroesophageal reflux disease)   . History of blood transfusion 1956  . Low back pain   . Skin cancer of face     Past Surgical History:  Procedure Laterality Date  . CORONARY ANGIOPLASTY WITH STENT PLACEMENT  2001; 10/29/2013   PCI of RCA "X 2"; PCI of LAD  . CORONARY ARTERY BYPASS GRAFT N/A 08/17/2014   Procedure: CORONARY ARTERY BYPASS GRAFTING (CABG);  Surgeon: Melrose Nakayama, MD;  Location: [facility];  Service: Open Heart Surgery;  Laterality: N/A;  Times three using left internal mammary artery and endoscopically harvested right saphenous vein  . KNEE ARTHROSCOPY Right 1980's  . KNEE ARTHROSCOPY Left 2007; 2008  . LEFT HEART CATHETERIZATION WITH CORONARY ANGIOGRAM N/A 10/29/2013   Procedure: LEFT HEART CATHETERIZATION WITH CORONARY ANGIOGRAM;  Surgeon: Jettie Booze, MD;  Location: [facility] CATH LAB;  Service: Cardiovascular;  Laterality: N/A;  . LEFT HEART CATHETERIZATION WITH CORONARY ANGIOGRAM N/A 08/14/2014   Procedure: LEFT HEART CATHETERIZATION WITH CORONARY ANGIOGRAM;  Surgeon: Jettie Booze, MD;  Location: [facility] CATH LAB;  Service: Cardiovascular;  Laterality: N/A;  . REFRACTIVE SURGERY Bilateral ?2007  . SKIN CANCER EXCISION Left    "cheek"  . TONSILLECTOMY AND ADENOIDECTOMY  ~ 1956    Current Medications: Current Meds  Medication Sig  . aspirin 81 MG tablet Take 81 mg by mouth every evening.   [name] atorvastatin (LIPITOR) 80 MG tablet Take 1 tablet (80 mg total) by mouth every evening.  . ezetimibe (ZETIA) 10 MG tablet Take 1 tablet (10 mg total) by mouth daily.  . nitroGLYCERIN (NITROSTAT) 0.4 MG SL tablet Place 1 tablet (0.4 mg total) under the tongue every 5 (five) minutes as needed for chest  pain.  . Omega-3 Fatty Acids (FISH OIL) 1200 MG CAPS Take 1,200 mg by mouth every morning.  . vitamin C (ASCORBIC ACID) 500 MG tablet Take 500 mg by mouth every morning.   . vitamin E 400 UNIT capsule Take 400 Units by mouth daily.  . [DISCONTINUED] esomeprazole (NEXIUM) 20 MG capsule Take 20 mg by mouth daily as needed.     Allergies:   Patient has no known allergies.   Social History   Social History  . Marital status: Married    Spouse name: N/A  . Number of children: N/A  . Years of education: N/A   Social History Main Topics  . Smoking status: Never Smoker  . Smokeless  tobacco: Never Used  . Alcohol use 1.8 oz/week    3 Cans of beer per week     Comment: 1 beer 3 times weekly  . Drug use: No  . Sexual activity: Yes   Other Topics Concern  . None   Social History Narrative  . None     Family History:  The patient's family history includes Dementia in his father.   ROS:   Please see the history of present illness.    ROS All other systems reviewed and are negative.  No flowsheet data found.     PHYSICAL EXAM:   VS:  BP 115/62   Pulse 65   Ht 5\' 9"  (1.753 m)   Wt 132 lb 12.8 oz (60.2 kg)   SpO2 98%   BMI 19.61 kg/m    GEN: Well nourished, well developed, in no acute distress  HEENT: normal  Neck: no JVD, carotid bruits, or masses Cardiac: RRR; no murmurs, rubs, or gallops,no edema.  Intact distal pulses bilaterally.  Respiratory:  clear to auscultation bilaterally, normal work of breathing GI: soft, nontender, nondistended, + BS MS: no deformity or atrophy  Skin: warm and dry, no rash Neuro:  Alert and Oriented x 3, Strength and sensation are intact Psych: euthymic mood, full affect  Wt Readings from Last 3 Encounters:  11/30/16 132 lb 12.8 oz (60.2 kg)  01/13/16 135 lb 12.8 oz (61.6 kg)  01/07/15 131 lb 12.8 oz (59.8 kg)      Studies/Labs Reviewed:   EKG:  EKG is not ordered today.    Recent Labs: 01/13/2016: ALT 31   Lipid Panel    Component Value Date/Time   CHOL 99 (L) 01/13/2016 0954   TRIG 57 01/13/2016 0954   HDL 55 01/13/2016 0954   CHOLHDL 1.8 01/13/2016 0954   VLDL 11 01/13/2016 0954   LDLCALC 33 01/13/2016 0954    Additional studies/ records that were reviewed today include:  none    ASSESSMENT:    1. Coronary artery disease involving native coronary artery of native heart without angina pectoris   2. Bilateral carotid artery stenosis   3. Dyslipidemia      PLAN:  In order of problems listed above:  1. ASCAD s/p remote PCI of the mid to distal RCA and LAD with recurrent angina with  progression of CAD by cath and now s/p CABG.  He has not had any anginal symptoms.  He will continue ASA/statin 2. HTN - BP controlled on diet and exercise alone. 3. Hyperlipidemia with LDL goal < 70.  He will continuie on statin and zetia.    Medication Adjustments/Labs and Tests Ordered: Current medicines are reviewed at length with the patient today.  Concerns regarding medicines are outlined above.  Medication changes, Labs and  Tests ordered today are listed in the Patient Instructions below.  There are no Patient Instructions on file for this visit.   Signed, Fransico Him, MD  11/30/2016 8:22 AM    [facility] [place], [place], [place]  [address] Phone: [phone]; Fax: [phone]

## 2017-06-07 DIAGNOSIS — L570 Actinic keratosis: Secondary | ICD-10-CM | POA: Diagnosis not present

## 2017-06-07 DIAGNOSIS — C44629 Squamous cell carcinoma of skin of left upper limb, including shoulder: Secondary | ICD-10-CM | POA: Diagnosis not present

## 2017-06-07 DIAGNOSIS — L821 Other seborrheic keratosis: Secondary | ICD-10-CM | POA: Diagnosis not present

## 2017-06-07 DIAGNOSIS — D225 Melanocytic nevi of trunk: Secondary | ICD-10-CM | POA: Diagnosis not present

## 2017-06-07 DIAGNOSIS — D2261 Melanocytic nevi of right upper limb, including shoulder: Secondary | ICD-10-CM | POA: Diagnosis not present

## 2017-06-07 DIAGNOSIS — Z85828 Personal history of other malignant neoplasm of skin: Secondary | ICD-10-CM | POA: Diagnosis not present

## 2017-06-26 DIAGNOSIS — C44629 Squamous cell carcinoma of skin of left upper limb, including shoulder: Secondary | ICD-10-CM | POA: Diagnosis not present

## 2017-06-26 DIAGNOSIS — Z85828 Personal history of other malignant neoplasm of skin: Secondary | ICD-10-CM | POA: Diagnosis not present

## 2017-08-20 DIAGNOSIS — R110 Nausea: Secondary | ICD-10-CM | POA: Diagnosis not present

## 2017-08-20 DIAGNOSIS — B349 Viral infection, unspecified: Secondary | ICD-10-CM | POA: Diagnosis not present

## 2017-08-23 DIAGNOSIS — I2510 Atherosclerotic heart disease of native coronary artery without angina pectoris: Secondary | ICD-10-CM | POA: Diagnosis not present

## 2017-08-23 DIAGNOSIS — E7800 Pure hypercholesterolemia, unspecified: Secondary | ICD-10-CM | POA: Diagnosis not present

## 2017-08-23 DIAGNOSIS — J069 Acute upper respiratory infection, unspecified: Secondary | ICD-10-CM | POA: Diagnosis not present

## 2017-09-06 DIAGNOSIS — Z85828 Personal history of other malignant neoplasm of skin: Secondary | ICD-10-CM | POA: Diagnosis not present

## 2017-09-06 DIAGNOSIS — D692 Other nonthrombocytopenic purpura: Secondary | ICD-10-CM | POA: Diagnosis not present

## 2017-09-06 DIAGNOSIS — L570 Actinic keratosis: Secondary | ICD-10-CM | POA: Diagnosis not present

## 2017-09-06 DIAGNOSIS — Z08 Encounter for follow-up examination after completed treatment for malignant neoplasm: Secondary | ICD-10-CM | POA: Diagnosis not present

## 2018-02-05 DIAGNOSIS — J029 Acute pharyngitis, unspecified: Secondary | ICD-10-CM | POA: Diagnosis not present

## 2018-02-05 DIAGNOSIS — J0140 Acute pansinusitis, unspecified: Secondary | ICD-10-CM | POA: Diagnosis not present

## 2018-02-22 DIAGNOSIS — Z85828 Personal history of other malignant neoplasm of skin: Secondary | ICD-10-CM | POA: Diagnosis not present

## 2018-02-22 DIAGNOSIS — L820 Inflamed seborrheic keratosis: Secondary | ICD-10-CM | POA: Diagnosis not present

## 2018-02-22 DIAGNOSIS — L570 Actinic keratosis: Secondary | ICD-10-CM | POA: Diagnosis not present

## 2018-02-22 DIAGNOSIS — L821 Other seborrheic keratosis: Secondary | ICD-10-CM | POA: Diagnosis not present

## 2018-06-11 DIAGNOSIS — L565 Disseminated superficial actinic porokeratosis (DSAP): Secondary | ICD-10-CM | POA: Diagnosis not present

## 2018-06-11 DIAGNOSIS — L570 Actinic keratosis: Secondary | ICD-10-CM | POA: Diagnosis not present

## 2018-06-11 DIAGNOSIS — D225 Melanocytic nevi of trunk: Secondary | ICD-10-CM | POA: Diagnosis not present

## 2018-06-11 DIAGNOSIS — L821 Other seborrheic keratosis: Secondary | ICD-10-CM | POA: Diagnosis not present

## 2018-06-11 DIAGNOSIS — Z85828 Personal history of other malignant neoplasm of skin: Secondary | ICD-10-CM | POA: Diagnosis not present

## 2018-06-26 DIAGNOSIS — Z23 Encounter for immunization: Secondary | ICD-10-CM | POA: Diagnosis not present

## 2018-07-19 ENCOUNTER — Encounter (HOSPITAL_COMMUNITY): Payer: Self-pay | Admitting: Emergency Medicine

## 2018-07-19 ENCOUNTER — Telehealth: Payer: Self-pay | Admitting: Cardiology

## 2018-07-19 ENCOUNTER — Other Ambulatory Visit: Payer: Self-pay

## 2018-07-19 ENCOUNTER — Inpatient Hospital Stay (HOSPITAL_COMMUNITY)
Admission: EM | Admit: 2018-07-19 | Discharge: 2018-07-23 | DRG: 247 | Disposition: A | Discharge status: Home / Self Care | Payer: PPO | Attending: Internal Medicine | Admitting: Internal Medicine

## 2018-07-19 ENCOUNTER — Emergency Department (HOSPITAL_COMMUNITY): Payer: PPO

## 2018-07-19 DIAGNOSIS — I2510 Atherosclerotic heart disease of native coronary artery without angina pectoris: Secondary | ICD-10-CM | POA: Diagnosis present

## 2018-07-19 DIAGNOSIS — M545 Low back pain: Secondary | ICD-10-CM | POA: Diagnosis present

## 2018-07-19 DIAGNOSIS — I10 Essential (primary) hypertension: Secondary | ICD-10-CM | POA: Diagnosis present

## 2018-07-19 DIAGNOSIS — R079 Chest pain, unspecified: Secondary | ICD-10-CM | POA: Diagnosis present

## 2018-07-19 DIAGNOSIS — I213 ST elevation (STEMI) myocardial infarction of unspecified site: Secondary | ICD-10-CM | POA: Diagnosis present

## 2018-07-19 DIAGNOSIS — K219 Gastro-esophageal reflux disease without esophagitis: Secondary | ICD-10-CM | POA: Diagnosis present

## 2018-07-19 DIAGNOSIS — E785 Hyperlipidemia, unspecified: Secondary | ICD-10-CM | POA: Diagnosis present

## 2018-07-19 DIAGNOSIS — I25118 Atherosclerotic heart disease of native coronary artery with other forms of angina pectoris: Secondary | ICD-10-CM | POA: Diagnosis present

## 2018-07-19 DIAGNOSIS — I25119 Atherosclerotic heart disease of native coronary artery with unspecified angina pectoris: Secondary | ICD-10-CM | POA: Diagnosis not present

## 2018-07-19 DIAGNOSIS — T50905A Adverse effect of unspecified drugs, medicaments and biological substances, initial encounter: Secondary | ICD-10-CM

## 2018-07-19 DIAGNOSIS — Z955 Presence of coronary angioplasty implant and graft: Secondary | ICD-10-CM

## 2018-07-19 DIAGNOSIS — Z85828 Personal history of other malignant neoplasm of skin: Secondary | ICD-10-CM | POA: Diagnosis not present

## 2018-07-19 DIAGNOSIS — Z7982 Long term (current) use of aspirin: Secondary | ICD-10-CM

## 2018-07-19 DIAGNOSIS — R001 Bradycardia, unspecified: Secondary | ICD-10-CM | POA: Diagnosis not present

## 2018-07-19 DIAGNOSIS — Z951 Presence of aortocoronary bypass graft: Secondary | ICD-10-CM | POA: Diagnosis not present

## 2018-07-19 DIAGNOSIS — M549 Dorsalgia, unspecified: Secondary | ICD-10-CM | POA: Diagnosis not present

## 2018-07-19 DIAGNOSIS — R0789 Other chest pain: Secondary | ICD-10-CM

## 2018-07-19 HISTORY — DX: Unspecified hearing loss, left ear: H91.92

## 2018-07-19 LAB — BASIC METABOLIC PANEL
Anion gap: 11 (ref 5–15)
BUN: 24 mg/dL — ABNORMAL HIGH (ref 8–23)
CO2: 28 mmol/L (ref 22–32)
Calcium: 9.7 mg/dL (ref 8.9–10.3)
Chloride: 102 mmol/L (ref 98–111)
Creatinine, Ser: 1.09 mg/dL (ref 0.61–1.24)
GFR calc Af Amer: >60 mL/min (ref >60)
GFR calc non Af Amer: >60 mL/min (ref >60)
Glucose, Bld: 89 mg/dL (ref 70–99)
Potassium: 4.2 mmol/L (ref 3.5–5.1)
Sodium: 141 mmol/L (ref 135–145)

## 2018-07-19 LAB — I-STAT TROPONIN, ED: Troponin i, poc: 0.01 ng/mL (ref 0.00–0.08)

## 2018-07-19 LAB — CBC
HCT: 43.5 % (ref 39.0–52.0)
Hemoglobin: 14.4 g/dL (ref 13.0–17.0)
MCH: 30.8 pg (ref 26.0–34.0)
MCHC: 33.1 g/dL (ref 30.0–36.0)
MCV: 93.1 fL (ref 80.0–100.0)
Platelets: 143 10*3/uL — ABNORMAL LOW (ref 150–400)
RBC: 4.67 MIL/uL (ref 4.22–5.81)
RDW: 12.2 % (ref 11.5–15.5)
WBC: 6.9 10*3/uL (ref 4.0–10.5)
nRBC: 0 % (ref 0.0–0.2)

## 2018-07-19 LAB — MRSA PCR SCREENING: MRSA by PCR: NEGATIVE

## 2018-07-19 LAB — TROPONIN I

## 2018-07-19 MED ORDER — ASPIRIN EC 325 MG PO TBEC
325.0000 mg | DELAYED_RELEASE_TABLET | Freq: Every day | ORAL | Status: DC
  Administered 2018-07-19 – 2018-07-21 (×3): 325 mg via ORAL
  Filled 2018-07-19 (×3): qty 1

## 2018-07-19 MED ORDER — MORPHINE SULFATE (PF) 2 MG/ML IV SOLN: 2.0000 mg | INTRAVENOUS | Status: DC | PRN

## 2018-07-19 MED ORDER — NITROGLYCERIN 0.4 MG SL SUBL: 0.4000 mg | SUBLINGUAL_TABLET | SUBLINGUAL | Status: DC | PRN

## 2018-07-19 MED ORDER — ATORVASTATIN CALCIUM 80 MG PO TABS
80.0000 mg | ORAL_TABLET | Freq: Every evening | ORAL | Status: DC
  Administered 2018-07-19 – 2018-07-22 (×4): 80 mg via ORAL
  Filled 2018-07-19 (×4): qty 1

## 2018-07-19 MED ORDER — ACETAMINOPHEN 325 MG PO TABS
650.0000 mg | ORAL_TABLET | ORAL | Status: DC | PRN
  Administered 2018-07-20 – 2018-07-22 (×2): 650 mg via ORAL
  Filled 2018-07-19 (×2): qty 2

## 2018-07-19 MED ORDER — ONDANSETRON HCL 4 MG/2ML IJ SOLN
4.0000 mg | Freq: Four times a day (QID) | INTRAMUSCULAR | Status: DC | PRN
  Filled 2018-07-19: qty 2

## 2018-07-19 MED ORDER — ENOXAPARIN SODIUM 80 MG/0.8ML ~~LOC~~ SOLN
1.0000 mg/kg | Freq: Two times a day (BID) | SUBCUTANEOUS | Status: DC
  Administered 2018-07-19 – 2018-07-20 (×2): 65 mg via SUBCUTANEOUS
  Filled 2018-07-19 (×3): qty 0.65

## 2018-07-19 MED ORDER — EZETIMIBE 10 MG PO TABS
10.0000 mg | ORAL_TABLET | Freq: Every day | ORAL | Status: DC
  Administered 2018-07-20 – 2018-07-22 (×3): 10 mg via ORAL
  Filled 2018-07-19 (×3): qty 1

## 2018-07-19 NOTE — Telephone Encounter (Signed)
Spoke with the patient, he accepted going to the hospital. Our office had no available space. He had no further questions.

## 2018-07-19 NOTE — H&P (Signed)
History and Physical    Michael Herrera GBT:[id number] DOB: [birth year] DOA: 07/19/2018  PCP: [place], [place]  Patient coming from: Home  I have personally briefly reviewed patient's old medical records in [place]  Chief Complaint: Chest pain  HPI: Michael Herrera is a 67 y.o. male with medical history significant for CAD s/p PCI w/ DES 2015 & CABG x3 2016 and HLD who presents to the ED with substernal chest pain with radiation to his jaw and teeth.  Patient reports similar symptoms prior to his catheterization in 2015 and CABG in 2016.  His teeth pain is an anginal equivalent and he says this is the first time he has been experiencing it since his CABG.  He normally walks 10 miles per day and goes to the gym 3 times a week.  Yesterday he noticed some chest discomfort which he thought was related to indigestion or reflux.  He took oral nitroglycerin last night with relief.  Today he began to have pain in his teeth and a dull chest discomfort with pain in his jaw as well.  He called his cardiologist office, Dr. Radford Pax, who recommended he come to the ED.  He denies any associated dyspnea, palpitations, nausea, vomiting, lightheadedness, dizziness, or abdominal pain.  ED Course:  Initial vitals showed BP 130/72, pulse 56, RR 16, temp 98.73F, SPO2 99% on room air.  I-STAT troponin was 0.01.  CBC and BMP were largely unremarkable.  Chest x-ray showed prior CABG changes without focal consolidation, effusion, or infiltrate.  EKG showed normal sinus rhythm without acute ischemic changes.  The ED provider discussed the case with cardiology Dr. Radford Pax who recommended admission to the hospital service and will see in the morning.  Review of Systems: As per HPI otherwise 10 point review of systems negative.    Past Medical History:  Diagnosis Date  . Anemia associated with acute blood loss 08/31/2014  . Carotid artery occlusion 07/2014   1-39% bilateral plaque  . Coronary  atherosclerosis of native coronary artery 2001   s/p PCI with  3.0 x 32 Promus DES mLAD. Ca L main w/o sig obst, mod dz in mLCx. Moderate focal lesion in OM1. Mild to mod dz & patent stents in RCA.  Repeat cath 2016 with progression of CAD with angina S/P CABG with LIMA to LAD, SVG to OM1 and SVG to D2 on 08/17/2014  . 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 Dyslipidemia   . Edema extremities 08/31/2014  . GERD (gastroesophageal reflux disease)   . History of blood transfusion 1956  . Low back pain   . Skin cancer of face     Past Surgical History:  Procedure Laterality Date  . CORONARY ANGIOPLASTY WITH STENT PLACEMENT  2001; 10/29/2013   PCI of RCA "X 2"; PCI of LAD  . CORONARY ARTERY BYPASS GRAFT N/A 08/17/2014   Procedure: CORONARY ARTERY BYPASS GRAFTING (CABG);  Surgeon: Melrose Nakayama, MD;  Location: [facility];  Service: Open Heart Surgery;  Laterality: N/A;  Times three using left internal mammary artery and endoscopically harvested right saphenous vein  . KNEE ARTHROSCOPY Right 1980's  . KNEE ARTHROSCOPY Left 2007; 2008  . LEFT HEART CATHETERIZATION WITH CORONARY ANGIOGRAM N/A 10/29/2013   Procedure: LEFT HEART CATHETERIZATION WITH CORONARY ANGIOGRAM;  Surgeon: Jettie Booze, MD;  Location: [facility] CATH LAB;  Service: Cardiovascular;  Laterality: N/A;  . LEFT HEART CATHETERIZATION WITH CORONARY ANGIOGRAM N/A 08/14/2014   Procedure: LEFT HEART CATHETERIZATION WITH CORONARY ANGIOGRAM;  Surgeon: Jettie Booze, MD;  Location: [facility] CATH LAB;  Service: Cardiovascular;  Laterality: N/A;  . REFRACTIVE SURGERY Bilateral ?2007  . SKIN CANCER EXCISION Left    "cheek"  . TONSILLECTOMY AND ADENOIDECTOMY  ~ 1956     reports that he has never smoked. He has never used smokeless tobacco. He reports current alcohol use of about 3.0 standard drinks of alcohol per week. He reports that he does not use drugs.  No Known Allergies  Family History  Problem Relation Age of Onset  . Dementia Father       Prior to Admission medications   Medication Sig Start Date End Date Taking? Authorizing Provider  aspirin 81 MG tablet Take 81 mg by mouth every evening.    Yes [provider]  atorvastatin (LIPITOR) 80 MG tablet Take 1 tablet (80 mg total) by mouth every evening. 01/07/15  Yes Turner, Eber Hong, MD  ezetimibe (ZETIA) 10 MG tablet Take 1 tablet (10 mg total) by mouth daily. 01/07/15  Yes Turner, Eber Hong, MD  nitroGLYCERIN (NITROSTAT) 0.4 MG SL tablet Place 1 tablet (0.4 mg total) under the tongue every 5 (five) minutes as needed for chest pain. 01/07/15  Yes Turner, Eber Hong, MD  Omega-3 Fatty Acids (FISH OIL) 1200 MG CAPS Take 1,200 mg by mouth every morning.   Yes [provider]  vitamin C (ASCORBIC ACID) 500 MG tablet Take 500 mg by mouth every morning.    Yes [provider]  vitamin E 400 UNIT capsule Take 400 Units by mouth daily.   Yes [provider]    Physical Exam: Vitals:   07/19/18 1525 07/19/18 1637 07/19/18 1815 07/19/18 1940  BP: 130/72 (!) 142/77 (!) 146/75   Pulse: (!) 56     Resp: 16  12   Temp: 98.1 F (36.7 C)     TempSrc: Oral     SpO2: 99%     Weight:    66 kg    Constitutional: NAD, calm, comfortable, sitting up in a chair Eyes: PERRL, lids and conjunctivae normal ENMT: Mucous membranes are moist. Posterior pharynx clear of any exudate or lesions.Normal dentition.  Neck: normal, supple, no masses. Respiratory: clear to auscultation bilaterally, no wheezing, no crackles. Normal respiratory effort. No accessory muscle use.  Cardiovascular: Regular rate and rhythm, no murmurs / rubs / gallops. No extremity edema. Abdomen: no tenderness, no masses palpated. No hepatosplenomegaly. Bowel sounds positive.  Musculoskeletal: no clubbing / cyanosis. No joint deformity upper and lower extremities. Good ROM, no contractures. Normal muscle tone.  Skin: no rashes, lesions, ulcers. No induration Neurologic: CN 2-12 grossly intact.  Sensation intact. Strength 5/5 in all 4.  Psychiatric: Normal judgment and insight. Alert and oriented x 3. Normal mood.     Labs on Admission: I have personally reviewed following labs and imaging studies  CBC: Recent Labs  Lab 07/19/18 1641  WBC 6.9  HGB 14.4  HCT 43.5  MCV 93.1  PLT 619*   Basic Metabolic Panel: Recent Labs  Lab 07/19/18 1641  NA 141  K 4.2  CL 102  CO2 28  GLUCOSE 89  BUN 24*  CREATININE 1.09  CALCIUM 9.7   GFR: CrCl cannot be calculated (Unknown ideal weight.). Liver Function Tests: No results for input(s): AST, ALT, ALKPHOS, BILITOT, PROT, ALBUMIN in the last 168 hours. No results for input(s): LIPASE, AMYLASE in the last 168 hours. No results for input(s): AMMONIA in the last 168 hours. Coagulation Profile: No results  for input(s): INR, PROTIME in the last 168 hours. Cardiac Enzymes: No results for input(s): CKTOTAL, CKMB, CKMBINDEX, TROPONINI in the last 168 hours. BNP (last 3 results) No results for input(s): PROBNP in the last 8760 hours. HbA1C: No results for input(s): HGBA1C in the last 72 hours. CBG: No results for input(s): GLUCAP in the last 168 hours. Lipid Profile: No results for input(s): CHOL, HDL, LDLCALC, TRIG, CHOLHDL, LDLDIRECT in the last 72 hours. Thyroid Function Tests: No results for input(s): TSH, T4TOTAL, FREET4, T3FREE, THYROIDAB in the last 72 hours. Anemia Panel: No results for input(s): VITAMINB12, FOLATE, FERRITIN, TIBC, IRON, RETICCTPCT in the last 72 hours. Urine analysis:    Component Value Date/Time   COLORURINE YELLOW 08/16/2014 1846   APPEARANCEUR CLEAR 08/16/2014 1846   LABSPEC 1.013 08/16/2014 1846   PHURINE 5.5 08/16/2014 1846   GLUCOSEU NEGATIVE 08/16/2014 1846   HGBUR NEGATIVE 08/16/2014 1846   BILIRUBINUR NEGATIVE 08/16/2014 1846   KETONESUR NEGATIVE 08/16/2014 1846   PROTEINUR NEGATIVE 08/16/2014 1846   UROBILINOGEN 1.0 08/16/2014 1846   NITRITE NEGATIVE 08/16/2014 1846   LEUKOCYTESUR  NEGATIVE 08/16/2014 1846    Radiological Exams on Admission: Dg Chest 2 View  Result Date: 07/19/2018 CLINICAL DATA:  Chest pain. EXAM: CHEST - 2 VIEW COMPARISON:  Chest x-ray dated November 17, 2014. FINDINGS: The heart size and mediastinal contours are within normal limits. Prior CABG. Normal pulmonary vascularity. No focal consolidation, pleural effusion, or pneumothorax. No acute osseous abnormality. IMPRESSION: No active cardiopulmonary disease. Electronically Signed   By: Titus Dubin M.D.   On: 07/19/2018 17:09    EKG: Independently reviewed.  Normal sinus rhythm, rate 66, no acute ischemic changes  Assessment/Plan Principal Problem:   Chest pain due to CAD [facility]) Active Problems:   Dyslipidemia   Michael Herrera is a 67 y.o. male with medical history significant for CAD s/p PCI w/ DES 2015 & CABG x3 2016 and HLD who presents to the ED with substernal chest pain with radiation to his jaw and teeth.   Chest pain in setting of CAD s/p PCI w/ DES 2015 & CABG x3 2016: Patient with anginal equivalent symptoms.  Reports similar symptoms prior to his previous cath and CABG.  At those times his EKG and cardiac enzymes were normal as they are on this admission. -Admit to cardiac telemetry -Cardiology consulted in ED, to see in a.m. -Cycle cardiac enzymes -Echocardiogram -Nitroglycerin, morphine, EKG as needed for chest pain -Aspirin 325 -Lovenox -Not on beta-blocker due to history of drug-induced bradycardia -Continue home atorvastatin and Zetia -We will make n.p.o. at midnight in case of need for repeat cardiac catheterization  HLD: -Continue home atorvastatin Zetia    DVT prophylaxis: Lovenox  Code Status: Full Code  Family Communication: Wife at bedside  Disposition Plan: Pending cardiology workup  Consults called: Cardiology called by EDP  Admission status: Observation    Zada Finders MD [facility] Pager [phone]  If 7PM-7AM, please contact  night-coverage [email or web address] Password [facility]  07/19/2018, 8:29 PM

## 2018-07-19 NOTE — ED Notes (Signed)
Patient transported to X-ray 

## 2018-07-19 NOTE — Telephone Encounter (Signed)
Needs to go to ER or be seen in clinic today

## 2018-07-19 NOTE — Telephone Encounter (Signed)
The patient has had diffuse chest pain, 4/10, comes and goes. He also has teeth pain and slight back pain. He states this is similar to the pain he had before his stents. He took a nitro yesterday and it helped resolve the pain some.

## 2018-07-19 NOTE — ED Provider Notes (Signed)
[facility] EMERGENCY DEPARTMENT Provider Note   CSN: [id number] Arrival date & time: 07/19/18  1448     History   Chief Complaint Chief Complaint  Patient presents with  . Chest Pain    HPI Michael Herrera is a 67 y.o. male with history of CAD status post CABG and PCI with stent placement, dyslipidemia, GERD presents for evaluation of acute onset, intermittent chest pain since yesterday.  He reports that the pain is dull, substernal and radiates to the bilateral shoulders and up to his jaw and teeth.  Pain does worsen with exertion, and he has had some relief with 1 sublingual nitroglycerin.  He denies any associated shortness of breath, nausea, vomiting, lightheadedness, diaphoresis, or abdominal pain.  Reports this feels similar to pain he has experienced in the past which required cardiac catheterization.  He is a non-smoker, denies recreational drug use or excessive alcohol intake.  Called his cardiologist office today and was told to present to the ED for further evaluation.  The history is provided by the patient.    Past Medical History:  Diagnosis Date  . Anemia associated with acute blood loss 08/31/2014  . Carotid artery occlusion 07/2014   1-39% bilateral plaque  . Coronary atherosclerosis of native coronary artery 2001   s/p PCI with  3.0 x 32 Promus DES mLAD. Ca L main w/o sig obst, mod dz in mLCx. Moderate focal lesion in OM1. Mild to mod dz & patent stents in RCA.  Repeat cath 2016 with progression of CAD with angina S/P CABG with LIMA to LAD, SVG to OM1 and SVG to D2 on 08/17/2014  . 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 Dyslipidemia   . Edema extremities 08/31/2014  . GERD (gastroesophageal reflux disease)   . History of blood transfusion 1956  . Low back pain   . Skin cancer of face     Patient Active Problem List   Diagnosis Date Noted  . Chest pain due to CAD ([place]) 07/19/2018  . 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 Edema extremities  08/31/2014  . Anemia associated with acute blood loss 08/31/2014  . Carotid artery stenosis 07/07/2014  . CAD (coronary artery disease) 05/13/2013  . GERD (gastroesophageal reflux disease) 05/13/2013  . Dyslipidemia 05/13/2013    Past Surgical History:  Procedure Laterality Date  . CORONARY ANGIOPLASTY WITH STENT PLACEMENT  2001; 10/29/2013   PCI of RCA "X 2"; PCI of LAD  . CORONARY ARTERY BYPASS GRAFT N/A 08/17/2014   Procedure: CORONARY ARTERY BYPASS GRAFTING (CABG);  Surgeon: Melrose Nakayama, MD;  Location: [facility];  Service: Open Heart Surgery;  Laterality: N/A;  Times three using left internal mammary artery and endoscopically harvested right saphenous vein  . KNEE ARTHROSCOPY Right 1980's  . KNEE ARTHROSCOPY Left 2007; 2008  . LEFT HEART CATHETERIZATION WITH CORONARY ANGIOGRAM N/A 10/29/2013   Procedure: LEFT HEART CATHETERIZATION WITH CORONARY ANGIOGRAM;  Surgeon: Jettie Booze, MD;  Location: [facility] CATH LAB;  Service: Cardiovascular;  Laterality: N/A;  . LEFT HEART CATHETERIZATION WITH CORONARY ANGIOGRAM N/A 08/14/2014   Procedure: LEFT HEART CATHETERIZATION WITH CORONARY ANGIOGRAM;  Surgeon: Jettie Booze, MD;  Location: [facility] CATH LAB;  Service: Cardiovascular;  Laterality: N/A;  . REFRACTIVE SURGERY Bilateral ?2007  . SKIN CANCER EXCISION Left    "cheek"  . TONSILLECTOMY AND ADENOIDECTOMY  ~ 1956        Home Medications    Prior to Admission medications   Medication Sig Start Date End Date Taking?  Authorizing Provider  aspirin 81 MG tablet Take 81 mg by mouth every evening.    Yes [provider]  atorvastatin (LIPITOR) 80 MG tablet Take 1 tablet (80 mg total) by mouth every evening. 01/07/15  Yes Turner, Eber Hong, MD  ezetimibe (ZETIA) 10 MG tablet Take 1 tablet (10 mg total) by mouth daily. 01/07/15  Yes Turner, Eber Hong, MD  nitroGLYCERIN (NITROSTAT) 0.4 MG SL tablet Place 1 tablet (0.4 mg total) under the tongue every 5 (five) minutes as needed for chest  pain. 01/07/15  Yes Turner, Eber Hong, MD  Omega-3 Fatty Acids (FISH OIL) 1200 MG CAPS Take 1,200 mg by mouth every morning.   Yes [provider]  vitamin C (ASCORBIC ACID) 500 MG tablet Take 500 mg by mouth every morning.    Yes [provider]  vitamin E 400 UNIT capsule Take 400 Units by mouth daily.   Yes [provider]    Family History Family History  Problem Relation Age of Onset  . Dementia Father     Social History Social History   Tobacco Use  . Smoking status: Never Smoker  . Smokeless tobacco: Never Used  Substance Use Topics  . Alcohol use: Yes    Alcohol/week: 3.0 standard drinks    Types: 3 Cans of beer per week    Comment: 1 beer 3 times weekly  . Drug use: No     Allergies   Patient has no known allergies.   Review of Systems Review of Systems  Constitutional: Negative for chills and fever.  Respiratory: Negative for cough and shortness of breath.   Cardiovascular: Positive for chest pain. Negative for palpitations and leg swelling.  Gastrointestinal: Negative for abdominal pain, nausea and vomiting.  Musculoskeletal: Positive for back pain.  All other systems reviewed and are negative.    Physical Exam Updated Vital Signs BP (!) 142/77   Pulse (!) 56   Temp 98.1 F (36.7 C) (Oral)   Resp 16   SpO2 99%   Physical Exam Vitals signs and nursing note reviewed.  Constitutional:      General: He is not in acute distress.    Appearance: He is well-developed.  HENT:     Head: Normocephalic and atraumatic.  Eyes:     General:        Right eye: No discharge.        Left eye: No discharge.     Conjunctiva/sclera: Conjunctivae normal.  Neck:     Musculoskeletal: Normal range of motion and neck supple.     Vascular: No JVD.     Trachea: No tracheal deviation.  Cardiovascular:     Rate and Rhythm: Normal rate.     Comments: 2+ radial and DP/PT pulses bilaterally, Homans sign absent bilaterally, no lower extremity edema,  no palpable cords, compartments are soft  Pulmonary:     Effort: Pulmonary effort is normal.  Chest:     Chest wall: No tenderness.     Comments: Well-healed midline sternotomy scar Abdominal:     General: Bowel sounds are normal. There is no distension.     Palpations: Abdomen is soft.     Tenderness: There is no abdominal tenderness.  Musculoskeletal:     Right lower leg: He exhibits no tenderness. No edema.     Left lower leg: He exhibits no tenderness. No edema.  Skin:    General: Skin is warm and dry.     Findings: No erythema.  Neurological:  Mental Status: He is alert.  Psychiatric:        Behavior: Behavior normal.      ED Treatments / Results  Labs (all labs ordered are listed, but only abnormal results are displayed) Labs Reviewed  BASIC METABOLIC PANEL - Abnormal; Notable for the following components:      Result Value   BUN 24 (*)    All other components within normal limits  CBC - Abnormal; Notable for the following components:   Platelets 143 (*)    All other components within normal limits  I-STAT TROPONIN, ED    EKG EKG Interpretation  Date/Time:  Friday July 19 2018 14:57:57 EST Ventricular Rate:  65 PR Interval:  138 QRS Duration: 84 QT Interval:  370 QTC Calculation: 384 R Axis:   86 Text Interpretation:  Normal sinus rhythm Normal ECG Confirmed by Virgel Manifold [phone]) on 07/19/2018 4:37:13 PM   Radiology Dg Chest 2 View  Result Date: 07/19/2018 CLINICAL DATA:  Chest pain. EXAM: CHEST - 2 VIEW COMPARISON:  Chest x-ray dated November 17, 2014. FINDINGS: The heart size and mediastinal contours are within normal limits. Prior CABG. Normal pulmonary vascularity. No focal consolidation, pleural effusion, or pneumothorax. No acute osseous abnormality. IMPRESSION: No active cardiopulmonary disease. Electronically Signed   By: Titus Dubin M.D.   On: 07/19/2018 17:09    Procedures Procedures (including critical care time)  Medications  Ordered in ED Medications - No data to display   Initial Impression / Assessment and Plan / ED Course  I have reviewed the triage vital signs and the nursing notes.  Pertinent labs & imaging results that were available during my care of the patient were reviewed by me and considered in my medical decision making (see chart for details).     Patient with complex cardiac history presents for evaluation of chest pain intermittently beginning yesterday.  He is afebrile, vital signs are stable.  He is nontoxic in appearance.  Pain is not pleuritic or reproducible on palpation but does appear to have an exertional component.  Chest x-ray with no acute cardiopulmonary abnormalities and EKG with no acute ischemic changes.  Initial troponin is negative.  Remainder of lab work reviewed by me generally unremarkable.  I spoke with Dr. Radford Pax, the patient's cardiologist who recommends hospitalist admission.  I spoke with Dr. Posey Pronto with [facility] who agrees to assume care of patient and bring him into the hospital for further evaluation and management.  Patient is currently chest pain-free and overall well-appearing. Final Clinical Impressions(s) / ED Diagnoses   Final diagnoses:  Dull chest pain    ED Discharge Orders    None       Debroah Baller 07/19/18 1810    Virgel Manifold, MD 07/21/18 [id number]

## 2018-07-19 NOTE — ED Notes (Signed)
Pt returned to room from xray.

## 2018-07-19 NOTE — Progress Notes (Signed)
ANTICOAGULATION CONSULT NOTE - Initial Consult  Pharmacy Consult for enoxaparin Indication: NSTEMI / ACS  No Known Allergies  Patient Measurements: TBW 66 kg Heparin Dosing Weight:   Vital Signs: Temp: 98.1 F (36.7 C) (12/13 1525) Temp Source: Oral (12/13 1525) BP: 146/75 (12/13 1815) Pulse Rate: 56 (12/13 1525)  Labs: Recent Labs    07/19/18 1641  HGB 14.4  HCT 43.5  PLT 143*  CREATININE 1.09    CrCl cannot be calculated (Unknown ideal weight.).   Medical History: Past Medical History:  Diagnosis Date  . Anemia associated with acute blood loss 08/31/2014  . Carotid artery occlusion 07/2014   1-39% bilateral plaque  . Coronary atherosclerosis of native coronary artery 2001   s/p PCI with  3.0 x 32 Promus DES mLAD. Ca L main w/o sig obst, mod dz in mLCx. Moderate focal lesion in OM1. Mild to mod dz & patent stents in RCA.  Repeat cath 2016 with progression of CAD with angina S/P CABG with LIMA to LAD, SVG to OM1 and SVG to D2 on 08/17/2014  . 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 Dyslipidemia   . Edema extremities 08/31/2014  . GERD (gastroesophageal reflux disease)   . History of blood transfusion 1956  . Low back pain   . Skin cancer of face     Assessment: 67 yo M presents with CP. Pharmacy consulted for Lovenox. Hgb and plts ok.  Goal of Therapy:  Monitor platelets by anticoagulation protocol: Yes   Plan:  Start enoxaparin 65mg  [place] Q12h Monitor  CBC, s/s of bleed   Jama Krichbaum J 07/19/2018,6:38 PM

## 2018-07-19 NOTE — Telephone Encounter (Signed)
° ° °  Pt c/o of Chest Pain: STAT if CP now or developed within 24 hours  1. Are you having CP right now? A "little" per patient  2. Are you experiencing any other symptoms (ex. SOB, nausea, vomiting, sweating)? PAIN IN TEETH  3. How long have you been experiencing CP? 2 DAYS  4. Is your CP continuous or coming and going? COMING AND GOING  5. Have you taken Nitroglycerin? YES ?

## 2018-07-19 NOTE — ED Triage Notes (Signed)
Pt here from home with c/o chest pain yesterday , pt has history of bypass , pt has had 1 nitro and 1 81mg  asa last night

## 2018-07-19 NOTE — ED Notes (Signed)
ED Provider at bedside. 

## 2018-07-20 ENCOUNTER — Observation Stay (HOSPITAL_BASED_OUTPATIENT_CLINIC_OR_DEPARTMENT_OTHER): Payer: PPO

## 2018-07-20 DIAGNOSIS — I25119 Atherosclerotic heart disease of native coronary artery with unspecified angina pectoris: Secondary | ICD-10-CM

## 2018-07-20 DIAGNOSIS — R0789 Other chest pain: Secondary | ICD-10-CM

## 2018-07-20 DIAGNOSIS — R001 Bradycardia, unspecified: Secondary | ICD-10-CM

## 2018-07-20 DIAGNOSIS — T50905A Adverse effect of unspecified drugs, medicaments and biological substances, initial encounter: Secondary | ICD-10-CM

## 2018-07-20 DIAGNOSIS — R079 Chest pain, unspecified: Secondary | ICD-10-CM | POA: Diagnosis present

## 2018-07-20 DIAGNOSIS — E785 Hyperlipidemia, unspecified: Secondary | ICD-10-CM

## 2018-07-20 LAB — LIPID PANEL
Cholesterol: 97 mg/dL (ref 0–200)
HDL: 44 mg/dL (ref >40)
LDL CALC: 41 mg/dL (ref 0–99)
Total CHOL/HDL Ratio: 2.2 RATIO
Triglycerides: 62 mg/dL (ref <150)
VLDL: 12 mg/dL (ref 0–40)

## 2018-07-20 LAB — ECHOCARDIOGRAM COMPLETE
Height: 70 in
Weight: 2328 oz

## 2018-07-20 LAB — HIV ANTIBODY (ROUTINE TESTING W REFLEX): HIV Screen 4th Generation wRfx: NONREACTIVE

## 2018-07-20 LAB — TROPONIN I: Troponin I: <0.03 ng/mL (ref <0.03)

## 2018-07-20 MED ORDER — HEPARIN (PORCINE) 25000 UT/250ML-% IV SOLN
700.0000 [IU]/h | INTRAVENOUS | Status: DC
  Administered 2018-07-20: 800 [IU]/h via INTRAVENOUS
  Administered 2018-07-22: 600 [IU]/h via INTRAVENOUS
  Filled 2018-07-20 (×2): qty 250

## 2018-07-20 MED ORDER — NITROGLYCERIN IN D5W 200-5 MCG/ML-% IV SOLN
0.0000 ug/min | INTRAVENOUS | Status: DC
  Administered 2018-07-20: 13:00:00 5 ug/min via INTRAVENOUS
  Filled 2018-07-20: qty 250

## 2018-07-20 NOTE — Progress Notes (Addendum)
PROGRESS NOTE  Michael Herrera WUJ:[id number] DOB: 1950-09-03 DOA: 07/19/2018 PCP: Orpah Melter, MD  HPI/Brief Narrative  Michael Herrera is a 67 y.o. year old male with medical history significant for  CAD s/p CABG ( 06/2015) and PCI of mid to distal RCA and LAD(2015),HTN,  HLD, GERD who presented on 07/19/2018 with chest pain and tooth pain.  Patient has excellent exercise tolerance at baseline typically walks 2 miles a day exercises 3 times a day with intensive cardio.  States 2 days prior to admission noticed dull chest pain with radiation to back and tooth/jaw similar to his prior chest pain from 2016 and 2015 when he required a CABG and stent respectively).  On yesterday patient took nitro with some relief of chest pain but again notice worsening chest pain while either walking uphill during his walks or even going up the stairs in his house.  He called his cardiologist who recommended evaluation in the ED.  In the ED his EKG was nonischemic, troponin trend has been negative, patient was admitted for observation given previous history of tooth pain/jaw pain is anginal equivalent.  Cardiology was consulted in ED.  Subjective Has dull chest pain rated 3 out of 10.  No current radiation to teeth/jaw.  No shortness of breath.  Assessment/Plan:  #Chest pain on exertion consistent with stable angina. In past has had anginal equivalent of tooth pain and is again having these symptoms which prompted his evaluation.  Currently having some slight dull chest pain.  Has significant CAD history (previous stent and CABG in 2015 and 2016).  Encouraged by nonischemic EKG and normal troponin trend.  Defer to cardiology for ischemic evaluation.  PRN nitro as needed for chest pain, monitoring on telemetry TTE ordered, currently n.p.o. in case of need for potential heart cath   #HTN, at goal currently controlled on diet and exercise.   #HLD, continue home  lipitor and Zetia (already high  intensity)  #History of drug induced bradycardia  Code Status: Full code  Family Communication: Wife updated at bedside  Disposition Plan: Pending cardiology recommendations, for ischemic evaluation patient with no cardiac history.   Consultants:  Cardiology     Procedures:  None  Antimicrobials: Anti-infectives (From admission, onward)   None         Cultures:  None  Telemetry: Yes  DVT prophylaxis: Lovenox   Objective: Vitals:   07/19/18 1637 07/19/18 1815 07/19/18 1940 07/20/18 0733  BP: (!) 142/77 (!) 146/75 (!) 144/77 106/72  Pulse:   70 60  Resp:  12 16 13   Temp:   98.8 F (37.1 C) 97.9 F (36.6 C)  TempSrc:   Oral Oral  SpO2:   100% 97%  Weight:   66 kg   Height:   5\' 10"  (1.778 m)     Intake/Output Summary (Last 24 hours) at 07/20/2018 9562 Last data filed at 07/19/2018 1940 Gross per 24 hour  Intake 240 ml  Output -  Net 240 ml   Filed Weights   07/19/18 1940  Weight: 66 kg    Exam:  Constitutional:normal appearing male Eyes: EOMI, anicteric, normal conjunctivae ENMT: Oropharynx with moist mucous membranes, normal dentition Cardiovascular: RRR no MRGs, with no peripheral edema, no chest wall tenderness Respiratory: Normal respiratory effort, clear breath sounds  Abdomen: Soft,non-tender, with no HSM Skin: No rash ulcers, or lesions. Without skin tenting  Neurologic: Grossly no focal neuro deficit. Psychiatric:Appropriate affect, and mood. Mental status AAOx3  Data Reviewed: CBC: Recent Labs  Lab 07/19/18 1641  WBC 6.9  HGB 14.4  HCT 43.5  MCV 93.1  PLT 585*   Basic Metabolic Panel: Recent Labs  Lab 07/19/18 1641  NA 141  K 4.2  CL 102  CO2 28  GLUCOSE 89  BUN 24*  CREATININE 1.09  CALCIUM 9.7   GFR: Estimated Creatinine Clearance: 60.6 mL/min (by C-G formula based on SCr of 1.09 mg/dL). Liver Function Tests: No results for input(s): AST, ALT, ALKPHOS, BILITOT, PROT, ALBUMIN in the last 168 hours. No  results for input(s): LIPASE, AMYLASE in the last 168 hours. No results for input(s): AMMONIA in the last 168 hours. Coagulation Profile: No results for input(s): INR, PROTIME in the last 168 hours. Cardiac Enzymes: Recent Labs  Lab 07/19/18 2055 07/20/18 0420  TROPONINI <0.03 <0.03   BNP (last 3 results) No results for input(s): PROBNP in the last 8760 hours. HbA1C: No results for input(s): HGBA1C in the last 72 hours. CBG: No results for input(s): GLUCAP in the last 168 hours. Lipid Profile: No results for input(s): CHOL, HDL, LDLCALC, TRIG, CHOLHDL, LDLDIRECT in the last 72 hours. Thyroid Function Tests: No results for input(s): TSH, T4TOTAL, FREET4, T3FREE, THYROIDAB in the last 72 hours. Anemia Panel: No results for input(s): VITAMINB12, FOLATE, FERRITIN, TIBC, IRON, RETICCTPCT in the last 72 hours. Urine analysis:    Component Value Date/Time   COLORURINE YELLOW 08/16/2014 1846   APPEARANCEUR CLEAR 08/16/2014 1846   LABSPEC 1.013 08/16/2014 1846   PHURINE 5.5 08/16/2014 1846   GLUCOSEU NEGATIVE 08/16/2014 1846   HGBUR NEGATIVE 08/16/2014 1846   BILIRUBINUR NEGATIVE 08/16/2014 1846   KETONESUR NEGATIVE 08/16/2014 1846   PROTEINUR NEGATIVE 08/16/2014 1846   UROBILINOGEN 1.0 08/16/2014 1846   NITRITE NEGATIVE 08/16/2014 1846   LEUKOCYTESUR NEGATIVE 08/16/2014 1846   Sepsis Labs: @LABRCNTIP (procalcitonin:4,lacticidven:4)  ) Recent Results (from the past 240 hour(s))  MRSA PCR Screening     Status: None   Collection Time: 07/19/18  7:35 PM  Result Value Ref Range Status   MRSA by PCR NEGATIVE NEGATIVE Final    Comment:        The GeneXpert MRSA Assay (FDA approved for NASAL specimens only), is one component of a comprehensive MRSA colonization surveillance program. It is not intended to diagnose MRSA infection nor to guide or monitor treatment for MRSA infections. Performed at [facility], [place] [address] [place], [place] [address]        Studies: Dg Chest 2 View  Result Date: 07/19/2018 CLINICAL DATA:  Chest pain. EXAM: CHEST - 2 VIEW COMPARISON:  Chest x-ray dated November 17, 2014. FINDINGS: The heart size and mediastinal contours are within normal limits. Prior CABG. Normal pulmonary vascularity. No focal consolidation, pleural effusion, or pneumothorax. No acute osseous abnormality. IMPRESSION: No active cardiopulmonary disease. Electronically Signed   By: Titus Dubin M.D.   On: 07/19/2018 17:09    Scheduled Meds: . aspirin EC  325 mg Oral Daily  . atorvastatin  80 mg Oral QPM  . enoxaparin (LOVENOX) injection  1 mg/kg Subcutaneous Q12H  . ezetimibe  10 mg Oral Daily    Continuous Infusions:   LOS: 0 days     Desiree Hane, MD [facility] Pager [phone]  If 7PM-7AM, please contact night-coverage [email or web address] Password [email or web address] 07/20/2018, 8:06 AM

## 2018-07-20 NOTE — Progress Notes (Signed)
ANTICOAGULATION CONSULT NOTE - Follow-up Consult  Pharmacy Consult for heparin  Indication: NSTEMI / ACS  No Known Allergies  Patient Measurements: TBW 66 kg Heparin Dosing Weight: 66kg   Vital Signs: Temp: 97.9 F (36.6 C) (12/14 0733) Temp Source: Oral (12/14 0733) BP: 106/72 (12/14 0733) Pulse Rate: 60 (12/14 0733)  Labs: Recent Labs    07/19/18 1641 07/19/18 2055 07/20/18 0420  HGB 14.4  --   --   HCT 43.5  --   --   PLT 143*  --   --   CREATININE 1.09  --   --   TROPONINI  --  <0.03 <0.03    Estimated Creatinine Clearance: 60.6 mL/min (by C-G formula based on SCr of 1.09 mg/dL).   Medical History: Past Medical History:  Diagnosis Date  . Anemia associated with acute blood loss 08/31/2014  . Carotid artery occlusion 07/2014   1-39% bilateral plaque  . Coronary atherosclerosis of native coronary artery 2001   s/p PCI with  3.0 x 32 Promus DES mLAD. Ca L main w/o sig obst, mod dz in mLCx. Moderate focal lesion in OM1. Mild to mod dz & patent stents in RCA.  Repeat cath 2016 with progression of CAD with angina S/P CABG with LIMA to LAD, SVG to OM1 and SVG to D2 on 08/17/2014  . 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 Dyslipidemia   . Edema extremities 08/31/2014  . GERD (gastroesophageal reflux disease)   . Hearing loss, left   . History of blood transfusion 1956  . Low back pain   . Skin cancer of face     Assessment: 67 yo Herrera presents with CP. Pharmacy orginally consulted for Lovenox but now patient is being change to heparin in anticipation of Cath.   Hgb and plts ok. No bleeding noted. Last dose of enoxaparin given at 1134 12/14.  Goal of Therapy:  Monitor platelets by anticoagulation protocol: Yes   Plan:  Start heparin 800 units/hr at 1900 (8 hours after enoxaparin dose).  Monitor anti-Xa level 6hrs after starting heparin then daily and CBC daily.  Monitor for s/sx of bleeding.   Azzie Roup D PGY1 Pharmacy Resident  Phone [phone] Please  use AMION for clinical pharmacists numbers  07/20/2018      1:17 PM

## 2018-07-20 NOTE — Progress Notes (Signed)
  Echocardiogram 2D Echocardiogram has been performed.  Michael Herrera 07/20/2018, 3:43 PM

## 2018-07-20 NOTE — Consult Note (Addendum)
Cardiology Consultation:   Patient ID: Michael Herrera; [id number]; 1950-10-14   Admit date: 07/19/2018 Date of Consult: 07/20/2018  Primary Care Provider: Orpah Melter, MD Primary Cardiologist: Dr. Fransico Him, MD   Patient Profile:   Michael Herrera is a 67 y.o. male with a hx of CAD with PCI of the mid to distal RCA and LAD s/p CABG 2016 and dyslipidemiawho is being seen today for the evaluation of chest pain at the request of Dr. Lonny Prude.  History of Present Illness:   Michael Herrera is a 67 year old male with a history stated above who presented to [facility] on 07/19/2017 with chest and jaw pain, noted to be his anginal equivalent per chart review.  Patient states that proximately 2 days prior to admission he noticed a dull chest pain with radiation to his back and jaw area, similar to his prior chest pain from 2015 and 2016 when he required PCI and ultimately CABG revascularization.  Yesterday, prior to presenting to the emergency department he took 1 SL NTG with some relief however it returned while completing his daily walking routine.  He called Dr. Theodosia Blender office for assistance I was referred to the emergency department for further evaluation.  In the ED, EKG with no acute ischemic changes.  Troponin, negative.  Echocardiogram pending results.  CXR negative for acute cardiopulmonary changes.  Patient states that with prior events, his EKG as well as troponins have remained negative.  Today, patient continues to have dull 3 out of 10 chest pain with no radiation.  He denies shortness of breath, palpitations, dizziness, syncope, diaphoresis, nausea or vomiting.  He is aggravated with his care while here in the hospital and is currently anticipating leaving AMA.  I have discussed that this would not be a good option for him given his current symptoms and contacted MD for further assistance.  He states that if he knew that the Cath Lab was unable to assist him over the weekend, he would have  never come to the hospital in the first place.  Patient was admitted by hospitalist service for observation given his prior history cardiology is consulted for further evaluation.  Of note, he was last seen by his primary cardiologist on 11/30/2016 and was noted to be doing well.  He denies anginal chest pain or tooth pain which is his anginal equivalent, shortness of breath, PND, orthopnea, palpitations, dizziness or syncope.  At that time, he continued to walk approximately 10 miles per day and was doing well.  He underwent cardiac cath due to recurrent angina showing moderately diseased left main, moderate disease of the ostial LAD, severe disease of the ostial D1, severe disease of the ostial left circ extending back into the left main and ostial LAD and patent RCA stent. EF was 55%. He was referred to CVTS and underwent CABG with LIMA to LAD, SVG to OM1 and SVG to D2 on 08/17/2014. He presents back today for followup and is doing well. He denies any anginal chest pain or tooth pain, SOB, DOE, PND, orthopnea, LE edema, palpitations, dizziness or syncope. He continues to walk 10 miles daily.  Past Medical History:  Diagnosis Date  . Anemia associated with acute blood loss 08/31/2014  . Carotid artery occlusion 07/2014   1-39% bilateral plaque  . Coronary atherosclerosis of native coronary artery 2001   s/p PCI with  3.0 x 32 Promus DES mLAD. Ca L main w/o sig obst, mod dz in mLCx. Moderate focal lesion in OM1.  Mild to mod dz & patent stents in RCA.  Repeat cath 2016 with progression of CAD with angina S/P CABG with LIMA to LAD, SVG to OM1 and SVG to D2 on 08/17/2014  . 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 Dyslipidemia   . Edema extremities 08/31/2014  . GERD (gastroesophageal reflux disease)   . Hearing loss, left   . History of blood transfusion 1956  . Low back pain   . Skin cancer of face     Past Surgical History:  Procedure Laterality Date  . CORONARY ANGIOPLASTY WITH STENT  PLACEMENT  2001; 10/29/2013   PCI of RCA "X 2"; PCI of LAD  . CORONARY ARTERY BYPASS GRAFT N/A 08/17/2014   Procedure: CORONARY ARTERY BYPASS GRAFTING (CABG);  Surgeon: Melrose Nakayama, MD;  Location: [facility];  Service: Open Heart Surgery;  Laterality: N/A;  Times three using left internal mammary artery and endoscopically harvested right saphenous vein  . KNEE ARTHROSCOPY Right 1980's  . KNEE ARTHROSCOPY Left 2007; 2008  . LEFT HEART CATHETERIZATION WITH CORONARY ANGIOGRAM N/A 10/29/2013   Procedure: LEFT HEART CATHETERIZATION WITH CORONARY ANGIOGRAM;  Surgeon: Jettie Booze, MD;  Location: [facility] CATH LAB;  Service: Cardiovascular;  Laterality: N/A;  . LEFT HEART CATHETERIZATION WITH CORONARY ANGIOGRAM N/A 08/14/2014   Procedure: LEFT HEART CATHETERIZATION WITH CORONARY ANGIOGRAM;  Surgeon: Jettie Booze, MD;  Location: [facility] CATH LAB;  Service: Cardiovascular;  Laterality: N/A;  . REFRACTIVE SURGERY Bilateral ?2007  . SKIN CANCER EXCISION Left    "cheek"  . TONSILLECTOMY AND ADENOIDECTOMY  ~ 1956     Prior to Admission medications   Medication Sig Start Date End Date Taking? Authorizing Provider  aspirin 81 MG tablet Take 81 mg by mouth every evening.    Yes [provider]  atorvastatin (LIPITOR) 80 MG tablet Take 1 tablet (80 mg total) by mouth every evening. 01/07/15  Yes Turner, Eber Hong, MD  ezetimibe (ZETIA) 10 MG tablet Take 1 tablet (10 mg total) by mouth daily. 01/07/15  Yes Turner, Eber Hong, MD  nitroGLYCERIN (NITROSTAT) 0.4 MG SL tablet Place 1 tablet (0.4 mg total) under the tongue every 5 (five) minutes as needed for chest pain. 01/07/15  Yes Turner, Eber Hong, MD  Omega-3 Fatty Acids (FISH OIL) 1200 MG CAPS Take 1,200 mg by mouth every morning.   Yes [provider]  vitamin C (ASCORBIC ACID) 500 MG tablet Take 500 mg by mouth every morning.    Yes [provider]  vitamin E 400 UNIT capsule Take 400 Units by mouth daily.   Yes [provider]     Inpatient Medications: Scheduled Meds: . aspirin EC  325 mg Oral Daily  . atorvastatin  80 mg Oral QPM  . enoxaparin (LOVENOX) injection  1 mg/kg Subcutaneous Q12H  . ezetimibe  10 mg Oral Daily   Continuous Infusions:  PRN Meds: acetaminophen, morphine injection, nitroGLYCERIN, ondansetron (ZOFRAN) IV  Allergies:   No Known Allergies  Social History:   Social History   Socioeconomic History  . Marital status: Married    Spouse name: Not on file  . Number of children: Not on file  . Years of education: Not on file  . Highest education level: Not on file  Occupational History  . Not on file  Social Needs  . Financial resource strain: Not on file  . Food insecurity:    Worry: Not on file    Inability: Not on file  . Transportation needs:  Medical: Not on file    Non-medical: Not on file  Tobacco Use  . Smoking status: Never Smoker  . Smokeless tobacco: Never Used  Substance and Sexual Activity  . Alcohol use: Yes    Alcohol/week: 3.0 standard drinks    Types: 3 Cans of beer per week    Comment: 1 beer 3 times weekly  . Drug use: No  . Sexual activity: Yes  Lifestyle  . Physical activity:    Days per week: Not on file    Minutes per session: Not on file  . Stress: Not on file  Relationships  . Social connections:    Talks on phone: Not on file    Gets together: Not on file    Attends religious service: Not on file    Active member of club or organization: Not on file    Attends meetings of clubs or organizations: Not on file    Relationship status: Not on file  . Intimate partner violence:    Fear of current or ex partner: Not on file    Emotionally abused: Not on file    Physically abused: Not on file    Forced sexual activity: Not on file  Other Topics Concern  . Not on file  Social History Narrative  . Not on file    Family History:   Family History  Problem Relation Age of Onset  . Dementia Father    Family Status:  Family Status   Relation Name Status  . Mother  Deceased  . Father  Deceased  . Sister  Alive  . Brother  Alive  . Sister  Alive  . MGM  Deceased  . MGF  Deceased  . PGM  Deceased  . PGF  Deceased    ROS:  Please see the history of present illness.  All other ROS reviewed and negative.     Physical Exam/Data:   Vitals:   07/19/18 1637 07/19/18 1815 07/19/18 1940 07/20/18 0733  BP: (!) 142/77 (!) 146/75 (!) 144/77 106/72  Pulse:   70 60  Resp:  12 16 13   Temp:   98.8 F (37.1 C) 97.9 F (36.6 C)  TempSrc:   Oral Oral  SpO2:   100% 97%  Weight:   66 kg   Height:   5\' 10"  (1.778 m)     Intake/Output Summary (Last 24 hours) at 07/20/2018 1117 Last data filed at 07/19/2018 1940 Gross per 24 hour  Intake 240 ml  Output -  Net 240 ml   Filed Weights   07/19/18 1940  Weight: 66 kg   Body mass index is 20.88 kg/m.   General: Well developed, well nourished, NAD Skin: Warm, dry, intact  Head: Normocephalic, atraumatic, clear, moist mucus membranes. Neck: Negative for carotid bruits. No JVD Lungs:Clear to ausculation bilaterally. No wheezes, rales, or rhonchi. Breathing is unlabored. Cardiovascular: RRR with S1 S2. No murmurs, rubs, gallops, or LV heave appreciated. Abdomen: Soft, non-tender, non-distended with normoactive bowel sounds. No obvious abdominal masses. MSK: Strength and tone appear normal for age. 5/5 in all extremities Extremities: No edema. No clubbing or cyanosis. DP/PT pulses 2+ bilaterally Neuro: Alert and oriented. No focal deficits. No facial asymmetry. MAE spontaneously. Psych: Responds to questions appropriately with normal affect.     EKG:  The EKG was personally reviewed and demonstrates: 07/19/2018 NSR HR 64 Telemetry:  Telemetry was personally reviewed and demonstrates: 07/20/2018 NSR  Relevant CV Studies:  ECHO: Pending results  CATH:  Laboratory Data:  Chemistry Recent Labs  Lab 07/19/18 1641  NA 141  K 4.2  CL 102  CO2 28  GLUCOSE 89   BUN 24*  CREATININE 1.09  CALCIUM 9.7  GFRNONAA >60  GFRAA >60  ANIONGAP 11    Total Protein  Date Value Ref Range Status  01/13/2016 6.9 6.1 - 8.1 g/dL Final   Albumin  Date Value Ref Range Status  01/13/2016 4.7 3.6 - 5.1 g/dL Final   AST  Date Value Ref Range Status  01/13/2016 27 10 - 35 U/L Final   ALT  Date Value Ref Range Status  01/13/2016 31 9 - 46 U/L Final   Alkaline Phosphatase  Date Value Ref Range Status  01/13/2016 87 40 - 115 U/L Final   Total Bilirubin  Date Value Ref Range Status  01/13/2016 1.2 0.2 - 1.2 mg/dL Final   Hematology Recent Labs  Lab 07/19/18 1641  WBC 6.9  RBC 4.67  HGB 14.4  HCT 43.5  MCV 93.1  MCH 30.8  MCHC 33.1  RDW 12.2  PLT 143*   Cardiac Enzymes Recent Labs  Lab 07/19/18 2055 07/20/18 0420  TROPONINI <0.03 <0.03    Recent Labs  Lab 07/19/18 1652  TROPIPOC 0.01    BNPNo results for input(s): BNP, PROBNP in the last 168 hours.  DDimer No results for input(s): DDIMER in the last 168 hours. TSH: No results found for: TSH Lipids: Lab Results  Component Value Date   CHOL 99 (L) 01/13/2016   HDL 55 01/13/2016   LDLCALC 33 01/13/2016   TRIG 57 01/13/2016   CHOLHDL 1.8 01/13/2016   HgbA1c: Lab Results  Component Value Date   HGBA1C 5.6 08/16/2014    Radiology/Studies:  Dg Chest 2 View  Result Date: 07/19/2018 CLINICAL DATA:  Chest pain. EXAM: CHEST - 2 VIEW COMPARISON:  Chest x-ray dated November 17, 2014. FINDINGS: The heart size and mediastinal contours are within normal limits. Prior CABG. Normal pulmonary vascularity. No focal consolidation, pleural effusion, or pneumothorax. No acute osseous abnormality. IMPRESSION: No active cardiopulmonary disease. Electronically Signed   By: Titus Dubin M.D.   On: 07/19/2018 17:09   Assessment and Plan:   1.  Known history of CAD s/p remote PCI to the mid to distal RCA and LAD with recurrent angina with progression of CAD by cath, now post CABG 2016: -He  underwent cardiac cath due to recurrent angina showing moderately diseased left main, moderate disease of the ostial LAD, severe disease of the ostial D1, severe disease of the ostial left circ extending back into the left main and ostial LAD and patent RCA stent. EF was 55%. He was referred to CVTS and underwent CABG with LIMA to LAD, SVG to OM1 and SVG to D2 on 08/17/2014. -Last seen by primary cardiologist 11/30/2016 was doing well -She presented [facility] with exertional chest pain which began several days ago however has progressively worsened.  He has had radiation to his jaw which has been noted to be his anginal equivalent -EKG without acute ischemic changes -Troponin, negative x2 -CXR negative for cardiopulmonary changes -Given his prior history, will consider further work-up with cardiac catheterization versus stress test to evaluate for further progression of CAD -Continue ASA>> no beta-blocker in the setting of intermittent bradycardia -Continue atorvastatin 80 mg  2.  HTN: -Stable, 106/72, 144/77, 146/75  -BP controlled with diet and exercise alone -Consider the addition of beta-blocker however patient has heart rates in the 50s at times  3.  Hyperlipidemia: -Stable, last LDL noted to be 33 on 01/13/2016 -Continue statin and Zetia with LDL goal <70   For questions or updates, please contact [place] Please consult [email or web address] for contact info under Cardiology/STEMI.   Lyndel Safe NP-C [facility] Pager: [phone] 07/20/2018 11:17 AM   Attending Note:   The patient was seen and examined.  Agree with assessment and plan as noted above.  Changes made to the above note as needed.  Patient seen and independently examined with Kathyrn Drown, NP.   We discussed all aspects of the encounter. I agree with the assessment and plan as stated above.  1.  Coronary artery disease: The patient presents with his standard episodes of angina equivalent which is chest pain  with teeth pain.  Has been having these pains for 2 days.  Call the office several days ago and was referred to the emergency room.  He was admitted but we were not called until this morning.  Today, his enzymes are negative and his EKG remains unchanged.  He still has some mild discomfort.  He has a history of stenting in the past and with prior episodes of angina, he is not had any troponin elevations or EKG abnormalities.  He had stress testing that did not show critical coronary artery disease.  I would agree that our best option at this point is to proceed with heart catheterization on Monday.  His wife are both quite anxious about the situation and do not want him to have a heart attack while were waiting.  I reassured him that his troponins look good and his EKG looks good.  He originally was going to leave AMA but I have reassured them that staying here is his best option.  We will start him on IV nitroglycerin and IV heparin.  We will continue to cycle enzymes. Starts to have any increase in his troponins then will consider taking him for heart catheterization.  Otherwise we have got him on the schedule for Monday. Discussed the risks, benefits, options of heart catheterization.  He understands and agrees to proceed.   2.  Dyslipidemia: His last lipid levels were drawn in June 2017.  His total cholesterol is 9 9.  HDL is 55.  LDL is 33.  Triglyceride level is 57.  We will check a fasting lipid profile tomorrow morning.   I have spent a total of 40 minutes with patient reviewing hospital  notes , telemetry, EKGs, labs and examining patient as well as establishing an assessment and plan that was discussed with the patient. > 50% of time was spent in direct patient care.    [name] Headings, Brooke Bonito., MD, [facility] 07/20/2018, 3:03 PM [address],  [place] Pager [phone]

## 2018-07-21 DIAGNOSIS — R079 Chest pain, unspecified: Secondary | ICD-10-CM

## 2018-07-21 LAB — CBC
HCT: 39.9 % (ref 39.0–52.0)
Hemoglobin: 13.4 g/dL (ref 13.0–17.0)
MCH: 30.9 pg (ref 26.0–34.0)
MCHC: 33.6 g/dL (ref 30.0–36.0)
MCV: 92.1 fL (ref 80.0–100.0)
PLATELETS: 144 10*3/uL — AB (ref 150–400)
RBC: 4.33 MIL/uL (ref 4.22–5.81)
RDW: 12.3 % (ref 11.5–15.5)
WBC: 5.5 10*3/uL (ref 4.0–10.5)
nRBC: 0 % (ref 0.0–0.2)

## 2018-07-21 LAB — HEPARIN LEVEL (UNFRACTIONATED)
HEPARIN UNFRACTIONATED: 0.92 [IU]/mL — AB (ref 0.30–0.70)
Heparin Unfractionated: 0.86 IU/mL — ABNORMAL HIGH (ref 0.30–0.70)
Heparin Unfractionated: 0.43 IU/mL (ref 0.30–0.70)

## 2018-07-21 LAB — TROPONIN I: Troponin I: <0.03 ng/mL (ref <0.03)

## 2018-07-21 MED ORDER — SODIUM CHLORIDE 0.9 % WEIGHT BASED INFUSION
3.0000 mL/kg/h | INTRAVENOUS | Status: DC
  Administered 2018-07-22: 3 mL/kg/h via INTRAVENOUS

## 2018-07-21 MED ORDER — ASPIRIN 81 MG PO CHEW
81.0000 mg | CHEWABLE_TABLET | ORAL | Status: AC
  Administered 2018-07-22: 81 mg via ORAL
  Filled 2018-07-21: qty 1

## 2018-07-21 MED ORDER — SODIUM CHLORIDE 0.9 % IV SOLN: 250.0000 mL | INTRAVENOUS | Status: DC | PRN

## 2018-07-21 MED ORDER — SODIUM CHLORIDE 0.9 % WEIGHT BASED INFUSION
1.0000 mL/kg/h | INTRAVENOUS | Status: DC
  Administered 2018-07-22: 1 mL/kg/h via INTRAVENOUS

## 2018-07-21 NOTE — Progress Notes (Signed)
[place] for heparin  Indication: NSTEMI / ACS  No Known Allergies  Patient Measurements: TBW 66 kg Heparin Dosing Weight: 66kg   Vital Signs: Temp: 98.1 F (36.7 C) (12/14 1955) Temp Source: Oral (12/14 1955) BP: 98/66 (12/14 1955) Pulse Rate: 64 (12/14 1955)  Labs: Recent Labs    07/19/18 1641 07/19/18 2055 07/20/18 0420 07/21/18 0056  HGB 14.4  --   --  13.4  HCT 43.5  --   --  39.9  PLT 143*  --   --  144*  HEPARINUNFRC  --   --   --  0.86*  CREATININE 1.09  --   --   --   TROPONINI  --  <0.03 <0.03  --     Estimated Creatinine Clearance: 60.6 mL/min (by C-G formula based on SCr of 1.09 mg/dL).  Assessment: 67 yo M presents with CP. Pharmacy orginally consulted for Lovenox but now patient is being change to heparin in anticipation of Cath.   Hgb and plts ok. No bleeding noted. Last dose of enoxaparin given at 1134 12/14. Initial heparin level 0.86 units/ml  Goal of Therapy: Heparin level 0.3-0.8 units/ml  Monitor platelets by anticoagulation protocol: Yes   Plan:  Decrease heparin to 700 units/hr Monitor anti-Xa level 6hrs then daily and CBC daily.  Monitor for s/sx of bleeding.   Thanks for allowing pharmacy to be a part of this patient's care.  Excell Seltzer, PharmD Clinical Pharmacist

## 2018-07-21 NOTE — Progress Notes (Signed)
ANTICOAGULATION CONSULT NOTE   Pharmacy Consult for heparin  Indication: NSTEMI / ACS  No Known Allergies  Patient Measurements: TBW 66 kg Heparin Dosing Weight: 66kg   Vital Signs: Temp: 97.9 F (36.6 C) (12/15 1317) Temp Source: Oral (12/15 1317) BP: 111/67 (12/15 1317) Pulse Rate: 68 (12/15 1317)  Labs: Recent Labs    07/19/18 1641 07/19/18 2055 07/20/18 0420 07/21/18 0056 07/21/18 0901 07/21/18 1510  HGB 14.4  --   --  13.4  --   --   HCT 43.5  --   --  39.9  --   --   PLT 143*  --   --  144*  --   --   HEPARINUNFRC  --   --   --  0.86* 0.92* 0.43  CREATININE 1.09  --   --   --   --   --   TROPONINI  --  <0.03 <0.03  --  <0.03  --     Estimated Creatinine Clearance: 58.5 mL/min (by C-G formula based on SCr of 1.09 mg/dL).  Assessment: 67 yo M presents with CP. Pharmacy orginally consulted for Lovenox but patient transitions to heparin in anticipation of Cath. Last dose of enoxaparin given at 1134 12/14.  Hgb and plts stable. No bleeding noted and confirmed with nursing.  Heparin level now therapeutic  Goal of Therapy: Heparin level 0.3-0.7 units/ml  Monitor platelets by anticoagulation protocol: Yes   Plan:  Continue heparin at 600 units / hr Follow up AM labs Monitor for s/sx of bleeding.    Thank you Anette Guarneri, PharmD [phone] 07/21/2018      3:55 PM

## 2018-07-21 NOTE — Progress Notes (Signed)
[place] for heparin  Indication: NSTEMI / ACS  No Known Allergies  Patient Measurements: TBW 66 kg Heparin Dosing Weight: 66kg   Vital Signs: Temp: 98 F (36.7 C) (12/15 0900) Temp Source: Oral (12/15 0652) BP: 119/61 (12/15 0900) Pulse Rate: 64 (12/15 0900)  Labs: Recent Labs    07/19/18 1641 07/19/18 2055 07/20/18 0420 07/21/18 0056 07/21/18 0901  HGB 14.4  --   --  13.4  --   HCT 43.5  --   --  39.9  --   PLT 143*  --   --  144*  --   HEPARINUNFRC  --   --   --  0.86* 0.92*  CREATININE 1.09  --   --   --   --   TROPONINI  --  <0.03 <0.03  --   --     Estimated Creatinine Clearance: 58.5 mL/min (by C-G formula based on SCr of 1.09 mg/dL).  Assessment: 67 yo M presents with CP. Pharmacy orginally consulted for Lovenox but patient transitions to heparin in anticipation of Cath. Last dose of enoxaparin given at 1134 12/14.  Hgb and plts stable. No bleeding noted and confirmed with nursing.  Heparin level supratherapeutic at 0.92 units/ml  Goal of Therapy: Heparin level 0.3-0.7 units/ml  Monitor platelets by anticoagulation protocol: Yes   Plan:  Decrease heparin to 600 units/hr Monitor anti-Xa level 6hrs then daily and CBC daily.  Monitor for s/sx of bleeding.   Thanks for allowing pharmacy to be a part of this patient's care.  Azzie Roup D PGY1 Pharmacy Resident  Phone [phone] Please use AMION for clinical pharmacists numbers  07/21/2018      9:40 AM

## 2018-07-21 NOTE — H&P (View-Only) (Signed)
Progress Note  Patient Name: Michael Herrera Date of Encounter: 07/21/2018  Primary Cardiologist: Fransico Him, MD   Subjective   67 year old gentleman with a history of coronary artery disease-status post coronary artery bypass grafting.  He presented to the hospital several days ago with episodes of chest pressure and teeth pain.  This apparently is his anginal equivalent.  EKG remains unremarkable.  Troponin levels remain normal.  Inpatient Medications    Scheduled Meds: . aspirin EC  325 mg Oral Daily  . atorvastatin  80 mg Oral QPM  . ezetimibe  10 mg Oral Daily   Continuous Infusions: . heparin 600 Units/hr (07/21/18 0954)  . nitroGLYCERIN 10 mcg/min (07/20/18 1343)   PRN Meds: acetaminophen, morphine injection, nitroGLYCERIN, ondansetron (ZOFRAN) IV   Vital Signs    Vitals:   07/20/18 1627 07/20/18 1955 07/21/18 0652 07/21/18 0900  BP: 108/71 98/66 102/66 119/61  Pulse:  64 (!) 58 64  Resp: 16 16 16 16   Temp:  98.1 F (36.7 C) 98 F (36.7 C) 98 F (36.7 C)  TempSrc:  Oral Oral   SpO2: 99% 97% 99%   Weight:   63.8 kg   Height:        Intake/Output Summary (Last 24 hours) at 07/21/2018 1008 Last data filed at 07/21/2018 0500 Gross per 24 hour  Intake 361.04 ml  Output -  Net 361.04 ml   Filed Weights   07/19/18 1940 07/21/18 0652  Weight: 66 kg 63.8 kg    Telemetry     NSR - Personally Reviewed  ECG     NSR  - Personally Reviewed  Physical Exam   GEN: No acute distress.   Neck: No JVD Cardiac: RRR, no murmurs, rubs, or gallops.  Respiratory: Clear to auscultation bilaterally. GI: Soft, nontender, non-distended  MS: No edema; No deformity. Neuro:  Nonfocal  Psych: Normal affect   Labs    Chemistry Recent Labs  Lab 07/19/18 1641  NA 141  K 4.2  CL 102  CO2 28  GLUCOSE 89  BUN 24*  CREATININE 1.09  CALCIUM 9.7  GFRNONAA >60  GFRAA >60  ANIONGAP 11     Hematology Recent Labs  Lab 07/19/18 1641 07/21/18 0056  WBC  6.9 5.5  RBC 4.67 4.33  HGB 14.4 13.4  HCT 43.5 39.9  MCV 93.1 92.1  MCH 30.8 30.9  MCHC 33.1 33.6  RDW 12.2 12.3  PLT 143* 144*    Cardiac Enzymes Recent Labs  Lab 07/19/18 2055 07/20/18 0420 07/21/18 0901  TROPONINI <0.03 <0.03 <0.03    Recent Labs  Lab 07/19/18 1652  TROPIPOC 0.01     BNPNo results for input(s): BNP, PROBNP in the last 168 hours.   DDimer No results for input(s): DDIMER in the last 168 hours.   Radiology    Dg Chest 2 View  Result Date: 07/19/2018 CLINICAL DATA:  Chest pain. EXAM: CHEST - 2 VIEW COMPARISON:  Chest x-ray dated November 17, 2014. FINDINGS: The heart size and mediastinal contours are within normal limits. Prior CABG. Normal pulmonary vascularity. No focal consolidation, pleural effusion, or pneumothorax. No acute osseous abnormality. IMPRESSION: No active cardiopulmonary disease. Electronically Signed   By: Titus Dubin M.D.   On: 07/19/2018 17:09    Cardiac Studies     Patient Profile     67 y.o. male known coronary artery disease.  He presented with his unstable angina and is scheduled for heart cath tomorrow.  Assessment & Plan  1.  Coronary artery disease: The patient did with his typical angina.  He has chest pressure and back pressure as well as teeth pain.  This is his classic angina.  Myoview studies have not been helpful in the past.  He now presents with several days of chest discomfort/tooth pain. Symptoms have improved on IV heparin and IV nitroglycerin.  Troponins remain negative.  EKG remains unchanged.  He is scheduled for heart catheterization tomorrow.  We have discussed the risks, benefits, options of heart catheterization.  He understands and agrees to proceed.      For questions or updates, please contact [place] Please consult [email or web address] for contact info under        Signed, Mertie Moores, MD  07/21/2018, 10:08 AM

## 2018-07-21 NOTE — Progress Notes (Signed)
PROGRESS NOTE  [name] PPI:[id number] DOB: 16-Feb-1951 DOA: 07/19/2018 PCP: Orpah Melter, MD  HPI/Brief Narrative  Michael Herrera is a 67 y.o. year old male with medical history significant for  CAD s/p CABG ( 06/2015) and PCI of mid to distal RCA and LAD(2015),HTN,  HLD, GERD who presented on 07/19/2018 with chest pain and tooth pain.  Patient has excellent exercise tolerance at baseline typically walks 2 miles a day exercises 3 times a day with intensive cardio.  States 2 days prior to admission noticed dull chest pain with radiation to back and tooth/jaw similar to his prior chest pain from 2016 and 2015 when he required a CABG and stent respectively).  On yesterday patient took nitro with some relief of chest pain but again notice worsening chest pain while either walking uphill during his walks or even going up the stairs in his house.  He called his cardiologist who recommended evaluation in the ED.  In the ED his EKG was nonischemic, troponin trend has been negative, patient was admitted for observation given previous history of tooth pain/jaw pain is anginal equivalent.  ED physician spoke with Dr. Radford Pax.  Subjective No chest pain, no shortness of breath.  No acute complaints.  Assessment/Plan:  #Chest pain on exertion consistent with stable angina.  Has history of previous stent and CABG in 2015 2016.  With previous history of tooth/jaw pain is anginal equivalent.  Currently not having any chest pain or tooth/jaw pain.  Doing well on nitro drip and heparin infusion.  He shows no regional wall motion abnormalities, troponin trend negative, nonischemic EKG.  Cardiology planning for cath on 12/16.  #HTN, at goal currently controlled on diet and exercise.   #HLD, continue home  lipitor and Zetia (already high intensity)  #History of drug induced bradycardia  Code Status: Full code  Family Communication: No family at bedside  Disposition Plan: N.p.o. at midnight for planned  cath on 12/16   Consultants:  Cardiology     Procedures:  None  Antimicrobials: Anti-infectives (From admission, onward)   None        Cultures:  None  Telemetry: Yes  DVT prophylaxis: Lovenox   Objective: Vitals:   07/20/18 1955 07/21/18 0652 07/21/18 0900 07/21/18 1317  BP: 98/66 102/66 119/61 111/67  Pulse: 64 (!) 58 64 68  Resp: 16 16 16    Temp: 98.1 F (36.7 C) 98 F (36.7 C) 98 F (36.7 C) 97.9 F (36.6 C)  TempSrc: Oral Oral  Oral  SpO2: 97% 99%  100%  Weight:  63.8 kg    Height:        Intake/Output Summary (Last 24 hours) at 07/21/2018 1828 Last data filed at 07/21/2018 1500 Gross per 24 hour  Intake 690.5 ml  Output 3 ml  Net 687.5 ml   Filed Weights   07/19/18 1940 07/21/18 0652  Weight: 66 kg 63.8 kg    Exam:  Constitutional:normal appearing male Eyes: EOMI, anicteric, normal conjunctivae ENMT: Oropharynx with moist mucous membranes, normal dentition Cardiovascular: RRR no MRGs, with no peripheral edema, no chest wall tenderness Respiratory: Normal respiratory effort, clear breath sounds  Abdomen: Soft,non-tender, with no HSM Skin: No rash ulcers, or lesions. Without skin tenting  Neurologic: Grossly no focal neuro deficit. Psychiatric:Appropriate affect, and mood. Mental status AAOx3  Data Reviewed: CBC: Recent Labs  Lab 07/19/18 1641 07/21/18 0056  WBC 6.9 5.5  HGB 14.4 13.4  HCT 43.5 39.9  MCV 93.1 92.1  PLT 143* 144*  Basic Metabolic Panel: Recent Labs  Lab 07/19/18 1641  NA 141  K 4.2  CL 102  CO2 28  GLUCOSE 89  BUN 24*  CREATININE 1.09  CALCIUM 9.7   GFR: Estimated Creatinine Clearance: 58.5 mL/min (by C-G formula based on SCr of 1.09 mg/dL). Liver Function Tests: No results for input(s): AST, ALT, ALKPHOS, BILITOT, PROT, ALBUMIN in the last 168 hours. No results for input(s): LIPASE, AMYLASE in the last 168 hours. No results for input(s): AMMONIA in the last 168 hours. Coagulation Profile: No  results for input(s): INR, PROTIME in the last 168 hours. Cardiac Enzymes: Recent Labs  Lab 07/19/18 2055 07/20/18 0420 07/21/18 0901  TROPONINI <0.03 <0.03 <0.03   BNP (last 3 results) No results for input(s): PROBNP in the last 8760 hours. HbA1C: No results for input(s): HGBA1C in the last 72 hours. CBG: No results for input(s): GLUCAP in the last 168 hours. Lipid Profile: Recent Labs    07/20/18 0420  CHOL 97  HDL 44  LDLCALC 41  TRIG 62  CHOLHDL 2.2   Thyroid Function Tests: No results for input(s): TSH, T4TOTAL, FREET4, T3FREE, THYROIDAB in the last 72 hours. Anemia Panel: No results for input(s): VITAMINB12, FOLATE, FERRITIN, TIBC, IRON, RETICCTPCT in the last 72 hours. Urine analysis:    Component Value Date/Time   COLORURINE YELLOW 08/16/2014 1846   APPEARANCEUR CLEAR 08/16/2014 1846   LABSPEC 1.013 08/16/2014 1846   PHURINE 5.5 08/16/2014 1846   GLUCOSEU NEGATIVE 08/16/2014 1846   HGBUR NEGATIVE 08/16/2014 1846   BILIRUBINUR NEGATIVE 08/16/2014 1846   KETONESUR NEGATIVE 08/16/2014 1846   PROTEINUR NEGATIVE 08/16/2014 1846   UROBILINOGEN 1.0 08/16/2014 1846   NITRITE NEGATIVE 08/16/2014 1846   LEUKOCYTESUR NEGATIVE 08/16/2014 1846   Sepsis Labs: @LABRCNTIP (procalcitonin:4,lacticidven:4)  ) Recent Results (from the past 240 hour(s))  MRSA PCR Screening     Status: None   Collection Time: 07/19/18  7:35 PM  Result Value Ref Range Status   MRSA by PCR NEGATIVE NEGATIVE Final    Comment:        The GeneXpert MRSA Assay (FDA approved for NASAL specimens only), is one component of a comprehensive MRSA colonization surveillance program. It is not intended to diagnose MRSA infection nor to guide or monitor treatment for MRSA infections. Performed at [facility], [place] [address], [place], [place] [address]       Studies: No results found.  Scheduled Meds: . [START ON 07/22/2018] aspirin  81 mg Oral Pre-Cath  . aspirin EC  325 mg Oral  Daily  . atorvastatin  80 mg Oral QPM  . ezetimibe  10 mg Oral Daily    Continuous Infusions: . sodium chloride    . [START ON 07/22/2018] sodium chloride     Followed by  . [START ON 07/22/2018] sodium chloride    . heparin 600 Units/hr (07/21/18 0954)  . nitroGLYCERIN 10 mcg/min (07/20/18 1343)     LOS: 0 days     Desiree Hane, MD [facility] Pager [phone]  If 7PM-7AM, please contact night-coverage [email or web address] Password [email or web address] 07/21/2018, 6:28 PM

## 2018-07-21 NOTE — Progress Notes (Signed)
Progress Note  Patient Name: Michael Herrera Date of Encounter: 07/21/2018  Primary Cardiologist: Fransico Him, MD   Subjective   67 year old gentleman with a history of coronary artery disease-status post coronary artery bypass grafting.  He presented to the hospital several days ago with episodes of chest pressure and teeth pain.  This apparently is his anginal equivalent.  EKG remains unremarkable.  Troponin levels remain normal.  Inpatient Medications    Scheduled Meds: . aspirin EC  325 mg Oral Daily  . atorvastatin  80 mg Oral QPM  . ezetimibe  10 mg Oral Daily   Continuous Infusions: . heparin 600 Units/hr (07/21/18 0954)  . nitroGLYCERIN 10 mcg/min (07/20/18 1343)   PRN Meds: acetaminophen, morphine injection, nitroGLYCERIN, ondansetron (ZOFRAN) IV   Vital Signs    Vitals:   07/20/18 1627 07/20/18 1955 07/21/18 0652 07/21/18 0900  BP: 108/71 98/66 102/66 119/61  Pulse:  64 (!) 58 64  Resp: 16 16 16 16   Temp:  98.1 F (36.7 C) 98 F (36.7 C) 98 F (36.7 C)  TempSrc:  Oral Oral   SpO2: 99% 97% 99%   Weight:   63.8 kg   Height:        Intake/Output Summary (Last 24 hours) at 07/21/2018 1008 Last data filed at 07/21/2018 0500 Gross per 24 hour  Intake 361.04 ml  Output -  Net 361.04 ml   Filed Weights   07/19/18 1940 07/21/18 0652  Weight: 66 kg 63.8 kg    Telemetry     NSR - Personally Reviewed  ECG     NSR  - Personally Reviewed  Physical Exam   GEN: No acute distress.   Neck: No JVD Cardiac: RRR, no murmurs, rubs, or gallops.  Respiratory: Clear to auscultation bilaterally. GI: Soft, nontender, non-distended  MS: No edema; No deformity. Neuro:  Nonfocal  Psych: Normal affect   Labs    Chemistry Recent Labs  Lab 07/19/18 1641  NA 141  K 4.2  CL 102  CO2 28  GLUCOSE 89  BUN 24*  CREATININE 1.09  CALCIUM 9.7  GFRNONAA >60  GFRAA >60  ANIONGAP 11     Hematology Recent Labs  Lab 07/19/18 1641 07/21/18 0056  WBC  6.9 5.5  RBC 4.67 4.33  HGB 14.4 13.4  HCT 43.5 39.9  MCV 93.1 92.1  MCH 30.8 30.9  MCHC 33.1 33.6  RDW 12.2 12.3  PLT 143* 144*    Cardiac Enzymes Recent Labs  Lab 07/19/18 2055 07/20/18 0420 07/21/18 0901  TROPONINI <0.03 <0.03 <0.03    Recent Labs  Lab 07/19/18 1652  TROPIPOC 0.01     BNPNo results for input(s): BNP, PROBNP in the last 168 hours.   DDimer No results for input(s): DDIMER in the last 168 hours.   Radiology    Dg Chest 2 View  Result Date: 07/19/2018 CLINICAL DATA:  Chest pain. EXAM: CHEST - 2 VIEW COMPARISON:  Chest x-ray dated November 17, 2014. FINDINGS: The heart size and mediastinal contours are within normal limits. Prior CABG. Normal pulmonary vascularity. No focal consolidation, pleural effusion, or pneumothorax. No acute osseous abnormality. IMPRESSION: No active cardiopulmonary disease. Electronically Signed   By: Titus Dubin M.D.   On: 07/19/2018 17:09    Cardiac Studies     Patient Profile     67 y.o. male known coronary artery disease.  He presented with his unstable angina and is scheduled for heart cath tomorrow.  Assessment & Plan  1.  Coronary artery disease: The patient did with his typical angina.  He has chest pressure and back pressure as well as teeth pain.  This is his classic angina.  Myoview studies have not been helpful in the past.  He now presents with several days of chest discomfort/tooth pain. Symptoms have improved on IV heparin and IV nitroglycerin.  Troponins remain negative.  EKG remains unchanged.  He is scheduled for heart catheterization tomorrow.  We have discussed the risks, benefits, options of heart catheterization.  He understands and agrees to proceed.      For questions or updates, please contact [place] Please consult [email or web address] for contact info under        Signed, Mertie Moores, MD  07/21/2018, 10:08 AM

## 2018-07-22 ENCOUNTER — Encounter (HOSPITAL_COMMUNITY)
Admission: EM | Disposition: A | Discharge status: Home / Self Care | Payer: Self-pay | Source: Home / Self Care | Attending: Internal Medicine

## 2018-07-22 DIAGNOSIS — Z955 Presence of coronary angioplasty implant and graft: Secondary | ICD-10-CM | POA: Diagnosis not present

## 2018-07-22 DIAGNOSIS — I213 ST elevation (STEMI) myocardial infarction of unspecified site: Secondary | ICD-10-CM | POA: Diagnosis present

## 2018-07-22 DIAGNOSIS — R079 Chest pain, unspecified: Secondary | ICD-10-CM | POA: Diagnosis present

## 2018-07-22 DIAGNOSIS — E785 Hyperlipidemia, unspecified: Secondary | ICD-10-CM | POA: Diagnosis present

## 2018-07-22 DIAGNOSIS — I25118 Atherosclerotic heart disease of native coronary artery with other forms of angina pectoris: Secondary | ICD-10-CM

## 2018-07-22 DIAGNOSIS — Z85828 Personal history of other malignant neoplasm of skin: Secondary | ICD-10-CM | POA: Diagnosis not present

## 2018-07-22 DIAGNOSIS — K219 Gastro-esophageal reflux disease without esophagitis: Secondary | ICD-10-CM | POA: Diagnosis present

## 2018-07-22 DIAGNOSIS — R001 Bradycardia, unspecified: Secondary | ICD-10-CM | POA: Diagnosis not present

## 2018-07-22 DIAGNOSIS — I10 Essential (primary) hypertension: Secondary | ICD-10-CM | POA: Diagnosis present

## 2018-07-22 DIAGNOSIS — M545 Low back pain: Secondary | ICD-10-CM | POA: Diagnosis present

## 2018-07-22 DIAGNOSIS — R0789 Other chest pain: Secondary | ICD-10-CM | POA: Diagnosis not present

## 2018-07-22 DIAGNOSIS — Z951 Presence of aortocoronary bypass graft: Secondary | ICD-10-CM | POA: Diagnosis not present

## 2018-07-22 DIAGNOSIS — I25119 Atherosclerotic heart disease of native coronary artery with unspecified angina pectoris: Secondary | ICD-10-CM | POA: Diagnosis not present

## 2018-07-22 DIAGNOSIS — Z7982 Long term (current) use of aspirin: Secondary | ICD-10-CM | POA: Diagnosis not present

## 2018-07-22 HISTORY — PX: CORONARY STENT INTERVENTION: CATH118234

## 2018-07-22 HISTORY — PX: LEFT HEART CATH AND CORS/GRAFTS ANGIOGRAPHY: CATH118250

## 2018-07-22 LAB — CBC
HCT: 40.5 % (ref 39.0–52.0)
Hemoglobin: 13.6 g/dL (ref 13.0–17.0)
MCH: 30.8 pg (ref 26.0–34.0)
MCHC: 33.6 g/dL (ref 30.0–36.0)
MCV: 91.6 fL (ref 80.0–100.0)
Platelets: 149 10*3/uL — ABNORMAL LOW (ref 150–400)
RBC: 4.42 MIL/uL (ref 4.22–5.81)
RDW: 12.3 % (ref 11.5–15.5)
WBC: 5.1 10*3/uL (ref 4.0–10.5)
nRBC: 0 % (ref 0.0–0.2)

## 2018-07-22 LAB — HEPARIN LEVEL (UNFRACTIONATED): Heparin Unfractionated: 0.19 IU/mL — ABNORMAL LOW (ref 0.30–0.70)

## 2018-07-22 LAB — POCT ACTIVATED CLOTTING TIME: Activated Clotting Time: 378 seconds

## 2018-07-22 SURGERY — LEFT HEART CATH AND CORS/GRAFTS ANGIOGRAPHY
Anesthesia: LOCAL

## 2018-07-22 MED ORDER — SODIUM CHLORIDE 0.9% FLUSH: 3.0000 mL | INTRAVENOUS | Status: DC | PRN

## 2018-07-22 MED ORDER — BIVALIRUDIN TRIFLUOROACETATE 250 MG IV SOLR
INTRAVENOUS | Status: AC
  Filled 2018-07-22: qty 250

## 2018-07-22 MED ORDER — SODIUM CHLORIDE 0.9 % IV SOLN
INTRAVENOUS | Status: AC | PRN
  Administered 2018-07-22: 1.75 mg/kg/h via INTRAVENOUS

## 2018-07-22 MED ORDER — FENTANYL CITRATE (PF) 100 MCG/2ML IJ SOLN
INTRAMUSCULAR | Status: DC | PRN
  Administered 2018-07-22: 25 ug via INTRAVENOUS

## 2018-07-22 MED ORDER — LIDOCAINE HCL (PF) 1 % IJ SOLN
INTRAMUSCULAR | Status: AC
  Filled 2018-07-22: qty 30

## 2018-07-22 MED ORDER — MORPHINE SULFATE (PF) 2 MG/ML IV SOLN: 2.0000 mg | INTRAVENOUS | Status: DC | PRN

## 2018-07-22 MED ORDER — MIDAZOLAM HCL 2 MG/2ML IJ SOLN
INTRAMUSCULAR | Status: DC | PRN
  Administered 2018-07-22: 1 mg via INTRAVENOUS

## 2018-07-22 MED ORDER — ANGIOPLASTY BOOK
Freq: Once | Status: AC
  Administered 2018-07-22: 21:00:00
  Filled 2018-07-22: qty 1

## 2018-07-22 MED ORDER — LABETALOL HCL 5 MG/ML IV SOLN: 10.0000 mg | INTRAVENOUS | Status: AC | PRN

## 2018-07-22 MED ORDER — IOHEXOL 350 MG/ML SOLN
INTRAVENOUS | Status: DC | PRN
  Administered 2018-07-22: 200 mL via INTRA_ARTERIAL

## 2018-07-22 MED ORDER — CLOPIDOGREL BISULFATE 300 MG PO TABS
ORAL_TABLET | ORAL | Status: DC | PRN
  Administered 2018-07-22: 600 mg via ORAL

## 2018-07-22 MED ORDER — ONDANSETRON HCL 4 MG/2ML IJ SOLN: 4.0000 mg | Freq: Four times a day (QID) | INTRAMUSCULAR | Status: DC | PRN

## 2018-07-22 MED ORDER — SODIUM CHLORIDE 0.9 % IV SOLN
INTRAVENOUS | Status: AC
  Administered 2018-07-22: 18:00:00 via INTRAVENOUS

## 2018-07-22 MED ORDER — LIDOCAINE HCL (PF) 1 % IJ SOLN
INTRAMUSCULAR | Status: DC | PRN
  Administered 2018-07-22: 15 mL via SUBCUTANEOUS

## 2018-07-22 MED ORDER — SODIUM CHLORIDE 0.9 % IV SOLN
1.7500 mg/kg/h | INTRAVENOUS | Status: AC
  Administered 2018-07-22 (×2): 1.75 mg/kg/h via INTRAVENOUS
  Filled 2018-07-22 (×2): qty 250

## 2018-07-22 MED ORDER — ACETAMINOPHEN 325 MG PO TABS: 650.0000 mg | ORAL_TABLET | ORAL | Status: DC | PRN

## 2018-07-22 MED ORDER — HEPARIN (PORCINE) IN NACL 1000-0.9 UT/500ML-% IV SOLN
INTRAVENOUS | Status: DC | PRN
  Administered 2018-07-22 (×3): 500 mL

## 2018-07-22 MED ORDER — SODIUM CHLORIDE 0.9 % IV SOLN: 250.0000 mL | INTRAVENOUS | Status: DC | PRN

## 2018-07-22 MED ORDER — FAMOTIDINE IN NACL 20-0.9 MG/50ML-% IV SOLN
INTRAVENOUS | Status: AC | PRN
  Administered 2018-07-22: 20 mg via INTRAVENOUS

## 2018-07-22 MED ORDER — BIVALIRUDIN BOLUS VIA INFUSION - CUPID
INTRAVENOUS | Status: DC | PRN
  Administered 2018-07-22: 48.075 mg via INTRAVENOUS

## 2018-07-22 MED ORDER — FAMOTIDINE IN NACL 20-0.9 MG/50ML-% IV SOLN
INTRAVENOUS | Status: AC
  Filled 2018-07-22: qty 50

## 2018-07-22 MED ORDER — MIDAZOLAM HCL 2 MG/2ML IJ SOLN
INTRAMUSCULAR | Status: AC
  Filled 2018-07-22: qty 2

## 2018-07-22 MED ORDER — HEPARIN (PORCINE) IN NACL 1000-0.9 UT/500ML-% IV SOLN
INTRAVENOUS | Status: AC
  Filled 2018-07-22: qty 1000

## 2018-07-22 MED ORDER — HYDRALAZINE HCL 20 MG/ML IJ SOLN: 5.0000 mg | INTRAMUSCULAR | Status: AC | PRN

## 2018-07-22 MED ORDER — CLOPIDOGREL BISULFATE 75 MG PO TABS
75.0000 mg | ORAL_TABLET | Freq: Every day | ORAL | Status: DC
  Administered 2018-07-23: 75 mg via ORAL
  Filled 2018-07-22: qty 1

## 2018-07-22 MED ORDER — THE SENSUOUS HEART BOOK
Freq: Once | Status: AC
  Administered 2018-07-22: 21:00:00
  Filled 2018-07-22: qty 1

## 2018-07-22 MED ORDER — CLOPIDOGREL BISULFATE 300 MG PO TABS
ORAL_TABLET | ORAL | Status: AC
  Filled 2018-07-22: qty 2

## 2018-07-22 MED ORDER — FENTANYL CITRATE (PF) 100 MCG/2ML IJ SOLN
INTRAMUSCULAR | Status: AC
  Filled 2018-07-22: qty 2

## 2018-07-22 MED ORDER — SODIUM CHLORIDE 0.9% FLUSH
3.0000 mL | Freq: Two times a day (BID) | INTRAVENOUS | Status: DC
  Administered 2018-07-22: 3 mL via INTRAVENOUS

## 2018-07-22 MED ORDER — NITROGLYCERIN 1 MG/10 ML FOR IR/CATH LAB
INTRA_ARTERIAL | Status: AC
  Filled 2018-07-22: qty 10

## 2018-07-22 MED ORDER — ASPIRIN 81 MG PO CHEW: 81.0000 mg | CHEWABLE_TABLET | Freq: Every day | ORAL | Status: DC

## 2018-07-22 SURGICAL SUPPLY — 17 items
BALLN SAPPHIRE 2.5X12 (BALLOONS) ×2
BALLN SAPPHIRE ~~LOC~~ 3.25X12 (BALLOONS) ×2 IMPLANT
BALLOON SAPPHIRE 2.5X12 (BALLOONS) IMPLANT
CATH INFINITI 5FR MULTPACK ANG (CATHETERS) ×1 IMPLANT
CATH VISTA GUIDE 6FR XBLAD3.5 (CATHETERS) ×1 IMPLANT
KIT ENCORE 26 ADVANTAGE (KITS) ×2 IMPLANT
KIT HEART LEFT (KITS) ×1 IMPLANT
PACK CARDIAC CATHETERIZATION (CUSTOM PROCEDURE TRAY) ×2 IMPLANT
SHEATH PINNACLE 5F 10CM (SHEATH) ×2 IMPLANT
SHEATH PINNACLE 6F 10CM (SHEATH) ×1 IMPLANT
STENT SYNERGY DES 3X12 (Permanent Stent) ×1 IMPLANT
STENT SYNERGY DES 3X16 (Permanent Stent) ×1 IMPLANT
SYR MEDRAD MARK 7 150ML (SYRINGE) ×2 IMPLANT
TRANSDUCER W/STOPCOCK (MISCELLANEOUS) ×2 IMPLANT
TUBING CIL FLEX 10 FLL-RA (TUBING) ×2 IMPLANT
WIRE EMERALD 3MM-J .035X150CM (WIRE) ×1 IMPLANT
WIRE SION BLUE 180 (WIRE) ×1 IMPLANT

## 2018-07-22 NOTE — Progress Notes (Signed)
PROGRESS NOTE  Michael Herrera:[id number] DOB: 1951/05/28 DOA: 07/19/2018 PCP: Orpah Melter, MD  HPI/Brief Narrative  Michael Herrera is a 67 y.o. year old male with medical history significant for  CAD s/p CABG ( 06/2015) and PCI of mid to distal RCA and LAD(2015),HTN,  HLD, GERD who presented on 07/19/2018 with chest pain and tooth pain.  Patient has excellent exercise tolerance at baseline typically walks 2 miles a day exercises 3 times a day with intensive cardio.  States 2 days prior to admission noticed dull chest pain with radiation to back and tooth/jaw similar to his prior chest pain from 2016 and 2015 when he required a CABG and stent respectively).  On yesterday patient took nitro with some relief of chest pain but again notice worsening chest pain while either walking uphill during his walks or even going up the stairs in his house.  He called his cardiologist who recommended evaluation in the ED.  In the ED his EKG was nonischemic, troponin trend has been negative, patient was admitted for observation given previous history of tooth pain/jaw pain is anginal equivalent.  ED physician spoke with Dr. Radford Pax.  Subjective Continues to report no chest pain, no shortness of breath, no jaw/tooth pain..  Assessment/Plan:  #Chest pain on exertion consistent with stable angina.  Has history of previous stent and CABG in 2015 2016.  With previous history of tooth/jaw pain is anginal equivalent.  Underwent left heart cath on 12/16 which showed LIMA-LAD that was atretic and high-grade ostial LAD lesion which was treated with PCI, there was also distal RCA lesion which cardiology recommends medical treatment with dual antiplatelet therapy: Aspirin and Plavix.  On bivalirudin post-cath per cardiology   #HTN, at goal currently controlled on diet and exercise.   #HLD, continue home  lipitor and Zetia (already high intensity)  #History of drug induced bradycardia  Code Status: Full  code  Family Communication: No family at bedside  Disposition Plan: Status post cath, monitor overnight, pending new cardiology recommendations on 12/17.  May be able to go home if stable pending cardiology approval.   Consultants:  Cardiology     Procedures:  None  Antimicrobials: Anti-infectives (From admission, onward)   None        Cultures:  None  Telemetry: Yes  DVT prophylaxis: Lovenox   Objective: Vitals:   07/22/18 1630 07/22/18 1635 07/22/18 1639 07/22/18 1700  BP: (!) 143/77   129/73  Pulse: 79 (!) 0 (!) 0 65  Resp: 13 13 (!) 81 14  Temp:    97.9 F (36.6 C)  TempSrc:    Oral  SpO2: 100% (!) 0% (!) 0% 100%  Weight:      Height:        Intake/Output Summary (Last 24 hours) at 07/22/2018 1901 Last data filed at 07/21/2018 1906 Gross per 24 hour  Intake 240 ml  Output -  Net 240 ml   Filed Weights   07/19/18 1940 07/21/18 0652 07/22/18 0440  Weight: 66 kg 63.8 kg 64.1 kg    Exam:  Constitutional:normal appearing male Eyes: EOMI, anicteric, normal conjunctivae ENMT: Oropharynx with moist mucous membranes, normal dentition Cardiovascular: RRR no MRGs, with no peripheral edema, no chest wall tenderness Respiratory: Normal respiratory effort, clear breath sounds  Abdomen: Soft,non-tender, with no HSM Skin: No rash ulcers, or lesions. Without skin tenting  Neurologic: Grossly no focal neuro deficit. Psychiatric:Appropriate affect, and mood. Mental status AAOx3  Data Reviewed: CBC: Recent Labs  Lab  07/19/18 1641 07/21/18 0056 07/22/18 0442  WBC 6.9 5.5 5.1  HGB 14.4 13.4 13.6  HCT 43.5 39.9 40.5  MCV 93.1 92.1 91.6  PLT 143* 144* 683*   Basic Metabolic Panel: Recent Labs  Lab 07/19/18 1641  NA 141  K 4.2  CL 102  CO2 28  GLUCOSE 89  BUN 24*  CREATININE 1.09  CALCIUM 9.7   GFR: Estimated Creatinine Clearance: 58.8 mL/min (by C-G formula based on SCr of 1.09 mg/dL). Liver Function Tests: No results for input(s):  AST, ALT, ALKPHOS, BILITOT, PROT, ALBUMIN in the last 168 hours. No results for input(s): LIPASE, AMYLASE in the last 168 hours. No results for input(s): AMMONIA in the last 168 hours. Coagulation Profile: No results for input(s): INR, PROTIME in the last 168 hours. Cardiac Enzymes: Recent Labs  Lab 07/19/18 2055 07/20/18 0420 07/21/18 0901  TROPONINI <0.03 <0.03 <0.03   BNP (last 3 results) No results for input(s): PROBNP in the last 8760 hours. HbA1C: No results for input(s): HGBA1C in the last 72 hours. CBG: No results for input(s): GLUCAP in the last 168 hours. Lipid Profile: Recent Labs    07/20/18 0420  CHOL 97  HDL 44  LDLCALC 41  TRIG 62  CHOLHDL 2.2   Thyroid Function Tests: No results for input(s): TSH, T4TOTAL, FREET4, T3FREE, THYROIDAB in the last 72 hours. Anemia Panel: No results for input(s): VITAMINB12, FOLATE, FERRITIN, TIBC, IRON, RETICCTPCT in the last 72 hours. Urine analysis:    Component Value Date/Time   COLORURINE YELLOW 08/16/2014 1846   APPEARANCEUR CLEAR 08/16/2014 1846   LABSPEC 1.013 08/16/2014 1846   PHURINE 5.5 08/16/2014 1846   GLUCOSEU NEGATIVE 08/16/2014 1846   HGBUR NEGATIVE 08/16/2014 1846   BILIRUBINUR NEGATIVE 08/16/2014 1846   KETONESUR NEGATIVE 08/16/2014 1846   PROTEINUR NEGATIVE 08/16/2014 1846   UROBILINOGEN 1.0 08/16/2014 1846   NITRITE NEGATIVE 08/16/2014 1846   LEUKOCYTESUR NEGATIVE 08/16/2014 1846   Sepsis Labs: @LABRCNTIP (procalcitonin:4,lacticidven:4)  ) Recent Results (from the past 240 hour(s))  MRSA PCR Screening     Status: None   Collection Time: 07/19/18  7:35 PM  Result Value Ref Range Status   MRSA by PCR NEGATIVE NEGATIVE Final    Comment:        The GeneXpert MRSA Assay (FDA approved for NASAL specimens only), is one component of a comprehensive MRSA colonization surveillance program. It is not intended to diagnose MRSA infection nor to guide or monitor treatment for MRSA  infections. Performed at [facility], [place] [address], [place], [place] [address]       Studies: No results found.  Scheduled Meds: . [START ON 07/23/2018] aspirin  81 mg Oral Daily  . atorvastatin  80 mg Oral QPM  . [START ON 07/23/2018] clopidogrel  75 mg Oral Q breakfast  . ezetimibe  10 mg Oral Daily  . sodium chloride flush  3 mL Intravenous Q12H    Continuous Infusions: . sodium chloride 75 mL/hr at 07/22/18 1730  . sodium chloride    . bivalirudin (ANGIOMAX) infusion 5 mg/mL (Cath Lab,ACS,PCI indication) 1.75 mg/kg/hr (07/22/18 1731)  . nitroGLYCERIN 10 mcg/min (07/20/18 1343)     LOS: 0 days     Desiree Hane, MD [facility] Pager [phone]  If 7PM-7AM, please contact night-coverage [email or web address] Password [email or web address] 07/22/2018, 7:01 PM

## 2018-07-22 NOTE — Interval H&P Note (Signed)
Cath Lab Visit (complete for each Cath Lab visit)  Clinical Evaluation Leading to the Procedure:   ACS: Yes.    Non-ACS:    Anginal Classification: CCS III  Anti-ischemic medical therapy: No Therapy  Non-Invasive Test Results: No non-invasive testing performed  Prior CABG: Previous CABG      History and Physical Interval Note:  07/22/2018 3:09 PM  Michael Herrera  has presented today for surgery, with the diagnosis of unstable angina  The various methods of treatment have been discussed with the patient and family. After consideration of risks, benefits and other options for treatment, the patient has consented to  Procedure(s): LEFT HEART CATH AND CORS/GRAFTS ANGIOGRAPHY (N/A) as a surgical intervention .  The patient's history has been reviewed, patient examined, no change in status, stable for surgery.  I have reviewed the patient's chart and labs.  Questions were answered to the patient's satisfaction.     Quay Burow

## 2018-07-22 NOTE — Progress Notes (Signed)
Progress Note  Patient Name: Michael Herrera Date of Encounter: 07/22/2018  Primary Cardiologist: Fransico Him, MD   Subjective   Still has occasional teeth pain which is his previous anginal equivalent. Denies any SOB.  Inpatient Medications    Scheduled Meds: . aspirin EC  325 mg Oral Daily  . atorvastatin  80 mg Oral QPM  . ezetimibe  10 mg Oral Daily   Continuous Infusions: . sodium chloride    . sodium chloride 1 mL/kg/hr (07/22/18 0503)  . heparin 700 Units/hr (07/22/18 0550)  . nitroGLYCERIN 10 mcg/min (07/20/18 1343)   PRN Meds: sodium chloride, acetaminophen, morphine injection, nitroGLYCERIN, ondansetron (ZOFRAN) IV   Vital Signs    Vitals:   07/21/18 0900 07/21/18 1317 07/21/18 2032 07/22/18 0440  BP: 119/61 111/67 115/70 116/67  Pulse: 64 68 (!) 57 63  Resp: 16  16 16   Temp: 98 F (36.7 C) 97.9 F (36.6 C) 97.8 F (36.6 C) (!) 97.5 F (36.4 C)  TempSrc:  Oral Oral Oral  SpO2:  100% 98% 99%  Weight:    64.1 kg  Height:        Intake/Output Summary (Last 24 hours) at 07/22/2018 0911 Last data filed at 07/21/2018 1906 Gross per 24 hour  Intake 575 ml  Output 2 ml  Net 573 ml   Filed Weights   07/19/18 1940 07/21/18 0652 07/22/18 0440  Weight: 66 kg 63.8 kg 64.1 kg    Telemetry    NSR without significant ventricular ectopy - Personally Reviewed  ECG    NSR without significant ST-T wave changes - Personally Reviewed  Physical Exam   GEN: No acute distress.   Neck: No JVD Cardiac: RRR, no murmurs, rubs, or gallops.  Respiratory: Clear to auscultation bilaterally. GI: Soft, nontender, non-distended  MS: No edema; No deformity. Neuro:  Nonfocal  Psych: Normal affect   Labs    Chemistry Recent Labs  Lab 07/19/18 1641  NA 141  K 4.2  CL 102  CO2 28  GLUCOSE 89  BUN 24*  CREATININE 1.09  CALCIUM 9.7  GFRNONAA >60  GFRAA >60  ANIONGAP 11     Hematology Recent Labs  Lab 07/19/18 1641 07/21/18 0056 07/22/18 0442    WBC 6.9 5.5 5.1  RBC 4.67 4.33 4.42  HGB 14.4 13.4 13.6  HCT 43.5 39.9 40.5  MCV 93.1 92.1 91.6  MCH 30.8 30.9 30.8  MCHC 33.1 33.6 33.6  RDW 12.2 12.3 12.3  PLT 143* 144* 149*    Cardiac Enzymes Recent Labs  Lab 07/19/18 2055 07/20/18 0420 07/21/18 0901  TROPONINI <0.03 <0.03 <0.03    Recent Labs  Lab 07/19/18 1652  TROPIPOC 0.01     BNPNo results for input(s): BNP, PROBNP in the last 168 hours.   DDimer No results for input(s): DDIMER in the last 168 hours.   Radiology    No results found.  Cardiac Studies   Echo 07/20/2018  LV EF: 55% -   60% Study Conclusions  - Left ventricle: The cavity size was normal. Wall thickness was   normal. Systolic function was normal. The estimated ejection   fraction was in the range of 55% to 60%. Wall motion was normal;   there were no regional wall motion abnormalities. Doppler   parameters are consistent with abnormal left ventricular   relaxation (grade 1 diastolic dysfunction).   Patient Profile     67 y.o. male with PMH of CAD and HLD who presented with intermittent chest  pain.   Assessment & Plan    1. Chest pain: reminescent of previous angina, trop negative and EKG unchanged.   - scheduled for cardiac catheterization today  - Risk and benefit of procedure explained to the patient who display clear understanding and agree to proceed.  Discussed with patient possible procedural risk include bleeding, vascular injury, renal injury, arrythmia, MI, stroke and loss of limb or life.  2. CAD s/p CABG LIMA to LAD, SVG to OM1, and SVG to D2 on 08/17/2014  3. HLD: continue on current lipitor and zetia, lipid very well controlled.       For questions or updates, please contact [place] Please consult [email or web address] for contact info under        Signed, Almyra Deforest, [place]  07/22/2018, 9:11 AM

## 2018-07-22 NOTE — Progress Notes (Signed)
ANTICOAGULATION CONSULT NOTE   Pharmacy Consult for heparin  Indication: NSTEMI / ACS  No Known Allergies  Patient Measurements: TBW 66 kg Heparin Dosing Weight: 66kg   Vital Signs: Temp: 97.5 F (36.4 C) (12/16 0440) Temp Source: Oral (12/16 0440) BP: 116/67 (12/16 0440) Pulse Rate: 63 (12/16 0440)  Labs: Recent Labs    07/19/18 1641 07/19/18 2055 07/20/18 0420  07/21/18 0056 07/21/18 0901 07/21/18 1510 07/22/18 0442 07/22/18 0444  HGB 14.4  --   --   --  13.4  --   --  13.6  --   HCT 43.5  --   --   --  39.9  --   --  40.5  --   PLT 143*  --   --   --  144*  --   --  149*  --   HEPARINUNFRC  --   --   --    < > 0.86* 0.92* 0.43  --  0.19*  CREATININE 1.09  --   --   --   --   --   --   --   --   TROPONINI  --  <0.03 <0.03  --   --  <0.03  --   --   --    < > = values in this interval not displayed.    Estimated Creatinine Clearance: 58.8 mL/min (by C-G formula based on SCr of 1.09 mg/dL).  Assessment: 67 yo M presents with CP. Pharmacy orginally consulted for Lovenox but patient transitions to heparin in anticipation of Cath. Last dose of enoxaparin given at 1134 12/14.  Hgb and plts stable. No bleeding noted and confirmed with nursing.  Heparin level now subtherapeutic  Goal of Therapy: Heparin level 0.3-0.7 units/ml  Monitor platelets by anticoagulation protocol: Yes   Plan:  Increase  heparin to 700 units / hr Follow up after cath   Thank you Excell Seltzer, PharmD 07/22/2018      5:47 AM

## 2018-07-23 ENCOUNTER — Encounter (HOSPITAL_COMMUNITY): Payer: Self-pay | Admitting: Cardiovascular Disease

## 2018-07-23 DIAGNOSIS — Z955 Presence of coronary angioplasty implant and graft: Secondary | ICD-10-CM

## 2018-07-23 LAB — BASIC METABOLIC PANEL
Anion gap: 9 (ref 5–15)
BUN: 14 mg/dL (ref 8–23)
CO2: 23 mmol/L (ref 22–32)
Calcium: 8.9 mg/dL (ref 8.9–10.3)
Chloride: 109 mmol/L (ref 98–111)
Creatinine, Ser: 1.18 mg/dL (ref 0.61–1.24)
GFR calc Af Amer: >60 mL/min (ref >60)
GFR calc non Af Amer: >60 mL/min (ref >60)
Glucose, Bld: 81 mg/dL (ref 70–99)
POTASSIUM: 4 mmol/L (ref 3.5–5.1)
Sodium: 141 mmol/L (ref 135–145)

## 2018-07-23 LAB — CBC
HCT: 40.3 % (ref 39.0–52.0)
Hemoglobin: 13.2 g/dL (ref 13.0–17.0)
MCH: 30.1 pg (ref 26.0–34.0)
MCHC: 32.8 g/dL (ref 30.0–36.0)
MCV: 92 fL (ref 80.0–100.0)
Platelets: 118 10*3/uL — ABNORMAL LOW (ref 150–400)
RBC: 4.38 MIL/uL (ref 4.22–5.81)
RDW: 12.3 % (ref 11.5–15.5)
WBC: 4.6 10*3/uL (ref 4.0–10.5)
nRBC: 0 % (ref 0.0–0.2)

## 2018-07-23 MED ORDER — CLOPIDOGREL BISULFATE 75 MG PO TABS: 75.0000 mg | ORAL_TABLET | Freq: Every day | ORAL | 3 refills | Status: DC

## 2018-07-23 NOTE — Progress Notes (Addendum)
Progress Note  Patient Name: Michael Herrera Date of Encounter: 07/23/2018 Primary Cardiologist: Fransico Him, MD Pt wishes to transition to Dr. Gwenlyn Found  Subjective   Patient has been up walking in the halls with no further anginal pains (jaw) shortness of breath.  His right groin is stable without hematoma.  He feels ready to go home.  Inpatient Medications    Scheduled Meds: . aspirin  81 mg Oral Daily  . atorvastatin  80 mg Oral QPM  . clopidogrel  75 mg Oral Q breakfast  . ezetimibe  10 mg Oral Daily  . sodium chloride flush  3 mL Intravenous Q12H   Continuous Infusions: . sodium chloride    . nitroGLYCERIN Stopped (07/22/18 2100)   PRN Meds: sodium chloride, acetaminophen, morphine injection, nitroGLYCERIN, ondansetron (ZOFRAN) IV, sodium chloride flush   Vital Signs    Vitals:   07/22/18 2341 07/22/18 2346 07/23/18 0607 07/23/18 0712  BP: 130/71 123/68 129/71 122/65  Pulse: 61 (!) 59 66   Resp: 11 13 18 19   Temp:   97.7 F (36.5 C)   TempSrc:   Oral   SpO2: 98% 97% 99%   Weight:   64.6 kg   Height:        Intake/Output Summary (Last 24 hours) at 07/23/2018 0820 Last data filed at 07/23/2018 0600 Gross per 24 hour  Intake 311.85 ml  Output 750 ml  Net -438.15 ml   Filed Weights   07/21/18 0652 07/22/18 0440 07/23/18 0607  Weight: 63.8 kg 64.1 kg 64.6 kg    Telemetry    Sinus rhythm 50 - 60s overnight- Personally Reviewed  ECG    Sinus rhythm, possible LAE, 62 bpm- Personally Reviewed  Physical Exam   GEN: No acute distress.   Neck: No JVD Cardiac: RRR, no murmurs, rubs, or gallops.  Respiratory: Clear to auscultation bilaterally. GI: Soft, nontender, non-distended  MS: No edema; No deformity. Neuro:  Nonfocal  Psych: Normal affect   Right groin without hematoma  Labs    Chemistry Recent Labs  Lab 07/19/18 1641 07/23/18 0511  NA 141 141  K 4.2 4.0  CL 102 109  CO2 28 23  GLUCOSE 89 81  BUN 24* 14  CREATININE 1.09 1.18    CALCIUM 9.7 8.9  GFRNONAA >60 >60  GFRAA >60 >60  ANIONGAP 11 9     Hematology Recent Labs  Lab 07/21/18 0056 07/22/18 0442 07/23/18 0511  WBC 5.5 5.1 4.6  RBC 4.33 4.42 4.38  HGB 13.4 13.6 13.2  HCT 39.9 40.5 40.3  MCV 92.1 91.6 92.0  MCH 30.9 30.8 30.1  MCHC 33.6 33.6 32.8  RDW 12.3 12.3 12.3  PLT 144* 149* 118*    Cardiac Enzymes Recent Labs  Lab 07/19/18 2055 07/20/18 0420 07/21/18 0901  TROPONINI <0.03 <0.03 <0.03    Recent Labs  Lab 07/19/18 1652  TROPIPOC 0.01     BNPNo results for input(s): BNP, PROBNP in the last 168 hours.   DDimer No results for input(s): DDIMER in the last 168 hours.   Radiology    No results found.  Cardiac Studies   LEFT HEART CATH AND CORS/GRAFTS ANGIOGRAPHY 07/22/18  Conclusion    Previously placed Ost LAD to Prox LAD stent (unknown type) is widely patent.  Ost Cx to Prox Cx lesion is 100% stenosed.  Previously placed Mid RCA stent (unknown type) is widely patent.  Dist RCA lesion is 70% stenosed.  Ost LM to Mid LM lesion is 80%  stenosed.  Origin to Insertion lesion before Mid LAD is 90% stenosed.  A stent was successfully placed.  Post intervention, there is a 0% residual stenosis.  Post intervention, there is a 0% residual stenosis.  Mid LAD lesion is 40% stenosed.  The left ventricular systolic function is normal.  LV end diastolic pressure is normal.  The left ventricular ejection fraction is 55-65% by visual estimate.   Diagnostic  Dominance: Right    Intervention    __________________________________  Echo 07/20/2018 LV EF: 55% - 60% Study Conclusions  - Left ventricle: The cavity size was normal. Wall thickness was normal. Systolic function was normal. The estimated ejection fraction was in the range of 55% to 60%. Wall motion was normal; there were no regional wall motion abnormalities. Doppler parameters are consistent with abnormal left ventricular relaxation (grade  1 diastolic dysfunction).  Patient Profile     67 y.o. male with PMH of CAD and HLD who presented with intermittent chest pain reminiscent of previous angina, troponins negative and no EKG changes.  Assessment & Plan    Chest pain -Patient underwent left heart cath yesterday that revealed, patent vein graft to an OM and diagonal branch, atretic LIMA to the LAD with high-grade ostial left main stenosis.  He underwent successful drug-eluting stent of the left main.  He also had 70% distal dominant RCA disease that will be treated medically.  -No further angina. -Right groin stable. -We will continue aspirin, Plavix 75 mg daily added. Pt wishes to have paper Rx for Plavix so that he can choose his pharmacy- provided.  -Pt is OK for discharge today.   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  Not on beta-blocker, heart rate in the 50s-60s overnight, likely would not tolerate. -Pt exercises regularly, walking up to 10 miles per day, and follows a heart healthy diet.  He does not smoke.  Hyperlipidemia -The patient was previously being treated with atorvastatin 80 mg daily, Zetia 10 mg daily, fish oil -LDL 41 which is at goal of <70.  Continue current therapy  [facility] will sign off.   Medication Recommendations:  Add Plavix 75 mg daily-prescription printed  Other recommendations (labs, testing, etc):  No work or strenuous exercise for 1 week.  Follow up as an outpatient:  Will arrange 7-10 day follow up  For questions or updates, please contact [place] Please consult [email or web address] for contact info under        Signed, Daune Perch, NP  07/23/2018, 8:20 AM

## 2018-07-23 NOTE — Progress Notes (Signed)
CARDIAC REHAB PHASE I   PRE:  Rate/Rhythm: 76 SR  BP:  Sitting: 122/65      SaO2: 98 RA  MODE:  Ambulation: 600 ft   POST:  Rate/Rhythm: 85 SR  BP:  Sitting: 126/57    SaO2: 99 RA   Pt ambulated 657ft in hallway with steady gait. Denies CP or SOB. PT and wife educate on importance of ASA, Plavix, and NTG. Stent card at bedside. Pt given heart healthy diet. Reviewed restrictions and exercise guidelines. Encouraged pt to listen to his body and ease back into his daily walking routine. Will refer to CRP II [place], though pt is not interested in attending.   [id number] Rufina Falco, RN BSN 07/23/2018 8:56 AM

## 2018-07-23 NOTE — Progress Notes (Signed)
Site area: right groin  Site Prior to Removal:  Level 0  Pressure Applied For 35 MINUTES    Minutes Beginning at 2306  Manual:   Yes.    Patient Status During Pull:  Stable, WNL  Post Pull Groin Site:  Level 0  Post Pull Instructions Given:  Yes.    Post Pull Pulses Present:  Yes.    Dressing Applied:  Yes.

## 2018-07-23 NOTE — Discharge Summary (Signed)
Discharge Summary  Michael Herrera [id number] DOB: [birth year]  PCP: Orpah Melter, MD  Admit date: 07/19/2018 Discharge date: 07/23/2018   Time spent: < 25 minutes  Admitted From: home Disposition:  home  Recommendations for Outpatient Follow-up:  1. Follow up with PCP in 1 week 2. Follow up with Cardiology on 12/24 3. Start Plavix in addition to continuing aspirin, continue atorvastatin and zetia     Discharge Diagnoses:  Active Hospital Problems   Diagnosis Date Noted  . Chest pain due to CAD ([place]) 07/19/2018  . Chest pain 07/22/2018  . Chest pain on exertion 07/20/2018  . 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 Dyslipidemia 05/13/2013  . CAD (coronary artery disease) 05/13/2013    Resolved Hospital Problems  No resolved problems to display.    Discharge Condition: Stable   CODE STATUS:Full   History of present illness:  Michael Herrera is a 67 y.o. year old male with medical history significant for  CAD s/p CABG ( 06/2015) and PCI of mid to distal RCA and LAD(2015),HTN,  HLD, GERD who presented on 07/19/2018 with chest pain and tooth pain.  Patient has excellent exercise tolerance at baseline typically walks 2 miles a day exercises 3 times a day with intensive cardio.  States 2 days prior to admission noticed dull chest pain with radiation to back and tooth/jaw similar to his prior chest pain from 2016 and 2015 when he required a CABG and stent respectively).  On yesterday patient took nitro with some relief of chest pain but again notice worsening chest pain while either walking uphill during his walks or even going up the stairs in his house.  He called his cardiologist who recommended evaluation in the ED.  In the ED his EKG was nonischemic, troponin trend has been negative, patient was admitted for observation given previous history of tooth pain/jaw pain is anginal equivalent. . Remaining hospital course addressed in problem based format below:   Hospital  Course:   Chest pain with exertion, consistent with stable angina.  Patient had chest pain as well as tooth/jaw pain that is his anginal equivalent based on previous presentations in 2015and 2016 per documentation by his cardiologist.  EKG was nonischemic, troponin trend x3 was unremarkable.  TTE showed EF of 55-60% with no wall motion abnormalities.  With cardiology assistance patient was started on nitro and heparin infusion.  He remained chest pain-free within the first 24 hours.  Underwent left heart cath on 12/16 which showedLIMA-LAD that was atretic and high-grade ostial LAD lesion which was treated with PCI, there was also distal RCA lesion which cardiology recommends medical treatment with dual antiplatelet therapy.  On discharge he will continue aspirin Plavix and has outpatient follow-up scheduled with cardiology.  CAD status post CABG (2016).  Now on dual antiplatelet therapy with aspirin and Plavix on discharge.  Cardiology did not add a beta-blocker given his history of drug-induced bradycardia, heart rate was monitor throughout stay remained 50s over 60s, concerned he would not tolerate beta-blocker if initiated.  Hypertension.  Remained at goal throughout stay.  We will continue to control on diet and exercise.  Hyperlipidemia.  Continue home Lipitor (high intensity, LDL 41, goal less than 70) and Zetia.  History of drug-induced bradycardia.   Consultations:  Cardiology  Procedures/Studies: 12/14, TTE LV EF: 55% - 60% Study Conclusions  - Left ventricle: The cavity size was normal. Wall thickness was normal. Systolic function was normal. The estimated ejection fraction was in the range of 55% to  60%. Wall motion was normal; there were no regional wall motion abnormalities. Doppler parameters are consistent with abnormal left ventricular relaxation (grade 1 diastolic dysfunction).  LEFT HEART CATH AND CORS/GRAFTS ANGIOGRAPHY 07/22/18  Conclusion     Previously placed Ost LAD to Prox LAD stent (unknown type) is widely patent.  Ost Cx to Prox Cx lesion is 100% stenosed.  Previously placed Mid RCA stent (unknown type) is widely patent.  Dist RCA lesion is 70% stenosed.  Ost LM to Mid LM lesion is 80% stenosed.  Origin to Insertion lesion before Mid LAD is 90% stenosed.  A stent was successfully placed.  Post intervention, there is a 0% residual stenosis.  Post intervention, there is a 0% residual stenosis.  Mid LAD lesion is 40% stenosed.  The left ventricular systolic function is normal.  LV end diastolic pressure is normal.  The left ventricular ejection fraction is 55-65% by visual estimate.     Discharge Exam: BP 122/65   Pulse 66   Temp 97.7 F (36.5 C) (Oral)   Resp 19   Ht 5\' 10"  (1.778 m)   Wt 64.6 kg   SpO2 99%   BMI 20.43 kg/m   General: Lying in bed, no apparent distress Eyes: EOMI, anicteric ENT: Oral Mucosa clear and moist Cardiovascular: regular rate and rhythm, no murmurs, rubs or gallops, no edema, Respiratory: Normal respiratory effort, lungs clear to auscultation bilaterally Abdomen: soft, non-distended, non-tender, normal bowel sounds Skin: Right groin with post procedure dressing in place, clean dry intact, nontender, no tenderness or swelling Neurologic: Grossly no focal neuro deficit.Mental status AAOx3, speech normal, Psychiatric:Appropriate affect, and mood   Discharge Instructions You were cared for by a hospitalist during your hospital stay. If you have any questions about your discharge medications or the care you received while you were in the hospital after you are discharged, you can call the unit and asked to speak with the hospitalist on call if the hospitalist that took care of you is not available. Once you are discharged, your primary care physician will handle any further medical issues. Please note that NO REFILLS for any discharge medications will be authorized once  you are discharged, as it is imperative that you return to your primary care physician (or establish a relationship with a primary care physician if you do not have one) for your aftercare needs so that they can reassess your need for medications and monitor your lab values.  Discharge Instructions    AMB Referral to Cardiac Rehabilitation - Phase II   Complete by:  As directed    Diagnosis:  Coronary Stents   Diet - low sodium heart healthy   Complete by:  As directed    Increase activity slowly   Complete by:  As directed      Allergies as of 07/23/2018   No Known Allergies     Medication List    TAKE these medications   aspirin 81 MG tablet Take 81 mg by mouth every evening.   atorvastatin 80 MG tablet Commonly known as:  LIPITOR Take 1 tablet (80 mg total) by mouth every evening.   clopidogrel 75 MG tablet Commonly known as:  PLAVIX Take 1 tablet (75 mg total) by mouth daily.   ezetimibe 10 MG tablet Commonly known as:  ZETIA Take 1 tablet (10 mg total) by mouth daily.   Fish Oil 1200 MG Caps Take 1,200 mg by mouth every morning.   nitroGLYCERIN 0.4 MG SL tablet Commonly known as:  NITROSTAT Place 1 tablet (0.4 mg total) under the tongue every 5 (five) minutes as needed for chest pain.   vitamin C 500 MG tablet Commonly known as:  ASCORBIC ACID Take 500 mg by mouth every morning.   vitamin E 400 UNIT capsule Take 400 Units by mouth daily.      No Known Allergies Follow-up Information    Daune Perch, NP Follow up.   Specialty:  Cardiology Why:  Cardiology hospital follow-up on 07/30/2018 at 11:30 AM.  Please arrive 15 minutes early for check-in. Contact information: [address] [place] [address] [phone]            The results of significant diagnostics from this hospitalization (including imaging, microbiology, ancillary and laboratory) are listed below for reference.    Significant Diagnostic Studies: Dg Chest 2  View  Result Date: 07/19/2018 CLINICAL DATA:  Chest pain. EXAM: CHEST - 2 VIEW COMPARISON:  Chest x-ray dated November 17, 2014. FINDINGS: The heart size and mediastinal contours are within normal limits. Prior CABG. Normal pulmonary vascularity. No focal consolidation, pleural effusion, or pneumothorax. No acute osseous abnormality. IMPRESSION: No active cardiopulmonary disease. Electronically Signed   By: Titus Dubin M.D.   On: 07/19/2018 17:09    Microbiology: Recent Results (from the past 240 hour(s))  MRSA PCR Screening     Status: None   Collection Time: 07/19/18  7:35 PM  Result Value Ref Range Status   MRSA by PCR NEGATIVE NEGATIVE Final    Comment:        The GeneXpert MRSA Assay (FDA approved for NASAL specimens only), is one component of a comprehensive MRSA colonization surveillance program. It is not intended to diagnose MRSA infection nor to guide or monitor treatment for MRSA infections. Performed at [facility], [place] [address], [place], [place] [address]      Labs: Basic Metabolic Panel: Recent Labs  Lab 07/19/18 1641 07/23/18 0511  NA 141 141  K 4.2 4.0  CL 102 109  CO2 28 23  GLUCOSE 89 81  BUN 24* 14  CREATININE 1.09 1.18  CALCIUM 9.7 8.9   Liver Function Tests: No results for input(s): AST, ALT, ALKPHOS, BILITOT, PROT, ALBUMIN in the last 168 hours. No results for input(s): LIPASE, AMYLASE in the last 168 hours. No results for input(s): AMMONIA in the last 168 hours. CBC: Recent Labs  Lab 07/19/18 1641 07/21/18 0056 07/22/18 0442 07/23/18 0511  WBC 6.9 5.5 5.1 4.6  HGB 14.4 13.4 13.6 13.2  HCT 43.5 39.9 40.5 40.3  MCV 93.1 92.1 91.6 92.0  PLT 143* 144* 149* 118*   Cardiac Enzymes: Recent Labs  Lab 07/19/18 2055 07/20/18 0420 07/21/18 0901  TROPONINI <0.03 <0.03 <0.03   BNP: BNP (last 3 results) No results for input(s): BNP in the last 8760 hours.  ProBNP (last 3 results) No results for input(s): PROBNP in the last 8760  hours.  CBG: No results for input(s): GLUCAP in the last 168 hours.     Signed:  Desiree Hane, MD [facility] 07/23/2018, 8:28 AM

## 2018-07-23 NOTE — Discharge Instructions (Signed)
PLEASE REMEMBER TO BRING ALL OF YOUR MEDICATIONS TO EACH OF YOUR FOLLOW-UP OFFICE VISITS.  PLEASE ATTEND ALL SCHEDULED FOLLOW-UP APPOINTMENTS:   Cardiology [phone] [address] [place] [address]     Next Steps: Follow up    Instructions: Cardiology hospital follow-up on 07/30/2018 at 11:30 AM. Please arrive 15 minutes early for check-in.       Activity: Increase activity slowly as tolerated. You may shower, but no soaking baths (or swimming) for 1 week. No driving for 24 hours. No lifting over 5 lbs for 1 week. No sexual activity for 1 week.   You May Return to Work: in 1 week (if applicable)  Wound Care: You may wash cath site gently with soap and water. Keep cath site clean and dry. If you notice pain, swelling, bleeding or pus at your cath site, please call [phone].

## 2018-07-30 ENCOUNTER — Encounter: Payer: Self-pay | Admitting: Cardiology

## 2018-07-30 ENCOUNTER — Ambulatory Visit: Payer: PPO | Admitting: Cardiology

## 2018-07-30 VITALS — BP 116/58 | HR 60 | Ht 70.0 in | Wt 140.4 lb

## 2018-07-30 DIAGNOSIS — E785 Hyperlipidemia, unspecified: Secondary | ICD-10-CM | POA: Diagnosis not present

## 2018-07-30 DIAGNOSIS — I25118 Atherosclerotic heart disease of native coronary artery with other forms of angina pectoris: Secondary | ICD-10-CM

## 2018-07-30 NOTE — Progress Notes (Signed)
Cardiology Office Note:    Date:  07/30/2018   ID:  Michael Herrera, DOB Dec 16, 1950, MRN [id number]  PCP:  Orpah Melter, MD  Cardiologist:  Quay Burow, MD Previously Dr. Radford Pax  Referring MD: Orpah Melter, MD   Chief Complaint  Patient presents with  . Hospitalization Follow-up    Post PCI   History of Present Illness:    Michael Herrera is a 67 y.o. male with a past medical history significant for CAD with PCI of the mid to distal RCA and LAD, s/p CABG 2016 and dyslipidemia.  He was admitted to [facility] on 07/19/2018 for evaluation of chest pain.  EKG was nonischemic, troponin negative, echocardiogram with no regional wall motion abnormalities and normal EF.  He continued to have chest pain and cardiac cath was done revealing LIMA-LAD was atretic, and there was high-grade ostial left main lesion. The left main was was treated with PCI.  There was a distal 70% RCA lesion which will be treated medically.  Michael Herrera is here today for hospital follow-up alone. He has had some mild chest discomfort and mild pain in teeth, but he does not thinks it is significant, not every day. He says that it took his body a while to get used to the stents in the past.  He is back to walking and walked for 35 minutes yesterday without any exertional symptoms. He's had no shortness of breath, orthopnea, palpitations, lightheadedness of syncope.   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 He stays active walking up to 10 miles per day. He has walked over 9000 miles since his bypass in 2016, clocked on [employer]. He is starting back to walking slowly with 1.5-2 miles per day.   Past Medical History:  Diagnosis Date  . Anemia associated with acute blood loss 08/31/2014  . Carotid artery occlusion 07/2014   1-39% bilateral plaque  . Coronary atherosclerosis of native coronary artery 2001   s/p PCI with  3.0 x 32 Promus DES mLAD. Ca L main w/o sig obst, mod dz  in mLCx. Moderate focal lesion in OM1. Mild to mod dz & patent stents in RCA.  Repeat cath 2016 with progression of CAD with angina S/P CABG with LIMA to LAD, SVG to OM1 and SVG to D2 on 08/17/2014  . 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 Dyslipidemia   . Edema extremities 08/31/2014  . GERD (gastroesophageal reflux disease)   . Hearing loss, left   . History of blood transfusion 1956  . Low back pain   . Skin cancer of face     Past Surgical History:  Procedure Laterality Date  . CORONARY ANGIOPLASTY WITH STENT PLACEMENT  2001; 10/29/2013   PCI of RCA "X 2"; PCI of LAD  . CORONARY ARTERY BYPASS GRAFT N/A 08/17/2014   Procedure: CORONARY ARTERY BYPASS GRAFTING (CABG);  Surgeon: Melrose Nakayama, MD;  Location: [place];  Service: Open Heart Surgery;  Laterality: N/A;  Times three using left internal mammary artery and endoscopically harvested right saphenous vein  . CORONARY STENT INTERVENTION N/A 07/22/2018   Procedure: CORONARY STENT INTERVENTION;  Surgeon: Lorretta Harp, MD;  Location: [place] CV [facility];  Service: Cardiovascular;  Laterality: N/A;  . KNEE ARTHROSCOPY Right 1980's  . KNEE ARTHROSCOPY Left 2007; 2008  . LEFT HEART CATH AND CORS/GRAFTS ANGIOGRAPHY N/A 07/22/2018   Procedure: LEFT HEART CATH AND CORS/GRAFTS ANGIOGRAPHY;  Surgeon: Lorretta Harp,  MD;  Location: [place] [facility];  Service: Cardiovascular;  Laterality: N/A;  . LEFT HEART CATHETERIZATION WITH CORONARY ANGIOGRAM N/A 10/29/2013   Procedure: LEFT HEART CATHETERIZATION WITH CORONARY ANGIOGRAM;  Surgeon: Jettie Booze, MD;  Location: [facility] CATH LAB;  Service: Cardiovascular;  Laterality: N/A;  . LEFT HEART CATHETERIZATION WITH CORONARY ANGIOGRAM N/A 08/14/2014   Procedure: LEFT HEART CATHETERIZATION WITH CORONARY ANGIOGRAM;  Surgeon: Jettie Booze, MD;  Location: [facility] CATH LAB;  Service: Cardiovascular;  Laterality: N/A;  . REFRACTIVE SURGERY Bilateral ?2007  . SKIN CANCER EXCISION Left    "cheek"  .  TONSILLECTOMY AND ADENOIDECTOMY  ~ 1956    Current Medications: Current Meds  Medication Sig  . aspirin 81 MG tablet Take 81 mg by mouth every evening.   [name] atorvastatin (LIPITOR) 80 MG tablet Take 1 tablet (80 mg total) by mouth every evening.  . clopidogrel (PLAVIX) 75 MG tablet Take 1 tablet (75 mg total) by mouth daily.  [name] ezetimibe (ZETIA) 10 MG tablet Take 1 tablet (10 mg total) by mouth daily.  . nitroGLYCERIN (NITROSTAT) 0.4 MG SL tablet Place 1 tablet (0.4 mg total) under the tongue every 5 (five) minutes as needed for chest pain.  . Omega-3 Fatty Acids (FISH OIL) 1200 MG CAPS Take 1,200 mg by mouth every morning.  . vitamin C (ASCORBIC ACID) 500 MG tablet Take 500 mg by mouth every morning.   . vitamin E 400 UNIT capsule Take 400 Units by mouth daily.     Allergies:   Patient has no known allergies.   Social History   Socioeconomic History  . Marital status: Married    Spouse name: Not on file  . Number of children: Not on file  . Years of education: Not on file  . Highest education level: Not on file  Occupational History  . Not on file  Social Needs  . Financial resource strain: Not on file  . Food insecurity:    Worry: Not on file    Inability: Not on file  . Transportation needs:    Medical: Not on file    Non-medical: Not on file  Tobacco Use  . Smoking status: Never Smoker  . Smokeless tobacco: Never Used  Substance and Sexual Activity  . Alcohol use: Yes    Alcohol/week: 3.0 standard drinks    Types: 3 Cans of beer per week    Comment: 1 beer 3 times weekly  . Drug use: No  . Sexual activity: Yes  Lifestyle  . Physical activity:    Days per week: Not on file    Minutes per session: Not on file  . Stress: Not on file  Relationships  . Social connections:    Talks on phone: Not on file    Gets together: Not on file    Attends religious service: Not on file    Active member of club or organization: Not on file    Attends meetings of clubs or  organizations: Not on file    Relationship status: Not on file  Other Topics Concern  . Not on file  Social History Narrative  . Not on file     Family History: The patient's family history includes Dementia in his father. ROS:   Please see the history of present illness.     All other systems reviewed and are negative.  EKGs/Labs/Other Studies Reviewed:    The following studies were reviewed today:  LEFT HEART CATH AND CORS/GRAFTS ANGIOGRAPHY  07/22/18  Conclusion    Previously placed Ost LAD to Prox LAD stent (unknown type) is widely patent.  Ost Cx to Prox Cx lesion is 100% stenosed.  Previously placed Mid RCA stent (unknown type) is widely patent.  Dist RCA lesion is 70% stenosed.  Ost LM to Mid LM lesion is 80% stenosed.  Origin to Insertion lesion before Mid LAD is 90% stenosed.  A stent was successfully placed.  Post intervention, there is a 0% residual stenosis.  Post intervention, there is a 0% residual stenosis.  Mid LAD lesion is 40% stenosed.  The left ventricular systolic function is normal.  LV end diastolic pressure is normal.  The left ventricular ejection fraction is 55-65% by visual estimate.    IMPRESSION: Mr. Keeven has a patent vein graft to an OM and diagonal branch, a moderate distal dominant RCA at the crux and an atretic LIMA to the LAD with a high-grade ostial left main stenosis.  We performed PCI and drug-eluting stenting of the left main postdilated to 3.4 mm with excellent angiographic result.  The sheath will be removed and pressure held.  Angiomax will continue for 4 hours at full dose and then be discontinued.  We will treat the distal RCA medically at this time.  Quay Burow. MD, [facility]  Echocardiogram 07/20/2018 Study Conclusions - Left ventricle: The cavity size was normal. Wall thickness was   normal. Systolic function was normal. The estimated ejection   fraction was in the range of 55% to 60%. Wall motion was normal;    there were no regional wall motion abnormalities. Doppler   parameters are consistent with abnormal left ventricular   relaxation (grade 1 diastolic dysfunction).  EKG:  EKG is not ordered today.    Recent Labs: 07/23/2018: BUN 14; Creatinine, Ser 1.18; Hemoglobin 13.2; Platelets 118; Potassium 4.0; Sodium 141   Recent Lipid Panel    Component Value Date/Time   CHOL 97 07/20/2018 0420   TRIG 62 07/20/2018 0420   HDL 44 07/20/2018 0420   CHOLHDL 2.2 07/20/2018 0420   VLDL 12 07/20/2018 0420   LDLCALC 41 07/20/2018 0420    Physical Exam:    VS:  BP (!) 116/58   Pulse 60   Ht 5\' 10"  (1.778 m)   Wt 140 lb 6.4 oz (63.7 kg)   SpO2 96%   BMI 20.15 kg/m     Wt Readings from Last 3 Encounters:  07/30/18 140 lb 6.4 oz (63.7 kg)  07/23/18 142 lb 6.7 oz (64.6 kg)  11/30/16 132 lb 12.8 oz (60.2 kg)     Physical Exam  Constitutional: He is oriented to person, place, and time. He appears well-developed and well-nourished. No distress.  HENT:  Head: Normocephalic and atraumatic.  Neck: Normal range of motion. Neck supple. No JVD present.  Cardiovascular: Normal rate, regular rhythm, normal heart sounds and intact distal pulses. Exam reveals no gallop and no friction rub.  No murmur heard. Pulmonary/Chest: Effort normal and breath sounds normal.  Abdominal: Soft. Bowel sounds are normal.  Musculoskeletal: Normal range of motion.        General: No edema.  Neurological: He is alert and oriented to person, place, and time.  Skin: Skin is warm and dry.  Psychiatric: He has a normal mood and affect. His behavior is normal. Judgment and thought content normal.  Vitals reviewed.    ASSESSMENT:    1. Coronary artery disease of native artery of native heart with stable angina pectoris ([place])   2. Dyslipidemia  PLAN:    In order of problems listed above:  CAD -History of CABG with LIMA to LAD, SVG to OM1 and SVG to D2 08/2014 -Patient admitted to the hospital 07/20/2018 with  chest pain.  LHC done that showed LIMA-LAD was atretic, and there was high-grade ostial left main lesion which was treated with PCI.  There was a distal 70% RCA lesion which will be treated medically. -He continues on aspirin, Plavix, high intensity statin.  Not on beta-blocker due to bradycardia. -Patient exercises regularly, walking up to 10 miles per day and follows a heart healthy diet.  He does not smoke. He is frustrated that he has had this issue. I tried to reassure him that he did not cause this, his graft just failed as they sometimes do.   Hyperlipidemia -On atorvastatin 80 mg daily, Zetia 10 mg and fish oil. -On 07/20/2018 LDL was 41, which is at goal of <70. -Continue current therapy  Medication Adjustments/Labs and Tests Ordered: Current medicines are reviewed at length with the patient today.  Concerns regarding medicines are outlined above. Labs and tests ordered and medication changes are outlined in the patient instructions below:  Patient Instructions  Medication Instructions:  Your physician recommends that you continue on your current medications as directed. Please refer to the Current Medication list given to you today.  If you need a refill on your cardiac medications before your next appointment, please call your pharmacy.   Lab work: None  If you have labs (blood work) drawn today and your tests are completely normal, you will receive your results only by: [name] MyChart Message (if you have MyChart) OR . A paper copy in the mail If you have any lab test that is abnormal or we need to change your treatment, we will call you to review the results.  Testing/Procedures: None  Follow-Up: At [facility], you and your health needs are our priority.  As part of our continuing mission to provide you with exceptional heart care, we have created designated Provider Care Teams.  These Care Teams include your primary Cardiologist (physician) and Advanced Practice Providers  (APPs -  Physician Assistants and Nurse Practitioners) who all work together to provide you with the care you need, when you need it. You will need a follow up appointment in 4 months.  Please call our office 2 months in advance to schedule this appointment.  You may see Dr.Berry or one of the following Advanced Practice Providers on your designated Care Team:   Kerin Ransom, PA-C Roby Lofts, [place] . Sande Rives, PA-C  Any Other Special Instructions Will Be Listed Below (If Applicable).         Signed, Daune Perch, NP  07/30/2018 12:05 PM    [place]

## 2018-07-30 NOTE — Patient Instructions (Signed)
Medication Instructions:  Your physician recommends that you continue on your current medications as directed. Please refer to the Current Medication list given to you today.  If you need a refill on your cardiac medications before your next appointment, please call your pharmacy.   Lab work: None  If you have labs (blood work) drawn today and your tests are completely normal, you will receive your results only by: [name] MyChart Message (if you have MyChart) OR . A paper copy in the mail If you have any lab test that is abnormal or we need to change your treatment, we will call you to review the results.  Testing/Procedures: None  Follow-Up: At [facility], you and your health needs are our priority.  As part of our continuing mission to provide you with exceptional heart care, we have created designated Provider Care Teams.  These Care Teams include your primary Cardiologist (physician) and Advanced Practice Providers (APPs -  Physician Assistants and Nurse Practitioners) who all work together to provide you with the care you need, when you need it. You will need a follow up appointment in 4 months.  Please call our office 2 months in advance to schedule this appointment.  You may see Dr.Berry or one of the following Advanced Practice Providers on your designated Care Team:   Kerin Ransom, PA-C Roby Lofts, [place] . Sande Rives, PA-C  Any Other Special Instructions Will Be Listed Below (If Applicable).

## 2018-11-26 ENCOUNTER — Telehealth: Payer: Self-pay | Admitting: Cardiovascular Disease

## 2018-11-26 ENCOUNTER — Telehealth: Payer: Self-pay | Admitting: *Deleted

## 2018-11-26 ENCOUNTER — Encounter: Payer: Self-pay | Admitting: *Deleted

## 2018-11-26 NOTE — Telephone Encounter (Signed)
Smartphone/ virtual consent/ my chart/ pre reg completed °

## 2018-11-26 NOTE — Telephone Encounter (Signed)
Virtual Visit Pre-Appointment Phone Call  Steps For Call:  1. Confirm consent - "In the setting of the current Covid19 crisis, you are scheduled for a (phone or video) visit with your provider on (date) at (time).  Just as we do with many in-office visits, in order for you to participate in this visit, we must obtain consent.  If you'd like, I can send this to your mychart (if signed up) or email for you to review.  Otherwise, I can obtain your verbal consent now.  All virtual visits are billed to your insurance company just like a normal visit would be.  By agreeing to a virtual visit, we'd like you to understand that the technology does not allow for your provider to perform an examination, and thus may limit your provider's ability to fully assess your condition. If your provider identifies any concerns that need to be evaluated in person, we will make arrangements to do so.  Finally, though the technology is pretty good, we cannot assure that it will always work on either your or our end, and in the setting of a video visit, we may have to convert it to a phone-only visit.  In either situation, we cannot ensure that we have a secure connection.  Are you willing to proceed?" STAFF: Did the patient verbally acknowledge consent to telehealth visit? Document YES/NO here: YES  2. Confirm the BEST phone number to call the day of the visit by including in appointment notes  3. Give patient instructions for MyChart download to smartphone OR [email or web address] as below if video visit (depending on what platform provider is using)  4. Confirm that appointment type is correct in Epic appointment notes (VIDEO vs PHONE)  5. Advise patient to be prepared with their blood pressure, heart rate, weight, any heart rhythm information, their current medicines, and a piece of paper and pen handy for any instructions they may receive the day of their visit  6. Inform patient they will receive a phone call 15 minutes  prior to their appointment time (may be from unknown caller ID) so they should be prepared to answer    TELEPHONE CALL NOTE  CARMELLO CABINESS has been deemed a candidate for a follow-up tele-health visit to limit community exposure during the Covid-19 pandemic. I spoke with the patient via phone to ensure availability of phone/video source, confirm preferred email & phone number, and discuss instructions and expectations.  I reminded DEAKIN LACEK to be prepared with any vital sign and/or heart rhythm information that could potentially be obtained via home monitoring, at the time of his visit. I reminded SAMMY CASSAR to expect a phone call prior to his visit.  Venetia Maxon, CMA 11/26/2018 10:18 AM   INSTRUCTIONS FOR DOWNLOADING THE MYCHART APP TO SMARTPHONE  - The patient must first make sure to have activated MyChart and know their login information - If Apple, go to [employer] and type in MyChart in the search bar and download the app. If Android, ask patient to go to [employer] and type in [place] in the search bar and download the app. The app is free but as with any other app downloads, their phone may require them to verify saved payment information or Apple/Android password.  - The patient will need to then log into the app with their MyChart username and password, and select [facility] as their healthcare provider to link the account. When it is time for your  visit, go to the MyChart app, find appointments, and click Begin Video Visit. Be sure to Select Allow for your device to access the Microphone and Camera for your visit. You will then be connected, and your provider will be with you shortly.  **If they have any issues connecting, or need assistance please contact MyChart service desk [phone])**  **If using a computer, in order to ensure the best quality for their visit they will need to use either of the following Internet Browsers: [employer],  or Google Chrome**  IF USING DOXIMITY or DOXY.ME - The patient will receive a link just prior to their visit by text.     FULL LENGTH CONSENT FOR TELE-HEALTH VISIT   I hereby voluntarily request, consent and authorize [facility] and its employed or contracted physicians, physician assistants, nurse practitioners or other licensed health care professionals (the Practitioner), to provide me with telemedicine health care services (the "Services") as deemed necessary by the treating Practitioner. I acknowledge and consent to receive the Services by the Practitioner via telemedicine. I understand that the telemedicine visit will involve communicating with the Practitioner through live audiovisual communication technology and the disclosure of certain medical information by electronic transmission. I acknowledge that I have been given the opportunity to request an in-person assessment or other available alternative prior to the telemedicine visit and am voluntarily participating in the telemedicine visit.  I understand that I have the right to withhold or withdraw my consent to the use of telemedicine in the course of my care at any time, without affecting my right to future care or treatment, and that the Practitioner or I may terminate the telemedicine visit at any time. I understand that I have the right to inspect all information obtained and/or recorded in the course of the telemedicine visit and may receive copies of available information for a reasonable fee.  I understand that some of the potential risks of receiving the Services via telemedicine include:  [name] Delay or interruption in medical evaluation due to technological equipment failure or disruption; . Information transmitted may not be sufficient (e.g. poor resolution of images) to allow for appropriate medical decision making by the Practitioner; and/or  . In rare instances, security protocols could fail, causing a breach of personal health  information.  Furthermore, I acknowledge that it is my responsibility to provide information about my medical history, conditions and care that is complete and accurate to the best of my ability. I acknowledge that Practitioner's advice, recommendations, and/or decision may be based on factors not within their control, such as incomplete or inaccurate data provided by me or distortions of diagnostic images or specimens that may result from electronic transmissions. I understand that the practice of medicine is not an exact science and that Practitioner makes no warranties or guarantees regarding treatment outcomes. I acknowledge that I will receive a copy of this consent concurrently upon execution via email to the email address I last provided but may also request a printed copy by calling the office of [facility].    I understand that my insurance will be billed for this visit.   I have read or had this consent read to me. . I understand the contents of this consent, which adequately explains the benefits and risks of the Services being provided via telemedicine.  . I have been provided ample opportunity to ask questions regarding this consent and the Services and have had my questions answered to my satisfaction. . I give my  informed consent for the services to be provided through the use of telemedicine in my medical care  By participating in this telemedicine visit I agree to the above.      Cardiac Questionnaire:    Since your last visit or hospitalization:    1. Have you been having new or worsening chest pain? NO   2. Have you been having new or worsening shortness of breath? NO 3. Have you been having new or worsening leg swelling, wt gain, or increase in abdominal girth (pants fitting more tightly)? NO   4. Have you had any passing out spells? NO    *A YES to any of these questions would result in the appointment being kept. *If all the answers to these questions are NO, we  should indicate that given the current situation regarding the worldwide coronarvirus pandemic, at the recommendation of the CDC, we are looking to limit gatherings in our waiting area, and thus will reschedule their appointment beyond four weeks from today.   _____________   [id number] Pre-Screening Questions:  . Do you currently have a fever? NO . Have you recently travelled on a cruise, internationally, or to [place], [place], [place], [place], [place], or [place], [place] [employer]) ? NO . Have you been in contact with someone that is currently pending confirmation of Covid19 testing or has been confirmed to have the [place] virus?  NO . Are you currently experiencing fatigue or cough? NO      Spoke with patient and we reviewed his medications, allergies, history, and pharmacy.

## 2018-11-27 ENCOUNTER — Ambulatory Visit: Payer: PPO | Admitting: Cardiovascular Disease

## 2018-11-27 ENCOUNTER — Telehealth: Payer: Self-pay

## 2018-11-27 ENCOUNTER — Telehealth (INDEPENDENT_AMBULATORY_CARE_PROVIDER_SITE_OTHER): Payer: PPO | Admitting: Cardiovascular Disease

## 2018-11-27 DIAGNOSIS — I2510 Atherosclerotic heart disease of native coronary artery without angina pectoris: Secondary | ICD-10-CM

## 2018-11-27 DIAGNOSIS — E785 Hyperlipidemia, unspecified: Secondary | ICD-10-CM | POA: Diagnosis not present

## 2018-11-27 NOTE — Patient Instructions (Signed)

## 2018-11-27 NOTE — Progress Notes (Signed)
Virtual Visit via Video Note   This visit type was conducted due to national recommendations for restrictions regarding the COVID-19 Pandemic (e.g. social distancing) in an effort to limit this patient's exposure and mitigate transmission in our community.  Due to his co-morbid illnesses, this patient is at least at moderate risk for complications without adequate follow up.  This format is felt to be most appropriate for this patient at this time.  All issues noted in this document were discussed and addressed.  A limited physical exam was performed with this format.  Please refer to the patient's chart for his consent to telehealth for [facility]   Evaluation Performed:  Follow-up visit  Date:  11/27/2018   ID:  Michael Herrera, DOB 1951-01-19, MRN [id number]  Patient Location: Home Provider Location: Home  PCP:  Orpah Melter, MD  Cardiologist:  Quay Burow, MD  Electrophysiologist:  None   Chief Complaint: First postop virtual office visit.  History of Present Illness:    Michael Herrera is a 68 year old fit appearing married Caucasian male father 2, grandfather of 4 grandchildren whose primary care provider is Michael Herrera.  He is also cared for at the [facility] in [place].  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  His cardiac risk factors notable for treated hyperlipidemia but otherwise are negative.  He is never had a heart attack or stroke.  He did have stents to his RCA and LAD back in 2015 and bypass grafting by Dr. [name] January 2016.  He was admitted to [facility] with unstable angina 07/19/2018.  His enzymes were negative.  He underwent cardiac catheterization by myself 07/22/2018 revealing a patent vein to a diagonal, patent vein to the marginal branch, atretic LIMA to the LAD and a patent stent to the native RCA which was not bypassed with a 70% distal RCA stenosis at the crux.  His LV  function was normal.  I performed PCI and drug-eluting stenting of his left main into his LAD postdilated to 3.4 mm.  He has had no symptoms since and remains on dual antiplatelet therapy including aspirin and Plavix.  He walked 78 miles last week and typically walks 10 miles a day at 4.3 mph without symptoms.  His most recent lab work at the [facility] on 10/10/2018 revealed a total cholesterol of 110, LDL of 37 and HDL of 56.  The patient does not have symptoms concerning for COVID-19 infection (fever, chills, cough, or new shortness of breath).    Past Medical History:  Diagnosis Date   Anemia associated with acute blood loss 08/31/2014   Carotid artery occlusion 07/2014   1-39% bilateral plaque   Coronary atherosclerosis of native coronary artery 2001   s/p PCI with  3.0 x 32 Promus DES mLAD. Ca L main w/o sig obst, mod dz in mLCx. Moderate focal lesion in OM1. Mild to mod dz & patent stents in RCA.  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   Dyslipidemia    Edema extremities 08/31/2014   GERD (gastroesophageal reflux disease)    Hearing loss, left    History of blood transfusion 1956   Low back pain    Skin cancer of face    Past Surgical History:  Procedure Laterality Date  CORONARY ANGIOPLASTY WITH STENT PLACEMENT  2001; 10/29/2013   PCI of RCA "X 2"; PCI of LAD   CORONARY ARTERY BYPASS GRAFT N/A 08/17/2014   Procedure: CORONARY ARTERY BYPASS GRAFTING (CABG);  Surgeon: Melrose Nakayama, MD;  Location: [place];  Service: Open Heart Surgery;  Laterality: N/A;  Times three using left internal mammary artery and endoscopically harvested right saphenous vein   CORONARY STENT INTERVENTION N/A 07/22/2018   Procedure: CORONARY STENT INTERVENTION;  Surgeon: Lorretta Harp, MD;  Location: [place] CV LAB;  Service: Cardiovascular;  Laterality: N/A;   KNEE  ARTHROSCOPY Right 1980's   KNEE ARTHROSCOPY Left 2007; 2008   LEFT HEART CATH AND CORS/GRAFTS ANGIOGRAPHY N/A 07/22/2018   Procedure: LEFT HEART CATH AND CORS/GRAFTS ANGIOGRAPHY;  Surgeon: Lorretta Harp, MD;  Location: [place] CV LAB;  Service: Cardiovascular;  Laterality: N/A;   LEFT HEART CATHETERIZATION WITH CORONARY ANGIOGRAM N/A 10/29/2013   Procedure: LEFT HEART CATHETERIZATION WITH CORONARY ANGIOGRAM;  Surgeon: Jettie Booze, MD;  Location: [facility] CATH LAB;  Service: Cardiovascular;  Laterality: N/A;   LEFT HEART CATHETERIZATION WITH CORONARY ANGIOGRAM N/A 08/14/2014   Procedure: LEFT HEART CATHETERIZATION WITH CORONARY ANGIOGRAM;  Surgeon: Jettie Booze, MD;  Location: [facility] CATH LAB;  Service: Cardiovascular;  Laterality: N/A;   REFRACTIVE SURGERY Bilateral ?2007   SKIN CANCER EXCISION Left    "cheek"   TONSILLECTOMY AND ADENOIDECTOMY  ~ 1956     Current Meds  Medication Sig   aspirin 81 MG tablet Take 81 mg by mouth every evening.    atorvastatin (LIPITOR) 80 MG tablet Take 1 tablet (80 mg total) by mouth every evening.   clopidogrel (PLAVIX) 75 MG tablet Take 1 tablet (75 mg total) by mouth daily.   ezetimibe (ZETIA) 10 MG tablet Take 1 tablet (10 mg total) by mouth daily.   nitroGLYCERIN (NITROSTAT) 0.4 MG SL tablet Place 1 tablet (0.4 mg total) under the tongue every 5 (five) minutes as needed for chest pain.   Omega-3 Fatty Acids (FISH OIL) 1200 MG CAPS Take 1,200 mg by mouth every morning.   vitamin C (ASCORBIC ACID) 500 MG tablet Take 500 mg by mouth every morning.    vitamin E 400 UNIT capsule Take 400 Units by mouth daily.     Allergies:   Patient has no known allergies.   Social History   Tobacco Use   Smoking status: Never Smoker   Smokeless tobacco: Never Used  Substance Use Topics   Alcohol use: Yes    Alcohol/week: 3.0 standard drinks    Types: 3 Cans of beer per week    Comment: 1 beer 3 times weekly   Drug use: No      Family Hx: The patient's family history includes Dementia in his father.  ROS:   Please see the history of present illness.     All other systems reviewed and are negative.   Prior CV studies:   The following studies were reviewed today:  None  Labs/Other Tests and Data Reviewed:    EKG:  No ECG reviewed.  Recent Labs: 07/23/2018: BUN 14; Creatinine, Ser 1.18; Hemoglobin 13.2; Platelets 118; Potassium 4.0; Sodium 141   Recent Lipid Panel Lab Results  Component Value Date/Time   CHOL 97 07/20/2018 04:20 AM   TRIG 62 07/20/2018 04:20 AM   HDL 44 07/20/2018 04:20 AM   CHOLHDL 2.2 07/20/2018 04:20 AM   LDLCALC 41 07/20/2018 04:20 AM    Wt Readings from Last 3 Encounters:  11/27/18 142 lb (64.4 kg)  07/30/18 140 lb 6.4 oz (63.7 kg)  07/23/18 142 lb 6.7 oz (64.6 kg)     Objective:    Vital Signs:  Ht 5\' 9"  (1.753 m)    Wt 142 lb (64.4 kg)    BMI 20.97 kg/m    VITAL SIGNS:  reviewed RESPIRATORY:  normal respiratory effort, symmetric expansion MUSCULOSKELETAL:  no obvious deformities. NEURO:  alert and oriented x 3, no obvious focal deficit PSYCH:  normal affect  ASSESSMENT & PLAN:    1. Coronary artery disease- history of CAD status post RCA and LAD stenting in 2015, subsequent CABG by Dr. [name] January 2016, with admission for unstable angina 07/19/2018.  I performed cardiac catheterization and ultimately stented his left main into his LAD for an atretic LIMA.  His stent to his mid RCA was patent and he did have a 70% distal RCA stenosis at the crux which I elected to treat medically.  His LV function was normal.  He remains on dual antiplatelet therapy including aspirin Plavix.  Is very active walking 2 miles a day and is asymptomatic. 2. Hyperlipidemia- history of hyperlipidemia on atorvastatin 80, Zetia 10 with recent lipid profile performed 10/10/2018 at the [facility] revealing total cholesterol 110, LDL of 37 and HDL of 56.  COVID-19  Education: The signs and symptoms of COVID-19 were discussed with the patient and how to seek care for testing (follow up with PCP or arrange E-visit).  The importance of social distancing was discussed today.  Time:   Today, I have spent 15 minutes with the patient with telehealth technology discussing the above problems.     Medication Adjustments/Labs and Tests Ordered: Current medicines are reviewed at length with the patient today.  Concerns regarding medicines are outlined above.   Tests Ordered: No orders of the defined types were placed in this encounter.   Medication Changes: No orders of the defined types were placed in this encounter.   Disposition:  Follow up in 6 month(s)  Signed, Quay Burow, MD  11/27/2018 9:13 AM    [facility]

## 2018-11-27 NOTE — Telephone Encounter (Signed)
Patient and/or DPR-approved person aware of AVS instructions and verbalized understanding. Letter including After Visit Summary and any other necessary documents to be mailed to the patient's address on file by med records.

## 2019-01-31 DIAGNOSIS — D485 Neoplasm of uncertain behavior of skin: Secondary | ICD-10-CM | POA: Diagnosis not present

## 2019-01-31 DIAGNOSIS — L570 Actinic keratosis: Secondary | ICD-10-CM | POA: Diagnosis not present

## 2019-01-31 DIAGNOSIS — L821 Other seborrheic keratosis: Secondary | ICD-10-CM | POA: Diagnosis not present

## 2019-01-31 DIAGNOSIS — D225 Melanocytic nevi of trunk: Secondary | ICD-10-CM | POA: Diagnosis not present

## 2019-01-31 DIAGNOSIS — D1801 Hemangioma of skin and subcutaneous tissue: Secondary | ICD-10-CM | POA: Diagnosis not present

## 2019-01-31 DIAGNOSIS — Z85828 Personal history of other malignant neoplasm of skin: Secondary | ICD-10-CM | POA: Diagnosis not present

## 2019-01-31 DIAGNOSIS — D692 Other nonthrombocytopenic purpura: Secondary | ICD-10-CM | POA: Diagnosis not present

## 2019-01-31 DIAGNOSIS — L814 Other melanin hyperpigmentation: Secondary | ICD-10-CM | POA: Diagnosis not present

## 2019-04-05 ENCOUNTER — Other Ambulatory Visit: Payer: Self-pay | Admitting: Cardiology

## 2019-04-22 ENCOUNTER — Telehealth: Payer: Self-pay | Admitting: Cardiovascular Disease

## 2019-04-22 NOTE — Telephone Encounter (Signed)
Advised patient no mention of lab at last office visit but to come to appointment fasting and if Dr Gwenlyn Found wants labs can get then.

## 2019-04-22 NOTE — Telephone Encounter (Signed)
Patient wants to know if he will need new labs drawn before his upcoming appt on 10/14. He does not want to get billed for two office visits. He will come in a couple days before the appt if Dr. Gwenlyn Found wants to discuss the results with him, or he can come to his appt early. Please let him know what Dr. Gwenlyn Found prefers. Please call to discuss

## 2019-05-14 DIAGNOSIS — Z23 Encounter for immunization: Secondary | ICD-10-CM | POA: Diagnosis not present

## 2019-05-21 ENCOUNTER — Ambulatory Visit: Payer: PPO | Admitting: Cardiovascular Disease

## 2019-05-21 ENCOUNTER — Other Ambulatory Visit: Payer: Self-pay

## 2019-05-21 ENCOUNTER — Encounter: Payer: Self-pay | Admitting: Cardiovascular Disease

## 2019-05-21 VITALS — BP 128/70 | HR 70 | Temp 97.3°F | Ht 69.0 in | Wt 148.4 lb

## 2019-05-21 DIAGNOSIS — I2510 Atherosclerotic heart disease of native coronary artery without angina pectoris: Secondary | ICD-10-CM

## 2019-05-21 DIAGNOSIS — Z008 Encounter for other general examination: Secondary | ICD-10-CM

## 2019-05-21 DIAGNOSIS — E785 Hyperlipidemia, unspecified: Secondary | ICD-10-CM | POA: Diagnosis not present

## 2019-05-21 DIAGNOSIS — Z136 Encounter for screening for cardiovascular disorders: Secondary | ICD-10-CM

## 2019-05-21 NOTE — Assessment & Plan Note (Signed)
History of dyslipidemia on statin therapy with lipid profile performed 10/10/2018 at the [facility] revealing total cholesterol 110, LDL of 37 and HDL of 56.

## 2019-05-21 NOTE — Progress Notes (Signed)
05/21/2019 Michael Herrera   1951/04/25  [id number]  Primary Physician Orpah Melter, MD Primary Cardiologist: Lorretta Harp MD [name], [place]  HPI:  Michael Herrera is a 68 y.o.  fit appearing married Caucasian male father 40, grandfather of 4 grandchildren whose primary care provider is Dr. Christella Noa.  He is also cared for at the [facility] in [place].  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  I last saw him via telemedicine virtual video visit on 11/27/2018. His cardiac risk factors notable for treated hyperlipidemia but otherwise are negative.  He is never had a heart attack or stroke.  He did have stents to his RCA and LAD back in 2015 and bypass grafting by Dr. [name] January 2016.  He was admitted to [facility] with unstable angina 07/19/2018.  His enzymes were negative.  He underwent cardiac catheterization by myself 07/22/2018 revealing a patent vein to a diagonal, patent vein to the marginal branch, atretic LIMA to the LAD and a patent stent to the native RCA which was not bypassed with a 70% distal RCA stenosis at the crux.  His LV function was normal.  I performed PCI and drug-eluting stenting of his left main into his LAD postdilated to 3.4 mm.  He has had no symptoms since and remains on dual antiplatelet therapy including aspirin and Plavix.  He walked 78 miles last week and typically walks 10 miles a day at 4.3 mph without symptoms.  His most recent lab work at the [facility] on 10/10/2018 revealed a total cholesterol of 110, LDL of 37 and HDL of 56.  Since I saw him 6 months ago he continues to do well.  He exercises frequently and in large quantities and is completely asymptomatic denying chest pain or shortness of breath.   Current Meds  Medication Sig  . aspirin 81 MG tablet Take 81 mg by mouth every evening.   [name] atorvastatin (LIPITOR) 80 MG tablet Take 1 tablet (80 mg  total) by mouth every evening.  . clopidogrel (PLAVIX) 75 MG tablet TAKE 1 TABLET BY MOUTH EVERY DAY  . ezetimibe (ZETIA) 10 MG tablet Take 1 tablet (10 mg total) by mouth daily.  . nitroGLYCERIN (NITROSTAT) 0.4 MG SL tablet Place 1 tablet (0.4 mg total) under the tongue every 5 (five) minutes as needed for chest pain.  . Omega-3 Fatty Acids (FISH OIL) 1200 MG CAPS Take 1,200 mg by mouth every morning.  . vitamin C (ASCORBIC ACID) 500 MG tablet Take 500 mg by mouth every morning.   . vitamin E 400 UNIT capsule Take 400 Units by mouth daily.     No Known Allergies  Social History   Socioeconomic History  . Marital status: Married    Spouse name: Not on file  . Number of children: Not on file  . Years of education: Not on file  . Highest education level: Not on file  Occupational History  . Not on file  Social Needs  . Financial resource strain: Not on file  . Food insecurity    Worry: Not on file    Inability: Not on file  . Transportation needs    Medical: Not on file    Non-medical: Not on file  Tobacco Use  . Smoking status: Never Smoker  . Smokeless tobacco: Never Used  Substance and Sexual Activity  . Alcohol use:  Yes    Alcohol/week: 3.0 standard drinks    Types: 3 Cans of beer per week    Comment: 1 beer 3 times weekly  . Drug use: No  . Sexual activity: Yes  Lifestyle  . Physical activity    Days per week: Not on file    Minutes per session: Not on file  . Stress: Not on file  Relationships  . Social Herbalist on phone: Not on file    Gets together: Not on file    Attends religious service: Not on file    Active member of club or organization: Not on file    Attends meetings of clubs or organizations: Not on file    Relationship status: Not on file  . Intimate partner violence    Fear of current or ex partner: Not on file    Emotionally abused: Not on file    Physically abused: Not on file    Forced sexual activity: Not on file  Other Topics  Concern  . Not on file  Social History Narrative  . Not on file     Review of Systems: General: negative for chills, fever, night sweats or weight changes.  Cardiovascular: negative for chest pain, dyspnea on exertion, edema, orthopnea, palpitations, paroxysmal nocturnal dyspnea or shortness of breath Dermatological: negative for rash Respiratory: negative for cough or wheezing Urologic: negative for hematuria Abdominal: negative for nausea, vomiting, diarrhea, bright red blood per rectum, melena, or hematemesis Neurologic: negative for visual changes, syncope, or dizziness All other systems reviewed and are otherwise negative except as noted above.    Blood pressure 128/70, pulse 70, temperature (!) 97.3 F (36.3 C), height 5\' 9"  (1.753 m), weight 148 lb 6.4 oz (67.3 kg), SpO2 97 %.  General appearance: alert and no distress Neck: no adenopathy, no carotid bruit, no JVD, supple, symmetrical, trachea midline and thyroid not enlarged, symmetric, no tenderness/mass/nodules Lungs: clear to auscultation bilaterally Heart: regular rate and rhythm, S1, S2 normal, no murmur, click, rub or gallop Extremities: extremities normal, atraumatic, no cyanosis or edema Pulses: 2+ and symmetric Skin: Skin color, texture, turgor normal. No rashes or lesions Neurologic: Alert and oriented X 3, normal strength and tone. Normal symmetric reflexes. Normal coordination and gait  EKG sinus rhythm at 61 with septal Q waves.  I personally reviewed this EKG.  ASSESSMENT AND PLAN:   CAD (coronary artery disease) History of CAD status post stents to his RCA and LAD back in 2015 and subsequent CABG by Dr. [name] January 2016.  He was admitted with unstable angina 07/19/2018.  From cardiac catheterization on him 3 days later and ultimately stented his left main into his LAD because of the atretic LIMA.  The native RCA was on bypass and had a 70% distal lesion at the crux and the vein to the diagonal marginal  branches were patent.  He has had no recurrent chest pain and remains on dual antiplatelet therapy.  Dyslipidemia History of dyslipidemia on statin therapy with lipid profile performed 10/10/2018 at the [facility] revealing total cholesterol 110, LDL of 37 and HDL of 56.      Lorretta Harp MD FACP,FACC,FAHA, [facility] 05/21/2019 10:32 AM

## 2019-05-21 NOTE — Patient Instructions (Addendum)
Medication Instructions:  Your physician recommends that you continue on your current medications as directed. Please refer to the Current Medication list given to you today.  If you need a refill on your cardiac medications before your next appointment, please call your pharmacy.   Lab work: none If you have labs (blood work) drawn today and your tests are completely normal, you will receive your results only by: [name] MyChart Message (if you have MyChart) OR . A paper copy in the mail If you have any lab test that is abnormal or we need to change your treatment, we will call you to review the results.  Testing/Procedures: Your physician has requested that you have a carotid duplex. This test is an ultrasound of the carotid arteries in your neck. It looks at blood flow through these arteries that supply the brain with blood. Allow one hour for this exam. There are no restrictions or special instructions.   Follow-Up: At [facility], you and your health needs are our priority.  As part of our continuing mission to provide you with exceptional heart care, we have created designated Provider Care Teams.  These Care Teams include your primary Cardiologist (physician) and Advanced Practice Providers (APPs -  Physician Assistants and Nurse Practitioners) who all work together to provide you with the care you need, when you need it. You will need a follow up appointment in 12 months with Dr. Quay Burow.  Please call our office 2 months in advance to schedule this appointment.

## 2019-05-21 NOTE — Assessment & Plan Note (Signed)
History of CAD status post stents to his RCA and LAD back in 2015 and subsequent CABG by Dr. [name] January 2016.  He was admitted with unstable angina 07/19/2018.  From cardiac catheterization on him 3 days later and ultimately stented his left main into his LAD because of the atretic LIMA.  The native RCA was on bypass and had a 70% distal lesion at the crux and the vein to the diagonal marginal branches were patent.  He has had no recurrent chest pain and remains on dual antiplatelet therapy.

## 2019-05-29 ENCOUNTER — Other Ambulatory Visit: Payer: Self-pay

## 2019-05-29 ENCOUNTER — Ambulatory Visit (HOSPITAL_COMMUNITY)
Admission: RE | Admit: 2019-05-29 | Discharge: 2019-05-29 | Disposition: A | Discharge status: Home / Self Care | Payer: PPO | Source: Ambulatory Visit | Attending: Cardiology | Admitting: Cardiology

## 2019-05-29 ENCOUNTER — Other Ambulatory Visit: Payer: Self-pay | Admitting: Cardiovascular Disease

## 2019-05-29 ENCOUNTER — Encounter: Payer: Self-pay | Admitting: *Deleted

## 2019-05-29 DIAGNOSIS — Z136 Encounter for screening for cardiovascular disorders: Secondary | ICD-10-CM | POA: Insufficient documentation

## 2019-05-29 DIAGNOSIS — I6523 Occlusion and stenosis of bilateral carotid arteries: Secondary | ICD-10-CM | POA: Diagnosis not present

## 2019-06-07 IMAGING — DX DG CHEST 2V
2 series · 2 of 2 positions shown · non-contrast
Comparison: Chest x-ray dated November 17, 2014.

CLINICAL DATA: Chest pain.

EXAM:
CHEST - 2 VIEW

[chest pa]
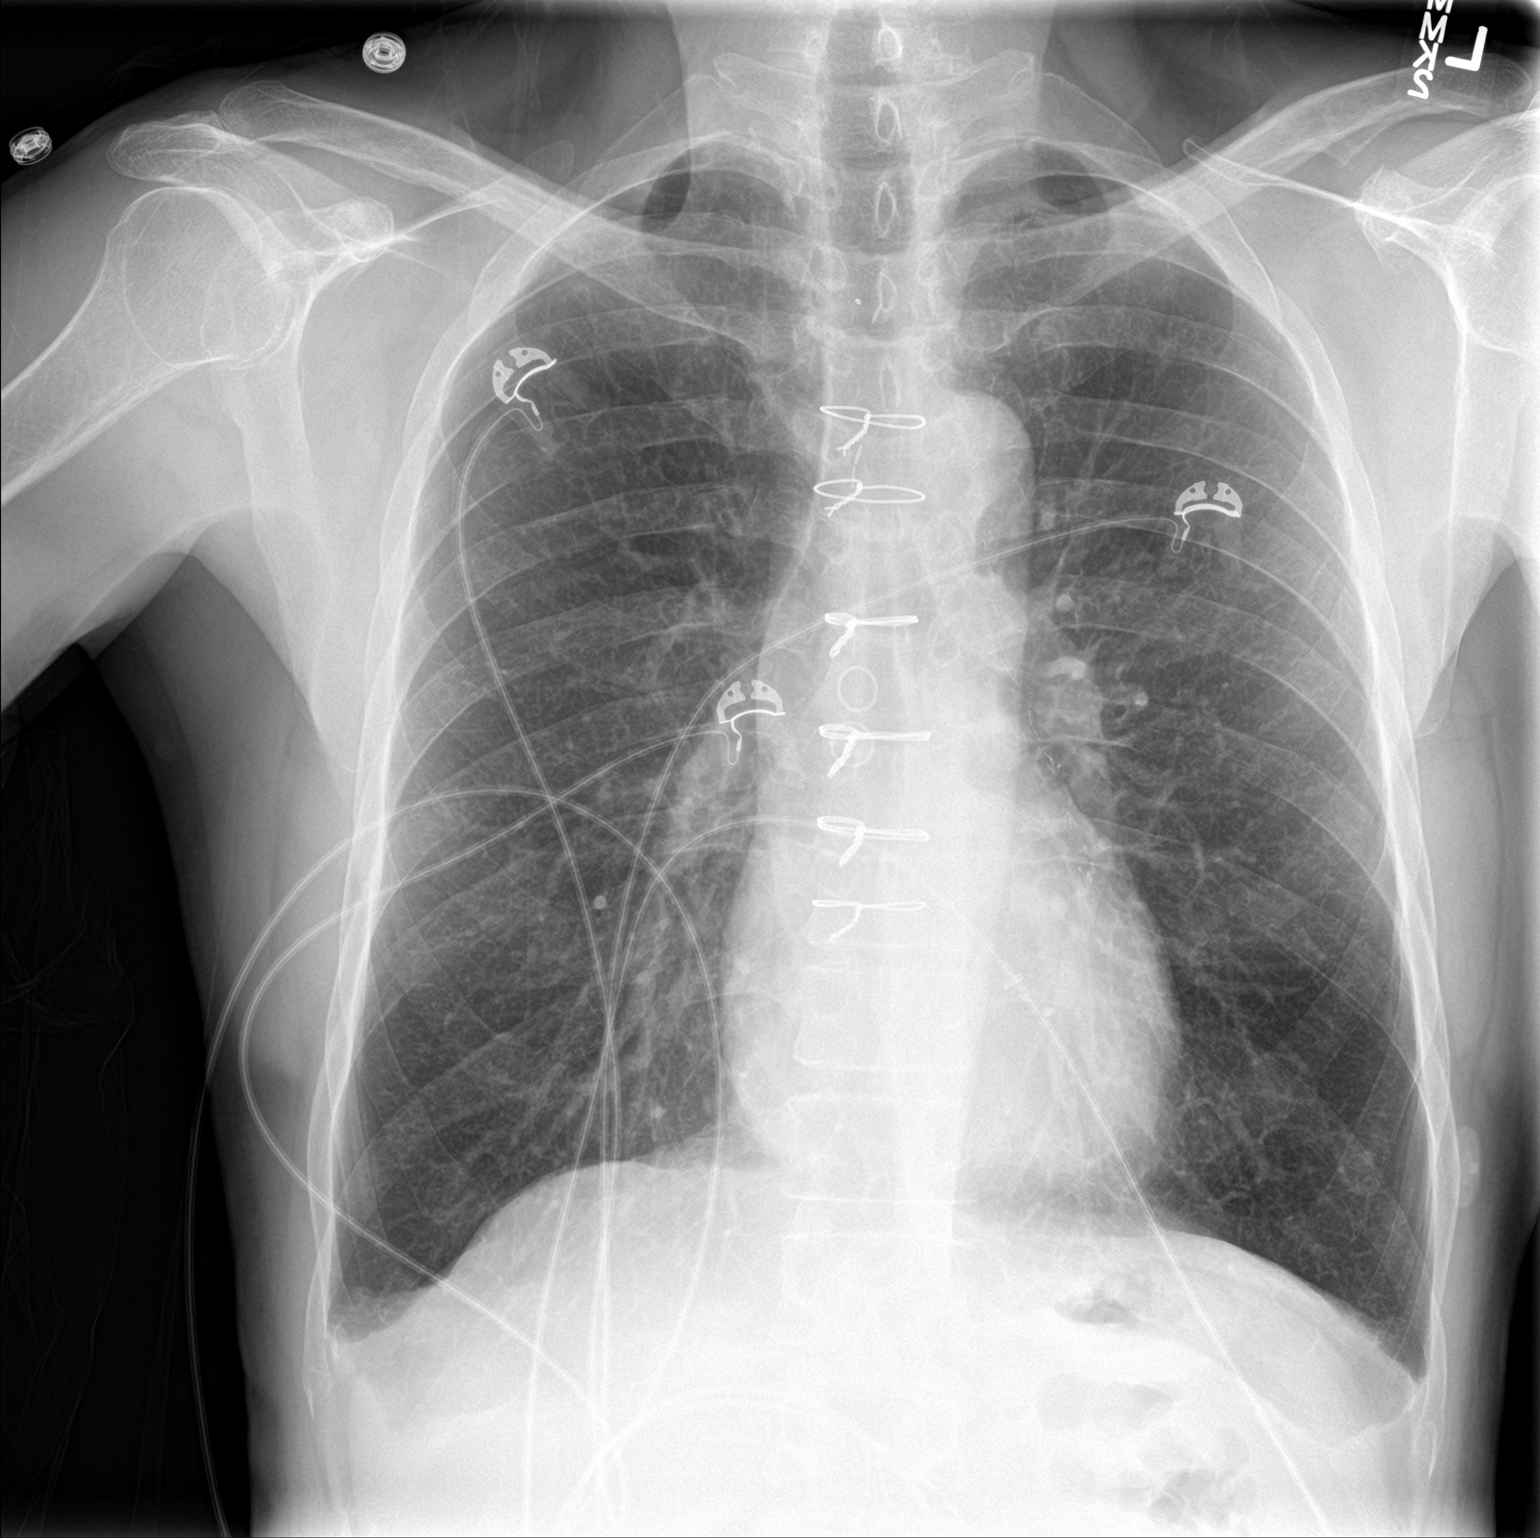

[chest lat]
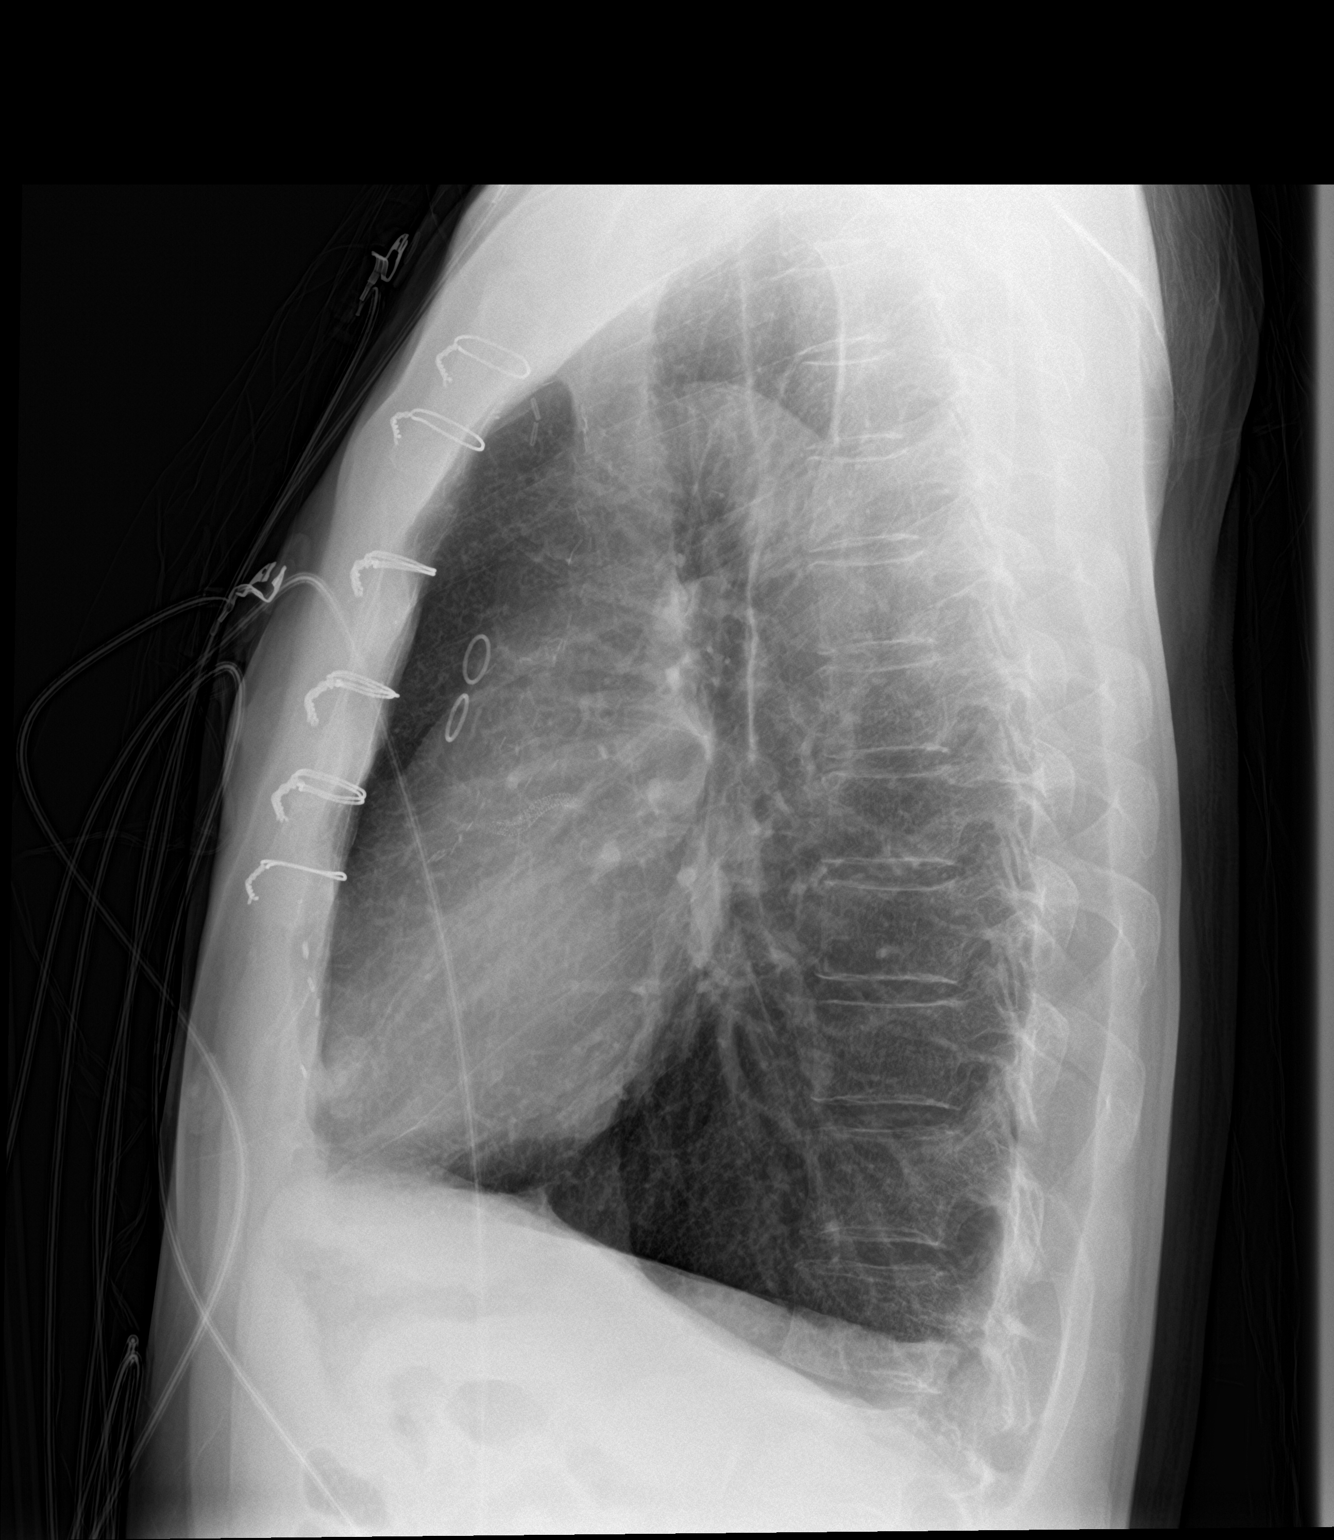

[2 of 2 positions shown; findings below may reference images not displayed]

FINDINGS: The heart size and mediastinal contours are within normal limits.
Prior CABG. Normal pulmonary vascularity. No focal consolidation,
pleural effusion, or pneumothorax. No acute osseous abnormality.
IMPRESSION: No active cardiopulmonary disease.

## 2019-06-24 ENCOUNTER — Other Ambulatory Visit: Payer: Self-pay

## 2019-06-24 DIAGNOSIS — Z20822 Contact with and (suspected) exposure to covid-19: Secondary | ICD-10-CM

## 2019-06-25 LAB — NOVEL CORONAVIRUS, NAA: SARS-CoV-2, NAA: NOT DETECTED

## 2019-09-11 ENCOUNTER — Telehealth: Payer: Self-pay | Admitting: Cardiovascular Disease

## 2019-09-11 NOTE — Telephone Encounter (Signed)
We are recommending the COVID-19 vaccine to all of our patients. Cardiac medications (including blood thinners) should not deter anyone from being vaccinated and there is no need to hold any of those medications prior to vaccine administration.     Currently, there is a hotline to call (active 08/15/19) to schedule vaccination appointments as no walk-ins will be accepted.   Number: [phone].    If an appointment is not available please go to [email or web address] to sign up for notification when additional vaccine appointments are available.   If you have further questions or concerns about the vaccine process, please visit [email or web address] or contact your primary care physician.  I have informed patient of the instructions. 

## 2019-11-24 DIAGNOSIS — H109 Unspecified conjunctivitis: Secondary | ICD-10-CM | POA: Diagnosis not present

## 2019-12-09 DIAGNOSIS — H0011 Chalazion right upper eyelid: Secondary | ICD-10-CM | POA: Diagnosis not present

## 2019-12-09 DIAGNOSIS — H0015 Chalazion left lower eyelid: Secondary | ICD-10-CM | POA: Diagnosis not present

## 2019-12-17 ENCOUNTER — Other Ambulatory Visit: Payer: Self-pay

## 2019-12-17 MED ORDER — CLOPIDOGREL BISULFATE 75 MG PO TABS: 75.0000 mg | ORAL_TABLET | Freq: Every day | ORAL | 1 refills | Status: DC

## 2020-01-12 ENCOUNTER — Telehealth: Payer: Self-pay | Admitting: Cardiovascular Disease

## 2020-01-12 DIAGNOSIS — H01004 Unspecified blepharitis left upper eyelid: Secondary | ICD-10-CM | POA: Diagnosis not present

## 2020-01-12 DIAGNOSIS — H25013 Cortical age-related cataract, bilateral: Secondary | ICD-10-CM | POA: Diagnosis not present

## 2020-01-12 DIAGNOSIS — H01001 Unspecified blepharitis right upper eyelid: Secondary | ICD-10-CM | POA: Diagnosis not present

## 2020-01-12 NOTE — Telephone Encounter (Signed)
Needs to see me in the office tomorrow afternoon

## 2020-01-12 NOTE — Telephone Encounter (Signed)
Appt made with Dr. Gwenlyn Found per his approval for 01/13/20.

## 2020-01-12 NOTE — Telephone Encounter (Signed)
New Message   The only symptoms he is having right now is teeth and jaw pain. Pt used a nitroglycerin last night around 730pm because he was having a hard time getting his air, sob, lethargic  Pts wife says pt will not go to [place] [facility]  Pts wife says he has had no chest pain    Please advise

## 2020-01-12 NOTE — Telephone Encounter (Signed)
Pt and his wife called to report th pt has been having jaw, teeth, and left shoulder pain since last night that was relieved with nitro but the pt is having it again this morning. He is not having chest pain but he is SOB and cannot take a deep breath. She says he is very lethargic and does not feel well. She says he refuses to go to the ED as he has had "bad " experiences there in the past.   She is asking for me to set him up with a Cath but I advised her that he needs immediate assessment but they both say that he has an eye appt this morning that he cannot cancel and requested another day with Dr. Gwenlyn Found but he is unavailable until [facility] 01/14/20.[name] so they made an appt with Roby Lofts PA 01/13/20.   I strongly urged him not to drive and if his symptoms worsen to please call EMS... they reported they will not go to the ED under any circumstances.. the pt says eh would rather die.   I will forward to Dr. Gwenlyn Found for review.

## 2020-01-13 ENCOUNTER — Encounter: Payer: Self-pay | Admitting: Cardiovascular Disease

## 2020-01-13 ENCOUNTER — Ambulatory Visit: Payer: PPO | Admitting: Cardiovascular Disease

## 2020-01-13 ENCOUNTER — Other Ambulatory Visit: Payer: Self-pay

## 2020-01-13 ENCOUNTER — Ambulatory Visit: Payer: PPO | Admitting: Cardiology

## 2020-01-13 DIAGNOSIS — E785 Hyperlipidemia, unspecified: Secondary | ICD-10-CM

## 2020-01-13 DIAGNOSIS — I25118 Atherosclerotic heart disease of native coronary artery with other forms of angina pectoris: Secondary | ICD-10-CM | POA: Diagnosis not present

## 2020-01-13 NOTE — H&P (View-Only) (Signed)
01/13/2020 Michael Herrera   1951-06-18  [id number]  Primary Physician Orpah Melter, MD Primary Cardiologist: Lorretta Harp MD [name], [place]  HPI:  Michael Herrera is a 69 y.o.  [name]  fit appearing married Caucasian male father 2, grandfather of 4 grandchildren whose primary care provider is Dr. Christella Noa. He is also cared for at the [facility] in [place]. 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  I last saw him in the office 05/21/2019. His cardiac risk factors notable for treated hyperlipidemia but otherwise are negative. He is never had a heart attack or stroke. He did have stents to his RCA and LAD back in 2015 and bypass grafting by Dr. [name] January 2016. He was admitted to [facility] with unstable angina 07/19/2018. His enzymes were negative. He underwent cardiac catheterization by myself 07/22/2018 revealing a patent vein to a diagonal, patent vein to the marginal branch, atretic LIMA to the LAD and a patent stent to the native RCA which was not bypassed with a 70% distal RCA stenosis at the crux. His LV function was normal. I performed PCI and drug-eluting stenting of his left main into his LAD postdilated to 3.4 mm. He has had no symptoms since and remains on dual antiplatelet therapy including aspirin and Plavix. He walked 78 miles last week and typically walks 10 miles a day at 4.3 mph without symptoms. His most recent lab work at the [facility] revealed a total cholesterol of less than 100 with an LDL in the 30 range.Since I saw him 6 months ago he continues to do well.  He exercises frequently and in large quantities and is completely asymptomatic denying chest pain or shortness of breath.  Since I saw him 8 months ago he continues to do well walking 10 miles a day until this past Saturday when he started noticed fatigue, chest pain rating to his jaw and back consistent  with his angina.  He took a symptom nitroglycerin which resulted in some relief.    Current Meds  Medication Sig  . aspirin 81 MG tablet Take 81 mg by mouth every evening.   [name] atorvastatin (LIPITOR) 80 MG tablet Take 1 tablet (80 mg total) by mouth every evening.  . clopidogrel (PLAVIX) 75 MG tablet Take 1 tablet (75 mg total) by mouth daily.  [name] ezetimibe (ZETIA) 10 MG tablet Take 1 tablet (10 mg total) by mouth daily.  . nitroGLYCERIN (NITROSTAT) 0.4 MG SL tablet Place 1 tablet (0.4 mg total) under the tongue every 5 (five) minutes as needed for chest pain.  . Omega-3 Fatty Acids (FISH OIL) 1200 MG CAPS Take 1,200 mg by mouth every morning.  . vitamin C (ASCORBIC ACID) 500 MG tablet Take 500 mg by mouth every morning.   . vitamin E 400 UNIT capsule Take 400 Units by mouth daily.     No Known Allergies  Social History   Socioeconomic History  . Marital status: Married    Spouse name: Not on file  . Number of children: Not on file  . Years of education: Not on file  . Highest education level: Not on file  Occupational History  . Not on file  Tobacco Use  . Smoking status: Never Smoker  . Smokeless tobacco: Never Used  Substance and Sexual Activity  . Alcohol use: Yes    Alcohol/week: 3.0 standard drinks  Types: 3 Cans of beer per week    Comment: 1 beer 3 times weekly  . Drug use: No  . Sexual activity: Yes  Other Topics Concern  . Not on file  Social History Narrative  . Not on file   Social Determinants of Health   Financial Resource Strain:   . Difficulty of Paying Living Expenses:   Food Insecurity:   . Worried About Charity fundraiser in the Last Year:   . Arboriculturist in the Last Year:   Transportation Needs:   . Film/video editor (Medical):   [name] Lack of Transportation (Non-Medical):   Physical Activity:   . Days of Exercise per Week:   . Minutes of Exercise per Session:   Stress:   . Feeling of Stress :   Social Connections:   . Frequency of  Communication with Friends and Family:   . Frequency of Social Gatherings with Friends and Family:   . Attends Religious Services:   . Active Member of Clubs or Organizations:   . Attends Archivist Meetings:   [name] Marital Status:   Intimate Partner Violence:   . Fear of Current or Ex-Partner:   . Emotionally Abused:   [name] Physically Abused:   . Sexually Abused:      Review of Systems: General: negative for chills, fever, night sweats or weight changes.  Cardiovascular: negative for chest pain, dyspnea on exertion, edema, orthopnea, palpitations, paroxysmal nocturnal dyspnea or shortness of breath Dermatological: negative for rash Respiratory: negative for cough or wheezing Urologic: negative for hematuria Abdominal: negative for nausea, vomiting, diarrhea, bright red blood per rectum, melena, or hematemesis Neurologic: negative for visual changes, syncope, or dizziness All other systems reviewed and are otherwise negative except as noted above.    Blood pressure (!) 148/76, pulse 63, height 5\' 9"  (1.753 m), weight 140 lb (63.5 kg).  General appearance: alert and no distress Neck: no adenopathy, no carotid bruit, no JVD, supple, symmetrical, trachea midline and thyroid not enlarged, symmetric, no tenderness/mass/nodules Lungs: clear to auscultation bilaterally Heart: regular rate and rhythm, S1, S2 normal, no murmur, click, rub or gallop Extremities: extremities normal, atraumatic, no cyanosis or edema Pulses: 2+ and symmetric Skin: Skin color, texture, turgor normal. No rashes or lesions Neurologic: Alert and oriented X 3, normal strength and tone. Normal symmetric reflexes. Normal coordination and gait  EKG sinus rhythm at 63 without ST or T wave changes.  I personally reviewed this EKG.  ASSESSMENT AND PLAN:   CAD (coronary artery disease) History of CAD status post stents to his RCA and LAD back in 2015 and ultimate undergoing CABG by Dr. [name] 2016.  I  catheterized him 07/22/2018 in the setting of unstable angina revealing an atretic LIMA to the LAD, patent vein to diagonal branch, obtuse marginal branch, patent stent to the mid right and proximal LAD.  He had a 70% distal RCA stenosis at the crux and 80% left main stenosis which I ended up stenting with an excellent result.  His symptoms resolved until this past Saturday when he started develop weakness, chest pain and jaw pain which is his anginal equivalent.  He is otherwise very active and walks up to 70 to 80 miles a week.  Based on this I decided to proceed with outpatient diagnostic coronary angiography. The patient understands that risks included but are not limited to stroke (1 in 1000), death (1 in 46), kidney failure [usually temporary] (1 in 500), bleeding (1  in 200), allergic reaction [possibly serious] (1 in 200). The patient understands and agrees to proceed  Dyslipidemia History of dyslipidemia on statin therapy with lipid profile performed by the [facility] recently showed a total cholesterol of 90 and LDL of less than 30.      Lorretta Harp MD FACP,FACC,FAHA, [facility] 01/13/2020 1:35 PM

## 2020-01-13 NOTE — Patient Instructions (Signed)
Medication Instructions:  The current medical regimen is effective;  continue present plan and medications.  *If you need a refill on your cardiac medications before your next appointment, please call your pharmacy*   Lab Work: CBC, BMET today   If you have labs (blood work) drawn today and your tests are completely normal, you will receive your results only by: [name] MyChart Message (if you have MyChart) OR . A paper copy in the mail If you have any lab test that is abnormal or we need to change your treatment, we will call you to review the results.   Testing/Procedures:  Your physician has requested that you have a cardiac catheterization. Cardiac catheterization is used to diagnose and/or treat various heart conditions. Doctors may recommend this procedure for a number of different reasons. The most common reason is to evaluate chest pain. Chest pain can be a symptom of coronary artery disease (CAD), and cardiac catheterization can show whether plaque is narrowing or blocking your heart's arteries. This procedure is also used to evaluate the valves, as well as measure the blood flow and oxygen levels in different parts of your heart. For further information please visit [email or web address]. Please follow instruction sheet, as given.   Follow-Up: At [facility], you and your health needs are our priority.  As part of our continuing mission to provide you with exceptional heart care, we have created designated Provider Care Teams.  These Care Teams include your primary Cardiologist (physician) and Advanced Practice Providers (APPs -  Physician Assistants and Nurse Practitioners) who all work together to provide you with the care you need, when you need it.  We recommend signing up for the patient portal called "MyChart".  Sign up information is provided on this After Visit Summary.  MyChart is used to connect with patients for Virtual Visits (Telemedicine).  Patients are able to view lab/test  results, encounter notes, upcoming appointments, etc.  Non-urgent messages can be sent to your provider as well.   To learn more about what you can do with MyChart, go to [email or web address].    Your next appointment:   2 week(s)  The format for your next appointment:   In Person  Provider:   Quay Burow, MD   Other Instructions     [address] [place] [address] Dept: [phone] Loc: [place]  01/13/2020  You are scheduled for a Cardiac Catheterization on Thursday, June 10 with Dr. Quay Burow.  1. Please arrive at the [facility] (Main Entrance A) at [facility]: [address] [place], [place] [address] at 7:30 AM (This time is two hours before your procedure to ensure your preparation). Free valet parking service is available.   Special note: Every effort is made to have your procedure done on time. Please understand that emergencies sometimes delay scheduled procedures.  2. Diet: Do not eat solid foods after midnight.  The patient may have clear liquids until 5am upon the day of the procedure.  3. Labs: You will need to have blood drawn today, BMET, CBC. You do not need to be fasting.  4. Medication instructions in preparation for your procedure:   Contrast Allergy: No  On the morning of your procedure, take your Plavix/Clopidogrel and Aspirin and any morning medicines NOT listed above.  You may use sips of water.  5. Plan for one night stay--bring personal belongings. 6. Bring a current list  of your medications and current insurance cards. 7. You MUST have a responsible person to drive you home. 8. Someone MUST be with you the first 24 hours after you arrive home or your discharge will be delayed. 9. Please wear clothes that are easy to get on and off and wear slip-on shoes.  Thank you for allowing [place] to care for you!   --  [facility]

## 2020-01-13 NOTE — Assessment & Plan Note (Signed)
History of CAD status post stents to his RCA and LAD back in 2015 and ultimate undergoing CABG by Dr. [name] 2016.  I catheterized him 07/22/2018 in the setting of unstable angina revealing an atretic LIMA to the LAD, patent vein to diagonal branch, obtuse marginal branch, patent stent to the mid right and proximal LAD.  He had a 70% distal RCA stenosis at the crux and 80% left main stenosis which I ended up stenting with an excellent result.  His symptoms resolved until this past Saturday when he started develop weakness, chest pain and jaw pain which is his anginal equivalent.  He is otherwise very active and walks up to 70 to 80 miles a week.  Based on this I decided to proceed with outpatient diagnostic coronary angiography. The patient understands that risks included but are not limited to stroke (1 in 1000), death (1 in 40), kidney failure [usually temporary] (1 in 500), bleeding (1 in 200), allergic reaction [possibly serious] (1 in 200). The patient understands and agrees to proceed

## 2020-01-13 NOTE — Assessment & Plan Note (Signed)
History of dyslipidemia on statin therapy with lipid profile performed by the [facility] recently showed a total cholesterol of 90 and LDL of less than 30.

## 2020-01-13 NOTE — Progress Notes (Signed)
01/13/2020 Michael Herrera   05-10-1951  [id number]  Primary Physician Michael Melter, MD Primary Cardiologist: Michael Harp MD Michael Herrera, [place]  HPI:  Michael Herrera is a 69 y.o.  Michael Herrera  fit appearing married Caucasian male father 2, grandfather of 4 grandchildren whose primary care provider is Dr. Christella Herrera. He is also cared for at the [facility] in [place]. 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  I last saw him in the office 05/21/2019. His cardiac risk factors notable for treated hyperlipidemia but otherwise are negative. He is never had a heart attack or stroke. He did have stents to his RCA and LAD back in 2015 and bypass grafting by Dr. Roxan Herrera January 2016. He was admitted to [facility] with unstable angina 07/19/2018. His enzymes were negative. He underwent cardiac catheterization by myself 07/22/2018 revealing a patent vein to a diagonal, patent vein to the marginal branch, atretic LIMA to the LAD and a patent stent to the native RCA which was not bypassed with a 70% distal RCA stenosis at the crux. His LV function was normal. I performed PCI and drug-eluting stenting of his left main into his LAD postdilated to 3.4 mm. He has had no symptoms since and remains on dual antiplatelet therapy including aspirin and Plavix. He walked 78 miles last week and typically walks 10 miles a day at 4.3 mph without symptoms. His most recent lab work at the [facility] revealed a total cholesterol of less than 100 with an LDL in the 30 range.Since I saw him 6 months ago he continues to do well.  He exercises frequently and in large quantities and is completely asymptomatic denying chest pain or shortness of breath.  Since I saw him 8 months ago he continues to do well walking 10 miles a day until this past Saturday when he started noticed fatigue, chest pain rating to his jaw and back consistent  with his angina.  He took a symptom nitroglycerin which resulted in some relief.    Current Meds  Medication Sig  . aspirin 81 MG tablet Take 81 mg by mouth every evening.   Michael Herrera atorvastatin (LIPITOR) 80 MG tablet Take 1 tablet (80 mg total) by mouth every evening.  . clopidogrel (PLAVIX) 75 MG tablet Take 1 tablet (75 mg total) by mouth daily.  Michael Herrera ezetimibe (ZETIA) 10 MG tablet Take 1 tablet (10 mg total) by mouth daily.  . nitroGLYCERIN (NITROSTAT) 0.4 MG SL tablet Place 1 tablet (0.4 mg total) under the tongue every 5 (five) minutes as needed for chest pain.  . Omega-3 Fatty Acids (FISH OIL) 1200 MG CAPS Take 1,200 mg by mouth every morning.  . vitamin C (ASCORBIC ACID) 500 MG tablet Take 500 mg by mouth every morning.   . vitamin E 400 UNIT capsule Take 400 Units by mouth daily.     No Known Allergies  Social History   Socioeconomic History  . Marital status: Married    Spouse name: Not on file  . Number of children: Not on file  . Years of education: Not on file  . Highest education level: Not on file  Occupational History  . Not on file  Tobacco Use  . Smoking status: Never Smoker  . Smokeless tobacco: Never Used  Substance and Sexual Activity  . Alcohol use: Yes    Alcohol/week: 3.0 standard drinks  Types: 3 Cans of beer per week    Comment: 1 beer 3 times weekly  . Drug use: No  . Sexual activity: Yes  Other Topics Concern  . Not on file  Social History Narrative  . Not on file   Social Determinants of Health   Financial Resource Strain:   . Difficulty of Paying Living Expenses:   Food Insecurity:   . Worried About Charity fundraiser in the Last Year:   . Arboriculturist in the Last Year:   Transportation Needs:   . Film/video editor (Medical):   Michael Herrera Lack of Transportation (Non-Medical):   Physical Activity:   . Days of Exercise per Week:   . Minutes of Exercise per Session:   Stress:   . Feeling of Stress :   Social Connections:   . Frequency of  Communication with Friends and Family:   . Frequency of Social Gatherings with Friends and Family:   . Attends Religious Services:   . Active Member of Clubs or Organizations:   . Attends Archivist Meetings:   Michael Herrera Marital Status:   Intimate Partner Violence:   . Fear of Current or Ex-Partner:   . Emotionally Abused:   Michael Herrera Physically Abused:   . Sexually Abused:      Review of Systems: General: negative for chills, fever, night sweats or weight changes.  Cardiovascular: negative for chest pain, dyspnea on exertion, edema, orthopnea, palpitations, paroxysmal nocturnal dyspnea or shortness of breath Dermatological: negative for rash Respiratory: negative for cough or wheezing Urologic: negative for hematuria Abdominal: negative for nausea, vomiting, diarrhea, bright red blood per rectum, melena, or hematemesis Neurologic: negative for visual changes, syncope, or dizziness All other systems reviewed and are otherwise negative except as noted above.    Blood pressure (!) 148/76, pulse 63, height 5\' 9"  (1.753 m), weight 140 lb (63.5 kg).  General appearance: alert and no distress Neck: no adenopathy, no carotid bruit, no JVD, supple, symmetrical, trachea midline and thyroid not enlarged, symmetric, no tenderness/mass/nodules Lungs: clear to auscultation bilaterally Heart: regular rate and rhythm, S1, S2 normal, no murmur, click, rub or gallop Extremities: extremities normal, atraumatic, no cyanosis or edema Pulses: 2+ and symmetric Skin: Skin color, texture, turgor normal. No rashes or lesions Neurologic: Alert and oriented X 3, normal strength and tone. Normal symmetric reflexes. Normal coordination and gait  EKG sinus rhythm at 63 without ST or T wave changes.  I personally reviewed this EKG.  ASSESSMENT AND PLAN:   CAD (coronary artery disease) History of CAD status post stents to his RCA and LAD back in 2015 and ultimate undergoing CABG by Dr. Roxan Herrera 2016.  I  catheterized him 07/22/2018 in the setting of unstable angina revealing an atretic LIMA to the LAD, patent vein to diagonal branch, obtuse marginal branch, patent stent to the mid right and proximal LAD.  He had a 70% distal RCA stenosis at the crux and 80% left main stenosis which I ended up stenting with an excellent result.  His symptoms resolved until this past Saturday when he started develop weakness, chest pain and jaw pain which is his anginal equivalent.  He is otherwise very active and walks up to 70 to 80 miles a week.  Based on this I decided to proceed with outpatient diagnostic coronary angiography. The patient understands that risks included but are not limited to stroke (1 in 1000), death (1 in 27), kidney failure [usually temporary] (1 in 500), bleeding (1  in 200), allergic reaction [possibly serious] (1 in 200). The patient understands and agrees to proceed  Dyslipidemia History of dyslipidemia on statin therapy with lipid profile performed by the [facility] recently showed a total cholesterol of 90 and LDL of less than 30.      Michael Harp MD FACP,FACC,FAHA, [facility] 01/13/2020 1:35 PM

## 2020-01-14 ENCOUNTER — Other Ambulatory Visit: Payer: Self-pay | Admitting: *Deleted

## 2020-01-14 DIAGNOSIS — R072 Precordial pain: Secondary | ICD-10-CM

## 2020-01-14 LAB — CBC
Hematocrit: 47.7 % (ref 37.5–51.0)
Hemoglobin: 16.3 g/dL (ref 13.0–17.7)
MCH: 32.1 pg (ref 26.6–33.0)
MCHC: 34.2 g/dL (ref 31.5–35.7)
MCV: 94 fL (ref 79–97)
Platelets: 186 10*3/uL (ref 150–450)
RBC: 5.08 x10E6/uL (ref 4.14–5.80)
RDW: 12.3 % (ref 11.6–15.4)
WBC: 5.9 10*3/uL (ref 3.4–10.8)

## 2020-01-14 LAB — BASIC METABOLIC PANEL
BUN/Creatinine Ratio: 28 — ABNORMAL HIGH (ref 10–24)
BUN: 27 mg/dL (ref 8–27)
CO2: 26 mmol/L (ref 20–29)
Calcium: 10.1 mg/dL (ref 8.6–10.2)
Chloride: 100 mmol/L (ref 96–106)
Creatinine, Ser: 0.97 mg/dL (ref 0.76–1.27)
GFR calc Af Amer: 91 mL/min/{1.73_m2} (ref >59)
GFR calc non Af Amer: 79 mL/min/{1.73_m2} (ref >59)
Glucose: 79 mg/dL (ref 65–99)
Potassium: 5.2 mmol/L (ref 3.5–5.2)
Sodium: 139 mmol/L (ref 134–144)

## 2020-01-14 MED ORDER — SODIUM CHLORIDE 0.9% FLUSH: 3.0000 mL | Freq: Two times a day (BID) | INTRAVENOUS | Status: AC

## 2020-01-15 ENCOUNTER — Encounter (HOSPITAL_COMMUNITY): Payer: Self-pay | Admitting: Cardiovascular Disease

## 2020-01-15 ENCOUNTER — Encounter (HOSPITAL_COMMUNITY)
Admission: RE | Disposition: A | Discharge status: Home / Self Care | Payer: Self-pay | Source: Home / Self Care | Attending: Cardiovascular Disease

## 2020-01-15 ENCOUNTER — Other Ambulatory Visit: Payer: Self-pay

## 2020-01-15 ENCOUNTER — Ambulatory Visit (HOSPITAL_COMMUNITY)
Admission: RE | Admit: 2020-01-15 | Discharge: 2020-01-15 | Disposition: A | Discharge status: Home / Self Care | Payer: PPO | Attending: Cardiovascular Disease | Admitting: Cardiovascular Disease

## 2020-01-15 DIAGNOSIS — Z7902 Long term (current) use of antithrombotics/antiplatelets: Secondary | ICD-10-CM | POA: Insufficient documentation

## 2020-01-15 DIAGNOSIS — R072 Precordial pain: Secondary | ICD-10-CM

## 2020-01-15 DIAGNOSIS — E785 Hyperlipidemia, unspecified: Secondary | ICD-10-CM | POA: Insufficient documentation

## 2020-01-15 DIAGNOSIS — Z79899 Other long term (current) drug therapy: Secondary | ICD-10-CM | POA: Diagnosis not present

## 2020-01-15 DIAGNOSIS — Z955 Presence of coronary angioplasty implant and graft: Secondary | ICD-10-CM | POA: Diagnosis not present

## 2020-01-15 DIAGNOSIS — I2510 Atherosclerotic heart disease of native coronary artery without angina pectoris: Secondary | ICD-10-CM | POA: Diagnosis not present

## 2020-01-15 DIAGNOSIS — R079 Chest pain, unspecified: Secondary | ICD-10-CM | POA: Diagnosis present

## 2020-01-15 DIAGNOSIS — I25119 Atherosclerotic heart disease of native coronary artery with unspecified angina pectoris: Secondary | ICD-10-CM | POA: Diagnosis not present

## 2020-01-15 DIAGNOSIS — Z951 Presence of aortocoronary bypass graft: Secondary | ICD-10-CM | POA: Diagnosis not present

## 2020-01-15 DIAGNOSIS — Z7982 Long term (current) use of aspirin: Secondary | ICD-10-CM | POA: Insufficient documentation

## 2020-01-15 HISTORY — PX: LEFT HEART CATH AND CORS/GRAFTS ANGIOGRAPHY: CATH118250

## 2020-01-15 SURGERY — LEFT HEART CATH AND CORS/GRAFTS ANGIOGRAPHY
Anesthesia: LOCAL

## 2020-01-15 MED ORDER — CLOPIDOGREL BISULFATE 75 MG PO TABS: 75.0000 mg | ORAL_TABLET | Freq: Every day | ORAL | Status: DC

## 2020-01-15 MED ORDER — LIDOCAINE HCL (PF) 1 % IJ SOLN
INTRAMUSCULAR | Status: AC
  Filled 2020-01-15: qty 30

## 2020-01-15 MED ORDER — SODIUM CHLORIDE 0.9 % WEIGHT BASED INFUSION: 1.0000 mL/kg/h | INTRAVENOUS | Status: DC

## 2020-01-15 MED ORDER — ASPIRIN 81 MG PO CHEW: 81.0000 mg | CHEWABLE_TABLET | ORAL | Status: DC

## 2020-01-15 MED ORDER — HEPARIN (PORCINE) IN NACL 1000-0.9 UT/500ML-% IV SOLN
INTRAVENOUS | Status: DC | PRN
  Administered 2020-01-15 (×2): 500 mL

## 2020-01-15 MED ORDER — ACETAMINOPHEN 325 MG PO TABS: 650.0000 mg | ORAL_TABLET | ORAL | Status: DC | PRN

## 2020-01-15 MED ORDER — IOHEXOL 350 MG/ML SOLN
INTRAVENOUS | Status: DC | PRN
  Administered 2020-01-15: 60 mL via INTRA_ARTERIAL

## 2020-01-15 MED ORDER — ASPIRIN 81 MG PO CHEW: 81.0000 mg | CHEWABLE_TABLET | Freq: Every day | ORAL | Status: DC

## 2020-01-15 MED ORDER — SODIUM CHLORIDE 0.9% FLUSH: 3.0000 mL | INTRAVENOUS | Status: DC | PRN

## 2020-01-15 MED ORDER — FENTANYL CITRATE (PF) 100 MCG/2ML IJ SOLN
INTRAMUSCULAR | Status: DC | PRN
  Administered 2020-01-15: 25 ug via INTRAVENOUS

## 2020-01-15 MED ORDER — SODIUM CHLORIDE 0.9 % WEIGHT BASED INFUSION
3.0000 mL/kg/h | INTRAVENOUS | Status: AC
  Administered 2020-01-15: 3 mL/kg/h via INTRAVENOUS

## 2020-01-15 MED ORDER — IOHEXOL 350 MG/ML SOLN
INTRAVENOUS | Status: AC
  Filled 2020-01-15: qty 1

## 2020-01-15 MED ORDER — LABETALOL HCL 5 MG/ML IV SOLN: 10.0000 mg | INTRAVENOUS | Status: DC | PRN

## 2020-01-15 MED ORDER — ONDANSETRON HCL 4 MG/2ML IJ SOLN: 4.0000 mg | Freq: Four times a day (QID) | INTRAMUSCULAR | Status: DC | PRN

## 2020-01-15 MED ORDER — SODIUM CHLORIDE 0.9 % IV SOLN: INTRAVENOUS | Status: AC

## 2020-01-15 MED ORDER — HEPARIN (PORCINE) IN NACL 1000-0.9 UT/500ML-% IV SOLN
INTRAVENOUS | Status: AC
  Filled 2020-01-15: qty 1000

## 2020-01-15 MED ORDER — SODIUM CHLORIDE 0.9% FLUSH: 3.0000 mL | Freq: Two times a day (BID) | INTRAVENOUS | Status: DC

## 2020-01-15 MED ORDER — HYDRALAZINE HCL 20 MG/ML IJ SOLN: 10.0000 mg | INTRAMUSCULAR | Status: DC | PRN

## 2020-01-15 MED ORDER — MIDAZOLAM HCL 2 MG/2ML IJ SOLN
INTRAMUSCULAR | Status: DC | PRN
  Administered 2020-01-15: 1 mg via INTRAVENOUS

## 2020-01-15 MED ORDER — LIDOCAINE HCL (PF) 1 % IJ SOLN
INTRAMUSCULAR | Status: DC | PRN
  Administered 2020-01-15: 30 mL via INTRADERMAL

## 2020-01-15 MED ORDER — MORPHINE SULFATE (PF) 2 MG/ML IV SOLN: 2.0000 mg | INTRAVENOUS | Status: DC | PRN

## 2020-01-15 MED ORDER — MIDAZOLAM HCL 2 MG/2ML IJ SOLN
INTRAMUSCULAR | Status: AC
  Filled 2020-01-15: qty 2

## 2020-01-15 MED ORDER — SODIUM CHLORIDE 0.9 % IV SOLN: 250.0000 mL | INTRAVENOUS | Status: DC | PRN

## 2020-01-15 MED ORDER — FENTANYL CITRATE (PF) 100 MCG/2ML IJ SOLN
INTRAMUSCULAR | Status: AC
  Filled 2020-01-15: qty 2

## 2020-01-15 SURGICAL SUPPLY — 9 items
CATH INFINITI 5FR MULTPACK ANG (CATHETERS) ×1 IMPLANT
CLOSURE MYNX CONTROL 5F (Vascular Products) ×1 IMPLANT
KIT HEART LEFT (KITS) ×2 IMPLANT
PACK CARDIAC CATHETERIZATION (CUSTOM PROCEDURE TRAY) ×2 IMPLANT
SHEATH PINNACLE 5F 10CM (SHEATH) ×2 IMPLANT
SYR MEDRAD MARK 7 150ML (SYRINGE) ×2 IMPLANT
TRANSDUCER W/STOPCOCK (MISCELLANEOUS) ×2 IMPLANT
TUBING CIL FLEX 10 FLL-RA (TUBING) ×2 IMPLANT
WIRE EMERALD 3MM-J .035X150CM (WIRE) ×2 IMPLANT

## 2020-01-15 NOTE — Interval H&P Note (Signed)
Cath Lab Visit (complete for each Cath Lab visit)  Clinical Evaluation Leading to the Procedure:   ACS: No.  Non-ACS:    Anginal Classification: CCS II  Anti-ischemic medical therapy: No Therapy  Non-Invasive Test Results: No non-invasive testing performed  Prior CABG: Previous CABG      History and Physical Interval Note:  01/15/2020 9:29 AM  Michael Herrera  has presented today for surgery, with the diagnosis of chest pain.  The various methods of treatment have been discussed with the patient and family. After consideration of risks, benefits and other options for treatment, the patient has consented to  Procedure(s): LEFT HEART CATH AND CORS/GRAFTS ANGIOGRAPHY (N/A) as a surgical intervention.  The patient's history has been reviewed, patient examined, no change in status, stable for surgery.  I have reviewed the patient's chart and labs.  Questions were answered to the patient's satisfaction.     Quay Burow

## 2020-01-15 NOTE — Discharge Instructions (Signed)

## 2020-01-22 ENCOUNTER — Telehealth: Payer: Self-pay | Admitting: Cardiovascular Disease

## 2020-01-22 NOTE — Telephone Encounter (Signed)
Patient states that he still has some bleeding, not a lot, where he had his heart cath. He said he wakes up and theres a little blood on his band-aid and wants to make sure this is normal.

## 2020-01-22 NOTE — Telephone Encounter (Signed)
Called patient- he states that he has noticed some slight bleeding when he removes his band-aids. I did advise that this can happen, but patient would like to know from Dr.Berry if this is okay.  Advised I would route to him to advise.  Patient verbalized understanding.

## 2020-01-23 NOTE — Telephone Encounter (Signed)
It is now 8 days status post femoral diagnostic cath.  He was healed with a minx device.  He should not be bleeding this far out.  Have him come in on Monday to be seen by an APP in the office

## 2020-01-23 NOTE — Telephone Encounter (Signed)
Called patient, LVM advising that I had spoke with Dr.Berry- no aviable appointments on Monday- patient has appt on 06/23 with him, he states this is okay to wait- did advise patient if the bleeding became heavy, worse, bright red, or painful to go ahead and go to the ER to be evaluated.  Left call back number to discuss if questions.

## 2020-01-28 ENCOUNTER — Ambulatory Visit: Payer: PPO | Admitting: Cardiovascular Disease

## 2020-01-28 ENCOUNTER — Other Ambulatory Visit: Payer: Self-pay

## 2020-01-28 ENCOUNTER — Encounter: Payer: Self-pay | Admitting: Cardiovascular Disease

## 2020-01-28 VITALS — BP 126/74 | HR 72 | Ht 69.0 in | Wt 143.0 lb

## 2020-01-28 DIAGNOSIS — E785 Hyperlipidemia, unspecified: Secondary | ICD-10-CM

## 2020-01-28 DIAGNOSIS — I25118 Atherosclerotic heart disease of native coronary artery with other forms of angina pectoris: Secondary | ICD-10-CM | POA: Diagnosis not present

## 2020-01-28 DIAGNOSIS — I6523 Occlusion and stenosis of bilateral carotid arteries: Secondary | ICD-10-CM | POA: Diagnosis not present

## 2020-01-28 NOTE — Progress Notes (Signed)
01/28/2020 Michael Herrera   08-24-1950  [id number]  Primary Physician Michael Melter, MD Primary Cardiologist: Michael Harp MD Michael Herrera, [place]  HPI:  Michael Herrera is a 69 y.o.  fit appearing married Caucasian male father 5, grandfather of 4 grandchildren whose primary care provider is Michael Herrera. He is also cared for at the [facility] in [place]. 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 His cardiac risk factors notable for treated hyperlipidemia but otherwise are negative. He is never had a heart attack or stroke. He did have stents to his RCA and LAD back in 2015 and bypass grafting by Dr. Roxan Herrera January 2016. He was admitted to [facility] with unstable angina 07/19/2018. His enzymes were negative. He underwent cardiac catheterization by myself 07/22/2018 revealing a patent vein to a diagonal, patent vein to the marginal branch, atretic LIMA to the LAD and a patent stent to the native RCA which was not bypassed with a 70% distal RCA stenosis at the crux. His LV function was normal. I performed PCI and drug-eluting stenting of his left main into his LAD postdilated to 3.4 mm. He has had no symptoms since and remains on dual antiplatelet therapy including aspirin and Plavix. He walked 78 miles last week and typically walks 10 miles a day at 4.3 mph without symptoms. His most recent lab work at the [facility] revealed a total cholesterol of less than 100 with an LDL in the 30 range.Since I saw him 6 months ago he continues to do well. He exercises frequently and in large quantities and is completely asymptomatic denying chest pain or shortness of breath.  Since I saw him 8 months ago he continues to do well walking 10 miles a day until this past Saturday when he started noticed fatigue, chest pain rating to his jaw and back consistent with  his angina.  He took a symptom nitroglycerin which resulted in some relief.  I performed outpatient diagnostic coronary angiography on him 01/15/2020 revealing a patent left main stent, patent vein to diagonal branch and circumflex obtuse marginal branch, patent mid RCA stent and unchanged distal RCA stenosis.  LV function was normal.  Performance closure of his right common femoral puncture site and he was discharged home later that day.  Unfortunately he was oozing from his puncture site post procedure and ultimately pulled off the Band-Aid removing the collagen plug from the Mynx closure device.  He has been using blood for the last several days which is ultimately stopped yesterday.  He has no further chest pain since the procedure.   No outpatient medications have been marked as taking for the 01/28/20 encounter (Office Visit) with Michael Harp, MD.   Current Facility-Administered Medications for the 01/28/20 encounter (Office Visit) with Michael Harp, MD  Medication  . sodium chloride flush (NS) 0.9 % injection 3 mL     No Known Allergies  Social History   Socioeconomic History  . Marital status: Married    Spouse name: Not on file  . Number of children: Not on file  . Years of education: Not on file  . Highest education level: Not on file  Occupational History  . Not on file  Tobacco Use  . Smoking status: Never Smoker  . Smokeless tobacco: Never Used  Substance and Sexual Activity  .  Alcohol use: Yes    Alcohol/week: 3.0 standard drinks    Types: 3 Cans of beer per week    Comment: 1 beer 3 times weekly  . Drug use: No  . Sexual activity: Yes  Other Topics Concern  . Not on file  Social History Narrative  . Not on file   Social Determinants of Health   Financial Resource Strain:   . Difficulty of Paying Living Expenses:   Food Insecurity:   . Worried About Charity fundraiser in the Last Year:   . Arboriculturist in the Last Year:   Transportation Needs:     . Film/video editor (Medical):   [name] Lack of Transportation (Non-Medical):   Physical Activity:   . Days of Exercise per Week:   . Minutes of Exercise per Session:   Stress:   . Feeling of Stress :   Social Connections:   . Frequency of Communication with Friends and Family:   . Frequency of Social Gatherings with Friends and Family:   . Attends Religious Services:   . Active Member of Clubs or Organizations:   . Attends Archivist Meetings:   [name] Marital Status:   Intimate Partner Violence:   . Fear of Current or Ex-Partner:   . Emotionally Abused:   [name] Physically Abused:   . Sexually Abused:      Review of Systems: General: negative for chills, fever, night sweats or weight changes.  Cardiovascular: negative for chest pain, dyspnea on exertion, edema, orthopnea, palpitations, paroxysmal nocturnal dyspnea or shortness of breath Dermatological: negative for rash Respiratory: negative for cough or wheezing Urologic: negative for hematuria Abdominal: negative for nausea, vomiting, diarrhea, bright red blood per rectum, melena, or hematemesis Neurologic: negative for visual changes, syncope, or dizziness All other systems reviewed and are otherwise negative except as noted above.    Blood pressure 126/74, pulse 72, height 5\' 9"  (1.753 m), weight 143 lb (64.9 kg), SpO2 97 %.  General appearance: alert and no distress Neck: no adenopathy, no carotid bruit, no JVD, supple, symmetrical, trachea midline and thyroid not enlarged, symmetric, no tenderness/mass/nodules Lungs: clear to auscultation bilaterally Heart: regular rate and rhythm, S1, S2 normal, no murmur, click, rub or gallop Extremities: extremities normal, atraumatic, no cyanosis or edema Pulses: 2+ and symmetric Skin: Skin color, texture, turgor normal. No rashes or lesions Neurologic: Alert and oriented X 3, normal strength and tone. Normal symmetric reflexes. Normal coordination and gait  EKG not performed  today  ASSESSMENT AND PLAN:   CAD (coronary artery disease) Michael Herrera returns today for post procedure follow-up.  He had RCA and LAD stents placed in 2015 and ultimately underwent CABG by Dr. Roxan Herrera in 2016.  I catheterized him 09/22/2017 in the setting of unstable angina and revealed an atretic LIMA to the LAD, patent vein graft to diagonal branch, obtuse marginal branch and patent stent to the mid RCA and proximal LAD.  He did have a 70% distal RCA stenosis at the crux and 80% left main stenosis which I ended up stenting with excellent result.  He had recurrent symptoms and based on this I repeated his cardiac cath 01/15/2020 revealing a widely patent left main stent, patent vein grafts as described, patent stent in the mid RCA and unchanged distal RCA stenosis.  I performed Mynx closure of his right common femoral artery puncture site.  He had some oozing around his site post procedure and when he pulled off his Band-Aid the  Mynx collagen plug came with.  Yesterday was the last day that he stopped using.  His site appears dry.  There is no bruit.  Does have some swelling below this probably related to hematoma.      Michael Harp MD FACP,FACC,FAHA, [facility] 01/28/2020 11:26 AM

## 2020-01-28 NOTE — Assessment & Plan Note (Signed)
Mr. Bufano returns today for post procedure follow-up.  He had RCA and LAD stents placed in 2015 and ultimately underwent CABG by Dr. [name] in 2016.  I catheterized him 09/22/2017 in the setting of unstable angina and revealed an atretic LIMA to the LAD, patent vein graft to diagonal branch, obtuse marginal branch and patent stent to the mid RCA and proximal LAD.  He did have a 70% distal RCA stenosis at the crux and 80% left main stenosis which I ended up stenting with excellent result.  He had recurrent symptoms and based on this I repeated his cardiac cath 01/15/2020 revealing a widely patent left main stent, patent vein grafts as described, patent stent in the mid RCA and unchanged distal RCA stenosis.  I performed Mynx closure of his right common femoral artery puncture site.  He had some oozing around his site post procedure and when he pulled off his Band-Aid the Mynx collagen plug came with.  Yesterday was the last day that he stopped using.  His site appears dry.  There is no bruit.  Does have some swelling below this probably related to hematoma.

## 2020-01-28 NOTE — Patient Instructions (Signed)
Medication Instructions:  Your physician recommends that you continue on your current medications as directed. Please refer to the Current Medication list given to you today.  *If you need a refill on your cardiac medications before your next appointment, please call your pharmacy*  Follow-Up: At [facility], you and your health needs are our priority.  As part of our continuing mission to provide you with exceptional heart care, we have created designated Provider Care Teams.  These Care Teams include your primary Cardiologist (physician) and Advanced Practice Providers (APPs -  Physician Assistants and Nurse Practitioners) who all work together to provide you with the care you need, when you need it.  We recommend signing up for the patient portal called "MyChart".  Sign up information is provided on this After Visit Summary.  MyChart is used to connect with patients for Virtual Visits (Telemedicine).  Patients are able to view lab/test results, encounter notes, upcoming appointments, etc.  Non-urgent messages can be sent to your provider as well.   To learn more about what you can do with MyChart, go to [email or web address].    Your next appointment:   6 month(s)  The format for your next appointment:   In Person  Provider:   You may see Quay Burow, MD or one of the following Advanced Practice Providers on your designated Care Team:    Kerin Ransom, PA-C  [place], [place]  Coletta Memos, [place]    Other Instructions Please call our office 2 months in advance to schedule your follow-up appointment with Dr. Gwenlyn Found.

## 2020-02-05 DIAGNOSIS — Z85828 Personal history of other malignant neoplasm of skin: Secondary | ICD-10-CM | POA: Diagnosis not present

## 2020-02-05 DIAGNOSIS — D1801 Hemangioma of skin and subcutaneous tissue: Secondary | ICD-10-CM | POA: Diagnosis not present

## 2020-02-05 DIAGNOSIS — L570 Actinic keratosis: Secondary | ICD-10-CM | POA: Diagnosis not present

## 2020-02-05 DIAGNOSIS — L821 Other seborrheic keratosis: Secondary | ICD-10-CM | POA: Diagnosis not present

## 2020-02-05 DIAGNOSIS — L814 Other melanin hyperpigmentation: Secondary | ICD-10-CM | POA: Diagnosis not present

## 2020-02-05 DIAGNOSIS — D0461 Carcinoma in situ of skin of right upper limb, including shoulder: Secondary | ICD-10-CM | POA: Diagnosis not present

## 2020-02-05 DIAGNOSIS — D225 Melanocytic nevi of trunk: Secondary | ICD-10-CM | POA: Diagnosis not present

## 2020-05-24 DIAGNOSIS — Z23 Encounter for immunization: Secondary | ICD-10-CM | POA: Diagnosis not present

## 2020-08-12 DIAGNOSIS — L570 Actinic keratosis: Secondary | ICD-10-CM | POA: Diagnosis not present

## 2020-08-12 DIAGNOSIS — L821 Other seborrheic keratosis: Secondary | ICD-10-CM | POA: Diagnosis not present

## 2020-08-12 DIAGNOSIS — Z85828 Personal history of other malignant neoplasm of skin: Secondary | ICD-10-CM | POA: Diagnosis not present

## 2020-08-12 DIAGNOSIS — C44629 Squamous cell carcinoma of skin of left upper limb, including shoulder: Secondary | ICD-10-CM | POA: Diagnosis not present

## 2020-08-17 ENCOUNTER — Encounter: Payer: Self-pay | Admitting: Cardiovascular Disease

## 2020-08-17 ENCOUNTER — Ambulatory Visit: Payer: PPO | Admitting: Cardiovascular Disease

## 2020-08-17 ENCOUNTER — Other Ambulatory Visit: Payer: Self-pay

## 2020-08-17 VITALS — BP 134/70 | HR 65 | Ht 69.0 in | Wt 150.0 lb

## 2020-08-17 DIAGNOSIS — I2510 Atherosclerotic heart disease of native coronary artery without angina pectoris: Secondary | ICD-10-CM | POA: Diagnosis not present

## 2020-08-17 DIAGNOSIS — E785 Hyperlipidemia, unspecified: Secondary | ICD-10-CM | POA: Diagnosis not present

## 2020-08-17 NOTE — Assessment & Plan Note (Signed)
History of CAD status post stents to his RCA and LAD back in 2015 and ultimately undergoing coronary bypass grafting by Dr. [name] in 08 August 2014.  He underwent cardiac catheterization by myself in setting of unstable angina 07/22/2018 revealing a patent vein to the diagonal branch, patent vein to a marginal branch, and atretic LIMA to the LAD with a widely patent stent to the native RCA which was not bypassed and a 70% distal RCA stenosis at the crux with normal LV function.  I performed PCI drug-eluting stenting of the left main into the LAD postdilated to 3.4 mm.  He did have recurrent chest pain 01/13/2020 because of this I recath him 2 days later revealing unchanged anatomy.  He has been asymptomatic since.

## 2020-08-17 NOTE — Assessment & Plan Note (Addendum)
History of hyperlipidemia on statin therapy followed by the [facility]. Recent FLP 10/08/20 revealed a tot Chol 106 withj an LDL of 38 and an HDL of 48.

## 2020-08-17 NOTE — Progress Notes (Addendum)
10/22/2020 Michael Herrera   1951/06/21  [id number]  Primary Physician Orpah Melter, MD Primary Cardiologist: Lorretta Harp MD [name], [place]  HPI:  Michael Herrera is a 70 y.o. fit appearing married Caucasian male father 85, grandfather of 4 grandchildren whose primary care provider is Dr. Christella Noa. He is also cared for at the [facility] in [place]. 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 His cardiac risk factors notable for treated hyperlipidemia but otherwise are negative. He is never had a heart attack or stroke. He did have stents to his RCA and LAD back in 2015 and bypass grafting by Dr. [name] January 2016. He was admitted to [facility] with unstable angina 07/19/2018. His enzymes were negative. He underwent cardiac catheterization by myself 07/22/2018 revealing a patent vein to a diagonal, patent vein to the marginal branch, atretic LIMA to the LAD and a patent stent to the native RCA which was not bypassed with a 70% distal RCA stenosis at the crux. His LV function was normal. I performed PCI and drug-eluting stenting of his left main into his LAD postdilated to 3.4 mm. He has had no symptoms since and remains on dual antiplatelet therapy including aspirin and Plavix. He walked 78 miles last week and typically walks 10 miles a day at 4.3 mph without symptoms. His most recent lab work at the [facility] revealed a total cholesterol of less than 100 with an LDL in the 30 range.Since I saw him 6 months ago he continues to do well. He exercises frequently and in large quantities and is completely asymptomatic denying chest pain or shortness of breath.  When I saw him 6 months ago he was complaining of effort angina and dyspnea.  I performed diagnostic coronary angiography as an outpatient on 01/15/2020 revealing unchanged anatomy.  I  performed Mynx closure and stenting on the same day.  He has had no recurrent symptoms.   No outpatient medications have been marked as taking for the 08/17/20 encounter (Office Visit) with Lorretta Harp, MD.   Current Facility-Administered Medications for the 08/17/20 encounter (Office Visit) with Lorretta Harp, MD  Medication  . sodium chloride flush (NS) 0.9 % injection 3 mL     No Known Allergies  Social History   Socioeconomic History  . Marital status: Married    Spouse name: Not on file  . Number of children: Not on file  . Years of education: Not on file  . Highest education level: Not on file  Occupational History  . Not on file  Tobacco Use  . Smoking status: Never Smoker  . Smokeless tobacco: Never Used  Substance and Sexual Activity  . Alcohol use: Yes    Alcohol/week: 3.0 standard drinks    Types: 3 Cans of beer per week    Comment: 1 beer 3 times weekly  . Drug use: No  . Sexual activity: Yes  Other Topics Concern  . Not on file  Social History Narrative  . Not on file   Social Determinants of Health   Financial Resource Strain: Not on file  Food Insecurity: Not on file  Transportation Needs: Not on file  Physical Activity: Not on file  Stress: Not on file  Social Connections: Not on file  Intimate Partner Violence: Not on file     Review of  Systems: General: negative for chills, fever, night sweats or weight changes.  Cardiovascular: negative for chest pain, dyspnea on exertion, edema, orthopnea, palpitations, paroxysmal nocturnal dyspnea or shortness of breath Dermatological: negative for rash Respiratory: negative for cough or wheezing Urologic: negative for hematuria Abdominal: negative for nausea, vomiting, diarrhea, bright red blood per rectum, melena, or hematemesis Neurologic: negative for visual changes, syncope, or dizziness All other systems reviewed and are otherwise negative except as noted above.    Blood pressure 134/70,  pulse 65, height 5\' 9"  (1.753 m), weight 150 lb (68 kg).  General appearance: alert and no distress Neck: no adenopathy, no carotid bruit, no JVD, supple, symmetrical, trachea midline and thyroid not enlarged, symmetric, no tenderness/mass/nodules Lungs: clear to auscultation bilaterally Heart: regular rate and rhythm, S1, S2 normal, no murmur, click, rub or gallop Extremities: extremities normal, atraumatic, no cyanosis or edema Pulses: 2+ and symmetric Skin: Skin color, texture, turgor normal. No rashes or lesions Neurologic: Alert and oriented X 3, normal strength and tone. Normal symmetric reflexes. Normal coordination and gait  EKG sinus rhythm at 65 without ST or T wave changes.  I personally reviewed this EKG.  ASSESSMENT AND PLAN:   CAD (coronary artery disease) History of CAD status post stents to his RCA and LAD back in 2015 and ultimately undergoing coronary bypass grafting by Dr. [name] in 08 August 2014.  He underwent cardiac catheterization by myself in setting of unstable angina 07/22/2018 revealing a patent vein to the diagonal branch, patent vein to a marginal branch, and atretic LIMA to the LAD with a widely patent stent to the native RCA which was not bypassed and a 70% distal RCA stenosis at the crux with normal LV function.  I performed PCI drug-eluting stenting of the left main into the LAD postdilated to 3.4 mm.  He did have recurrent chest pain 01/13/2020 because of this I recath him 2 days later revealing unchanged anatomy.  He has been asymptomatic since.  Dyslipidemia History of hyperlipidemia on statin therapy followed by the [facility]. Recent FLP 10/08/20 revealed a tot Chol 106 withj an LDL of 38 and an HDL of 48.      Lorretta Harp MD FACP,FACC,FAHA, [facility] 10/22/2020 4:46 PM

## 2020-08-17 NOTE — Patient Instructions (Signed)

## 2020-09-16 ENCOUNTER — Other Ambulatory Visit: Payer: Self-pay | Admitting: Cardiovascular Disease

## 2020-09-22 DIAGNOSIS — M25551 Pain in right hip: Secondary | ICD-10-CM | POA: Diagnosis not present

## 2020-09-22 DIAGNOSIS — I2510 Atherosclerotic heart disease of native coronary artery without angina pectoris: Secondary | ICD-10-CM | POA: Diagnosis not present

## 2020-09-22 DIAGNOSIS — E7800 Pure hypercholesterolemia, unspecified: Secondary | ICD-10-CM | POA: Diagnosis not present

## 2020-10-12 ENCOUNTER — Telehealth: Payer: Self-pay | Admitting: Cardiovascular Disease

## 2020-10-12 NOTE — Telephone Encounter (Signed)
Patient states he recently had lab work and he plans to mail a copy of it to the office. He states he does not live close to the office and it will be most convenient for him to mail in his results.

## 2020-10-20 NOTE — Telephone Encounter (Signed)
Received lab report. Will forward to Dr. Gwenlyn Found for review.

## 2020-12-24 ENCOUNTER — Other Ambulatory Visit: Payer: Self-pay | Admitting: Cardiovascular Disease

## 2021-01-14 DIAGNOSIS — H5203 Hypermetropia, bilateral: Secondary | ICD-10-CM | POA: Diagnosis not present

## 2021-01-14 DIAGNOSIS — H2513 Age-related nuclear cataract, bilateral: Secondary | ICD-10-CM | POA: Diagnosis not present

## 2021-01-14 DIAGNOSIS — H25013 Cortical age-related cataract, bilateral: Secondary | ICD-10-CM | POA: Diagnosis not present

## 2021-02-14 DIAGNOSIS — D485 Neoplasm of uncertain behavior of skin: Secondary | ICD-10-CM | POA: Diagnosis not present

## 2021-02-14 DIAGNOSIS — L570 Actinic keratosis: Secondary | ICD-10-CM | POA: Diagnosis not present

## 2021-02-14 DIAGNOSIS — D692 Other nonthrombocytopenic purpura: Secondary | ICD-10-CM | POA: Diagnosis not present

## 2021-02-14 DIAGNOSIS — Z85828 Personal history of other malignant neoplasm of skin: Secondary | ICD-10-CM | POA: Diagnosis not present

## 2021-02-14 DIAGNOSIS — L821 Other seborrheic keratosis: Secondary | ICD-10-CM | POA: Diagnosis not present

## 2021-02-14 DIAGNOSIS — L814 Other melanin hyperpigmentation: Secondary | ICD-10-CM | POA: Diagnosis not present

## 2021-02-14 DIAGNOSIS — B078 Other viral warts: Secondary | ICD-10-CM | POA: Diagnosis not present

## 2021-05-20 DIAGNOSIS — I25119 Atherosclerotic heart disease of native coronary artery with unspecified angina pectoris: Secondary | ICD-10-CM | POA: Diagnosis not present

## 2021-05-20 DIAGNOSIS — K649 Unspecified hemorrhoids: Secondary | ICD-10-CM | POA: Diagnosis not present

## 2021-05-24 DIAGNOSIS — Z23 Encounter for immunization: Secondary | ICD-10-CM | POA: Diagnosis not present

## 2021-06-24 DIAGNOSIS — Z86010 Personal history of colonic polyps: Secondary | ICD-10-CM | POA: Diagnosis not present

## 2021-06-24 DIAGNOSIS — K625 Hemorrhage of anus and rectum: Secondary | ICD-10-CM | POA: Diagnosis not present

## 2021-06-24 DIAGNOSIS — Z7901 Long term (current) use of anticoagulants: Secondary | ICD-10-CM | POA: Diagnosis not present

## 2021-06-27 ENCOUNTER — Telehealth: Payer: Self-pay | Admitting: *Deleted

## 2021-06-27 NOTE — Telephone Encounter (Signed)
   [facility] Pre-operative Risk Assessment    Patient Name: Michael Herrera  DOB: [birth year] MRN: [id number]  HEARTCARE STAFF:  - IMPORTANT!!!!!! Under Visit Info/Reason for Call, type in Other and utilize the format Clearance MM/DD/YY or Clearance TBD. Do not use dashes or single digits. - Please review there is not already an duplicate clearance open for this procedure. - If request is for dental extraction, please clarify the # of teeth to be extracted. - If the patient is currently at the dentist's office, call Pre-Op Callback Staff (MA/nurse) to input urgent request.  - If the patient is not currently in the dentist office, please route to the Pre-Op pool.  Request for surgical clearance:  What type of surgery is being performed? colonoscopy  When is this surgery scheduled? 08/30/21  What type of clearance is required (medical clearance vs. Pharmacy clearance to hold med vs. Both)? both  Are there any medications that need to be held prior to surgery and how long? Plavix-need direction  Practice name and name of physician performing surgery? [facility]   What is the office phone number? [phone]   7.   What is the office fax number? [phone]  8.   Anesthesia type (None, local, MAC, general) ? propofol   Fredia Beets 06/27/2021, 10:23 AM  _________________________________________________________________   (provider comments below)

## 2021-06-27 NOTE — Telephone Encounter (Signed)
    Patient Name: Michael Herrera  DOB: 1951/07/30 MRN: [id number]  Primary Cardiologist: Quay Burow, MD  Chart reviewed as part of pre-operative protocol coverage. Given past medical history and time since last visit, based on ACC/AHA guidelines, Michael Herrera would be at acceptable risk for the planned procedure without further cardiovascular testing. He exercise without any problem.   Dr. Gwenlyn Found, can he hold his plavix for 5 days?  Please forward your response to P CV DIV PREOP.   Thank you     Leanor Kail, PA 06/27/2021, 11:03 AM

## 2021-06-28 NOTE — Telephone Encounter (Signed)
    Patient Name: Michael Herrera  DOB: Oct 02, 1950 MRN: [id number]  Primary Cardiologist: Quay Burow, MD  Chart reviewed as part of pre-operative protocol coverage. Per note from colleague Robbie Lis, PA-C, on 06/27/2021, patient at acceptable risk for planned procedure.  Per Dr. Gwenlyn Found, Dale to hold Plavix as needed for colonoscopy (usually 5 days prior). Please restart this as soon as safely possible following procedure.  I will route this recommendation to the requesting party via Epic fax function and remove from pre-op pool.  Please call with questions.  Darreld Mclean, PA-C 06/28/2021, 8:05 PM

## 2021-08-12 ENCOUNTER — Other Ambulatory Visit: Payer: Self-pay

## 2021-08-12 ENCOUNTER — Encounter: Payer: Self-pay | Admitting: Cardiovascular Disease

## 2021-08-12 ENCOUNTER — Ambulatory Visit: Payer: PPO | Admitting: Cardiovascular Disease

## 2021-08-12 VITALS — BP 138/74 | HR 66 | Ht 69.0 in | Wt 154.0 lb

## 2021-08-12 DIAGNOSIS — E785 Hyperlipidemia, unspecified: Secondary | ICD-10-CM

## 2021-08-12 DIAGNOSIS — I2510 Atherosclerotic heart disease of native coronary artery without angina pectoris: Secondary | ICD-10-CM | POA: Diagnosis not present

## 2021-08-12 NOTE — Assessment & Plan Note (Signed)
History of CAD status post stenting of his RCA and LAD back in 2015 ultimately requiring CABG in January 2016.  I catheterized him 07/22/2018 revealing a patent vein to diagonal branch, patent vein to a marginal branch and an atretic LIMA to the LAD.  He had a patent RCA stent.  The RCA was not bypassed.  He did have a 70% distal RCA stenosis at the crux.  I ultimately performed left main stenting into his LAD postdilated to 3.4 mm.  He has had no recurrent symptoms.

## 2021-08-12 NOTE — Assessment & Plan Note (Signed)
History of dyslipidemia on statin therapy with lipid profile performed at the [facility] 10/08/2020 revealing a total cholesterol 106, LDL of 38 and HDL of 48.

## 2021-08-12 NOTE — Patient Instructions (Signed)

## 2021-08-12 NOTE — Progress Notes (Signed)
08/12/2021 Dravyn D Jutte   [birth year]  [id number]  Primary Physician Orpah Melter, MD Primary Cardiologist: Lorretta Harp MD [name], [place]  HPI:  Michael Herrera is a 71 y.o. fit appearing married Caucasian male father 30, grandfather of 4 grandchildren whose primary care provider is Dr. Christella Noa.  He is also cared for at the [facility] in [place].  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  I last saw him in the office 10/22/2020.[name]  His cardiac risk factors notable for treated hyperlipidemia but otherwise are negative.  He is never had a heart attack or stroke.  He did have stents to his RCA and LAD back in 2015 and bypass grafting by Dr. [name] January 2016.  He was admitted to [facility] with unstable angina 07/19/2018.  His enzymes were negative.  He underwent cardiac catheterization by myself 07/22/2018 revealing a patent vein to a diagonal, patent vein to the marginal branch, atretic LIMA to the LAD and a patent stent to the native RCA which was not bypassed with a 70% distal RCA stenosis at the crux.  His LV function was normal.  I performed PCI and drug-eluting stenting of he his left main into his LAD postdilated to 3.4 mm.  He has had no symptoms since and remains on dual antiplatelet therapy including aspirin and Plavix.  He walked 78 miles last week and typically walks 10 miles a day at 4.3 mph without symptoms.  His most recent lab work at the [facility] revealed a total cholesterol of less than 100 with an LDL in the 30 range.Since I saw him 6 months ago he continues to do well.  He exercises frequently and in large quantities and is completely asymptomatic denying chest pain or shortness of breath.   He was complaining of effort angina and dyspnea.  I performed diagnostic coronary angiography as an outpatient on 01/15/2020 revealing unchanged anatomy.  I performed Mynx closure and  stenting on the same day.   Since I saw him a year ago he continues to do well.  He walks approximately 10 miles a day and is completely asymptomatic.  Current Meds  Medication Sig   acetaminophen (TYLENOL) 500 MG tablet Take 1,000 mg by mouth every 6 (six) hours as needed for headache.   Ascorbic Acid (VITAMIN C) 1000 MG tablet Take 1,000 mg by mouth every morning.    aspirin 81 MG tablet Take 81 mg by mouth every evening.    atorvastatin (LIPITOR) 80 MG tablet Take 1 tablet (80 mg total) by mouth every evening.   clopidogrel (PLAVIX) 75 MG tablet TAKE 1 TABLET BY MOUTH EVERY DAY   ezetimibe (ZETIA) 10 MG tablet Take 1 tablet (10 mg total) by mouth daily.   loratadine-pseudoephedrine (CLARITIN-D 24-HOUR) 10-240 MG 24 hr tablet Take 1 tablet by mouth daily as needed for allergies.   nitroGLYCERIN (NITROSTAT) 0.4 MG SL tablet Place 1 tablet (0.4 mg total) under the tongue every 5 (five) minutes as needed for chest pain.   Omega-3 Fatty Acids (FISH OIL PO) Take 1,400 mg by mouth every morning.    vitamin E 400 UNIT capsule Take 400 Units by mouth daily.   Current Facility-Administered Medications for the 08/12/21 encounter (Office Visit) with Lorretta Harp, MD  Medication   sodium chloride flush (NS) 0.9 % injection 3 mL     No Known  Allergies  Social History   Socioeconomic History   Marital status: Married    Spouse name: Not on file   Number of children: Not on file   Years of education: Not on file   Highest education level: Not on file  Occupational History   Not on file  Tobacco Use   Smoking status: Never   Smokeless tobacco: Never  Substance and Sexual Activity   Alcohol use: Yes    Alcohol/week: 3.0 standard drinks    Types: 3 Cans of beer per week    Comment: 1 beer 3 times weekly   Drug use: No   Sexual activity: Yes  Other Topics Concern   Not on file  Social History Narrative   Not on file   Social Determinants of Health   Financial Resource Strain: Not  on file  Food Insecurity: Not on file  Transportation Needs: Not on file  Physical Activity: Not on file  Stress: Not on file  Social Connections: Not on file  Intimate Partner Violence: Not on file     Review of Systems: General: negative for chills, fever, night sweats or weight changes.  Cardiovascular: negative for chest pain, dyspnea on exertion, edema, orthopnea, palpitations, paroxysmal nocturnal dyspnea or shortness of breath Dermatological: negative for rash Respiratory: negative for cough or wheezing Urologic: negative for hematuria Abdominal: negative for nausea, vomiting, diarrhea, bright red blood per rectum, melena, or hematemesis Neurologic: negative for visual changes, syncope, or dizziness All other systems reviewed and are otherwise negative except as noted above.    Blood pressure 138/74, pulse 66, height 5\' 9"  (1.753 m), weight 154 lb (69.9 kg).  General appearance: alert and no distress Neck: no adenopathy, no carotid bruit, no JVD, supple, symmetrical, trachea midline, and thyroid not enlarged, symmetric, no tenderness/mass/nodules Lungs: clear to auscultation bilaterally Heart: regular rate and rhythm, S1, S2 normal, no murmur, click, rub or gallop Extremities: extremities normal, atraumatic, no cyanosis or edema Pulses: 2+ and symmetric Skin: Skin color, texture, turgor normal. No rashes or lesions Neurologic: Grossly normal  EKG sinus rhythm at 66 without ST or T wave changes.  I personally reviewed this EKG.  ASSESSMENT AND PLAN:   CAD (coronary artery disease) History of CAD status post stenting of his RCA and LAD back in 2015 ultimately requiring CABG in January 2016.  I catheterized him 07/22/2018 revealing a patent vein to diagonal branch, patent vein to a marginal branch and an atretic LIMA to the LAD.  He had a patent RCA stent.  The RCA was not bypassed.  He did have a 70% distal RCA stenosis at the crux.  I ultimately performed left main stenting  into his LAD postdilated to 3.4 mm.  He has had no recurrent symptoms.  Dyslipidemia History of dyslipidemia on statin therapy with lipid profile performed at the [facility] 10/08/2020 revealing a total cholesterol 106, LDL of 38 and HDL of 48.     Lorretta Harp MD FACP,FACC,FAHA, [facility] 08/12/2021 9:57 AM

## 2021-08-30 DIAGNOSIS — D124 Benign neoplasm of descending colon: Secondary | ICD-10-CM | POA: Diagnosis not present

## 2021-08-30 DIAGNOSIS — K648 Other hemorrhoids: Secondary | ICD-10-CM | POA: Diagnosis not present

## 2021-08-30 DIAGNOSIS — Z86010 Personal history of colonic polyps: Secondary | ICD-10-CM | POA: Diagnosis not present

## 2021-09-02 DIAGNOSIS — D124 Benign neoplasm of descending colon: Secondary | ICD-10-CM | POA: Diagnosis not present

## 2021-09-13 DIAGNOSIS — K649 Unspecified hemorrhoids: Secondary | ICD-10-CM | POA: Diagnosis not present

## 2021-09-13 DIAGNOSIS — J069 Acute upper respiratory infection, unspecified: Secondary | ICD-10-CM | POA: Diagnosis not present

## 2022-01-19 DIAGNOSIS — H524 Presbyopia: Secondary | ICD-10-CM | POA: Diagnosis not present

## 2022-01-19 DIAGNOSIS — H2513 Age-related nuclear cataract, bilateral: Secondary | ICD-10-CM | POA: Diagnosis not present

## 2022-01-19 DIAGNOSIS — H25013 Cortical age-related cataract, bilateral: Secondary | ICD-10-CM | POA: Diagnosis not present

## 2022-02-15 DIAGNOSIS — L820 Inflamed seborrheic keratosis: Secondary | ICD-10-CM | POA: Diagnosis not present

## 2022-02-15 DIAGNOSIS — Z85828 Personal history of other malignant neoplasm of skin: Secondary | ICD-10-CM | POA: Diagnosis not present

## 2022-02-15 DIAGNOSIS — L821 Other seborrheic keratosis: Secondary | ICD-10-CM | POA: Diagnosis not present

## 2022-02-15 DIAGNOSIS — D485 Neoplasm of uncertain behavior of skin: Secondary | ICD-10-CM | POA: Diagnosis not present

## 2022-02-15 DIAGNOSIS — B078 Other viral warts: Secondary | ICD-10-CM | POA: Diagnosis not present

## 2022-02-15 DIAGNOSIS — D225 Melanocytic nevi of trunk: Secondary | ICD-10-CM | POA: Diagnosis not present

## 2022-02-15 DIAGNOSIS — L570 Actinic keratosis: Secondary | ICD-10-CM | POA: Diagnosis not present

## 2022-02-15 DIAGNOSIS — L814 Other melanin hyperpigmentation: Secondary | ICD-10-CM | POA: Diagnosis not present

## 2022-03-09 ENCOUNTER — Other Ambulatory Visit: Payer: Self-pay | Admitting: Cardiovascular Disease

## 2022-03-31 ENCOUNTER — Other Ambulatory Visit: Payer: Self-pay

## 2022-03-31 NOTE — Patient Outreach (Signed)
  Care Coordination   Initial Visit Note   03/31/2022 Name: Michael Herrera MRN: [id number] DOB: 10/30/1950  Michael Herrera is a 71 y.o. year old male who sees Michael Melter, MD for primary care. I spoke with  Michael Herrera by phone today.  What matters to the patients health and wellness today?  No concerns today.  I walk 6 miles a day and feel good    Goals Addressed             This Visit's Progress    COMPLETED: Care Coordination Activities - no follow up required       Care Coordination Interventions: Advised patient to call PCP to schedule Annual Wellness Visit  Provided education to patient re: Annual Wellness Visit, care coordination services Assessed social determinant of health barriers          SDOH assessments and interventions completed:  Yes  SDOH Interventions Today    Flowsheet Row Most Recent Value  SDOH Interventions   Food Insecurity Interventions Intervention Not Indicated  Financial Strain Interventions Intervention Not Indicated  Housing Interventions Intervention Not Indicated  Physical Activity Interventions Intervention Not Indicated  Transportation Interventions Intervention Not Indicated        Care Coordination Interventions Activated:  Yes  Care Coordination Interventions:  Yes, provided   Follow up plan: No further intervention required.   Encounter Outcome:  Pt. Visit Completed  Peter Garter RN, BSN,CCM, CDE Care Management Coordinator [facility] [phone]

## 2022-03-31 NOTE — Patient Instructions (Signed)
Visit Information  Thank you for taking time to visit with me today. Please don't hesitate to contact me if I can be of assistance to you.   Following are the goals we discussed today:   Goals Addressed             This Visit's Progress    COMPLETED: Care Coordination Activities - no follow up required       Care Coordination Interventions: Advised patient to call PCP to schedule Annual Wellness Visit  Provided education to patient re: Annual Wellness Visit, care coordination services Assessed social determinant of health barriers          If you are experiencing a Mental Health or [place] or need someone to talk to, please call the Suicide and Crisis Lifeline: 988 call the [place] National Suicide Prevention Lifeline: 608-084-7455 or TTY: 478-625-1482 TTY 340-384-7039) to talk to a trained counselor call 1-800-273-TALK (toll free, 24 hour hotline) go to [facility] [address], [place] [id number]) call 911   Patient verbalizes understanding of instructions and care plan provided today and agrees to view in [place]. Active MyChart status and patient understanding of how to access instructions and care plan via MyChart confirmed with patient.     No further follow up required:   Peter Garter RN, Jackquline Denmark, [place] [facility] [phone]

## 2022-05-12 DIAGNOSIS — Z23 Encounter for immunization: Secondary | ICD-10-CM | POA: Diagnosis not present

## 2022-08-22 DIAGNOSIS — C44319 Basal cell carcinoma of skin of other parts of face: Secondary | ICD-10-CM | POA: Diagnosis not present

## 2022-08-22 DIAGNOSIS — Z85828 Personal history of other malignant neoplasm of skin: Secondary | ICD-10-CM | POA: Diagnosis not present

## 2022-09-22 ENCOUNTER — Ambulatory Visit: Payer: PPO | Attending: Cardiovascular Disease | Admitting: Cardiovascular Disease

## 2022-09-22 ENCOUNTER — Encounter: Payer: Self-pay | Admitting: Cardiovascular Disease

## 2022-09-22 VITALS — BP 140/70 | HR 60 | Ht 70.0 in | Wt 154.2 lb

## 2022-09-22 DIAGNOSIS — I2510 Atherosclerotic heart disease of native coronary artery without angina pectoris: Secondary | ICD-10-CM

## 2022-09-22 DIAGNOSIS — E785 Hyperlipidemia, unspecified: Secondary | ICD-10-CM

## 2022-09-22 NOTE — Assessment & Plan Note (Signed)
History of CAD status post stents to his RCA and LAD back in 2015.  He had bypass grafting by Dr. [name] January 2016.  He underwent cardiac catheterization 07/19/2018 the setting of unstable angina by myself revealing a patent vein to a diagonal branch, patent vein to the marginal branch, atretic LIMA to the LAD and a patent stent to the native RCA which was not bypassed with a 70% distal RCA stenosis at the crux.  His LV function was normal.  I performed PCI and stenting of the left main into the LAD postdilated to 3.4 mm.  He has had no problems since and is very active.  He remains on dual antiplatelet therapy.

## 2022-09-22 NOTE — Patient Instructions (Signed)
Medication Instructions:  Your physician recommends that you continue on your current medications as directed. Please refer to the Current Medication list given to you today.  *If you need a refill on your cardiac medications before your next appointment, please call your pharmacy*   Follow-Up: At [facility], you and your health needs are our priority.  As part of our continuing mission to provide you with exceptional heart care, we have created designated Provider Care Teams.  These Care Teams include your primary Cardiologist (physician) and Advanced Practice Providers (APPs -  Physician Assistants and Nurse Practitioners) who all work together to provide you with the care you need, when you need it.  We recommend signing up for the patient portal called "MyChart".  Sign up information is provided on this After Visit Summary.  MyChart is used to connect with patients for Virtual Visits (Telemedicine).  Patients are able to view lab/test results, encounter notes, upcoming appointments, etc.  Non-urgent messages can be sent to your provider as well.   To learn more about what you can do with MyChart, go to [email or web address].    Your next appointment:   12 month(s)  Provider:   Jonathan Berry, MD    

## 2022-09-22 NOTE — Progress Notes (Signed)
09/22/2022 Michael Herrera   11/25/1950  [id number]  Primary Physician Orpah Melter, MD Primary Cardiologist: Lorretta Harp MD [name], [place]  HPI:  Michael Herrera is a 72 y.o. [name] fit appearing married Caucasian male father 2, grandfather of 4 grandchildren whose primary care provider is Dr. Christella Noa.  He is also cared for at the [facility] in [place].  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  I last saw him in the office on 08/12/2021.  His cardiac risk factors notable for treated hyperlipidemia but otherwise are negative.  He is never had a heart attack or stroke.  He did have stents to his RCA and LAD back in 2015 and bypass grafting by Dr. [name] January 2016.  He was admitted to [facility] with unstable angina 07/19/2018.  His enzymes were negative.  He underwent cardiac catheterization by myself 07/22/2018 revealing a patent vein to a diagonal, patent vein to the marginal branch, atretic LIMA to the LAD and a patent stent to the native RCA which was not bypassed with a 70% distal RCA stenosis at the crux.  His LV function was normal.  I performed PCI and drug-eluting stenting of he his left main into his LAD postdilated to 3.4 mm.  He has had no symptoms since and remains on dual antiplatelet therapy including aspirin and Plavix.  He walked 78 miles last week and typically walks 10 miles a day at 4.3 mph without symptoms.  His most recent lab work at the [facility] revealed a total cholesterol of less than 100 with an LDL in the 30 range.Since I saw him 6 months ago he continues to do well.  He exercises frequently and in large quantities and is completely asymptomatic denying chest pain or shortness of breath.   He was complaining of effort angina and dyspnea.  I performed diagnostic coronary angiography as an outpatient on 01/15/2020 revealing unchanged anatomy.  I performed Mynx closure  and stenting on the same day.    Since I saw him a year ago he continues to do well.  He walks approximately 10 miles a day and is completely asymptomatic.  He has had some problems with his left knee and recently had an MRI of this.   Current Meds  Medication Sig   acetaminophen (TYLENOL) 500 MG tablet Take 1,000 mg by mouth every 6 (six) hours as needed for headache.   Ascorbic Acid (VITAMIN C) 1000 MG tablet Take 1,000 mg by mouth every morning.    aspirin 81 MG tablet Take 81 mg by mouth every evening.    atorvastatin (LIPITOR) 80 MG tablet Take 1 tablet (80 mg total) by mouth every evening.   clopidogrel (PLAVIX) 75 MG tablet TAKE 1 TABLET BY MOUTH EVERY DAY   ezetimibe (ZETIA) 10 MG tablet Take 1 tablet (10 mg total) by mouth daily.   loratadine-pseudoephedrine (CLARITIN-D 24-HOUR) 10-240 MG 24 hr tablet Take 1 tablet by mouth daily as needed for allergies.   nitroGLYCERIN (NITROSTAT) 0.4 MG SL tablet Place 1 tablet (0.4 mg total) under the tongue every 5 (five) minutes as needed for chest pain.   Omega-3 Fatty Acids (FISH OIL PO) Take 1,400 mg by mouth every morning.    vitamin E 400 UNIT capsule Take 400 Units by mouth daily.   Current Facility-Administered Medications for the 09/22/22 encounter (Office Visit) with Quay Burow  J, MD  Medication   sodium chloride flush (NS) 0.9 % injection 3 mL     No Known Allergies  Social History   Socioeconomic History   Marital status: Married    Spouse name: Not on file   Number of children: Not on file   Years of education: Not on file   Highest education level: Not on file  Occupational History   Not on file  Tobacco Use   Smoking status: Never   Smokeless tobacco: Never  Substance and Sexual Activity   Alcohol use: Yes    Alcohol/week: 3.0 standard drinks of alcohol    Types: 3 Cans of beer per week    Comment: 1 beer 3 times weekly   Drug use: No   Sexual activity: Yes  Other Topics Concern   Not on file  Social  History Narrative   Not on file   Social Determinants of Health   Financial Resource Strain: Low Risk  (03/31/2022)   Overall Financial Resource Strain (CARDIA)    Difficulty of Paying Living Expenses: Not hard at all  Food Insecurity: No Food Insecurity (03/31/2022)   Hunger Vital Sign    Worried About Running Out of Food in the Last Year: Never true    Ran Out of Food in the Last Year: Never true  Transportation Needs: No Transportation Needs (03/31/2022)   PRAPARE - Hydrologist (Medical): No    Lack of Transportation (Non-Medical): No  Physical Activity: Sufficiently Active (03/31/2022)   Exercise Vital Sign    Days of Exercise per Week: 7 days    Minutes of Exercise per Session: 90 min  Stress: Not on file  Social Connections: Not on file  Intimate Partner Violence: Not on file     Review of Systems: General: negative for chills, fever, night sweats or weight changes.  Cardiovascular: negative for chest pain, dyspnea on exertion, edema, orthopnea, palpitations, paroxysmal nocturnal dyspnea or shortness of breath Dermatological: negative for rash Respiratory: negative for cough or wheezing Urologic: negative for hematuria Abdominal: negative for nausea, vomiting, diarrhea, bright red blood per rectum, melena, or hematemesis Neurologic: negative for visual changes, syncope, or dizziness All other systems reviewed and are otherwise negative except as noted above.    Blood pressure (!) 140/70, pulse 60, height 5' 10"$  (1.778 m), weight 154 lb 3.2 oz (69.9 kg), SpO2 100 %.  General appearance: alert and no distress Neck: no adenopathy, no carotid bruit, no JVD, supple, symmetrical, trachea midline, and thyroid not enlarged, symmetric, no tenderness/mass/nodules Lungs: clear to auscultation bilaterally Heart: regular rate and rhythm, S1, S2 normal, no murmur, click, rub or gallop Extremities: extremities normal, atraumatic, no cyanosis or edema Pulses:  2+ and symmetric Skin: Skin color, texture, turgor normal. No rashes or lesions Neurologic: Grossly normal  EKG sinus rhythm at 60 without ST or T wave changes.  I personally reviewed this EKG.  ASSESSMENT AND PLAN:   CAD (coronary artery disease) History of CAD status post stents to his RCA and LAD back in 2015.  He had bypass grafting by Dr. [name] January 2016.  He underwent cardiac catheterization 07/19/2018 the setting of unstable angina by myself revealing a patent vein to a diagonal branch, patent vein to the marginal branch, atretic LIMA to the LAD and a patent stent to the native RCA which was not bypassed with a 70% distal RCA stenosis at the crux.  His LV function was normal.  I performed PCI and  stenting of the left main into the LAD postdilated to 3.4 mm.  He has had no problems since and is very active.  He remains on dual antiplatelet therapy.  Dyslipidemia History of dyslipidemia on high-dose statin therapy with recent lab work performed by the [facility] 09/05/2022 revealing a total cholesterol of 98 with an LDL of 30 and HDL of 47.     Lorretta Harp MD FACP,FACC,FAHA, [facility] 09/22/2022 9:46 AM

## 2022-09-22 NOTE — Assessment & Plan Note (Signed)
History of dyslipidemia on high-dose statin therapy with recent lab work performed by the [facility] 09/05/2022 revealing a total cholesterol of 98 with an LDL of 30 and HDL of 47.

## 2022-10-02 DIAGNOSIS — Z85828 Personal history of other malignant neoplasm of skin: Secondary | ICD-10-CM | POA: Diagnosis not present

## 2022-10-02 DIAGNOSIS — C44319 Basal cell carcinoma of skin of other parts of face: Secondary | ICD-10-CM | POA: Diagnosis not present

## 2022-10-04 ENCOUNTER — Telehealth: Payer: Self-pay | Admitting: Cardiovascular Disease

## 2022-10-04 NOTE — Telephone Encounter (Signed)
Pt c/o medication issue:  1. Name of Medication:   clopidogrel (PLAVIX) 75 MG tablet   2. How are you currently taking this medication (dosage and times per day)?   As prescribed  3. Are you having a reaction (difficulty breathing--STAT)?  No  4. What is your medication issue?   Patient wants to stop taking this medication for 3 days as he had a basal cancer cells removed on Monday and he had some bleeding and his face is very bruised and swollen.  Patient stated he will start back the medication on Sunday.

## 2022-10-04 NOTE — Telephone Encounter (Signed)
Patient is aware he can stop the clopidgrel per his provider recommendations. He verbalized understanding.

## 2023-02-14 DIAGNOSIS — H25013 Cortical age-related cataract, bilateral: Secondary | ICD-10-CM | POA: Diagnosis not present

## 2023-02-14 DIAGNOSIS — H2513 Age-related nuclear cataract, bilateral: Secondary | ICD-10-CM | POA: Diagnosis not present

## 2023-02-19 DIAGNOSIS — D225 Melanocytic nevi of trunk: Secondary | ICD-10-CM | POA: Diagnosis not present

## 2023-02-19 DIAGNOSIS — L814 Other melanin hyperpigmentation: Secondary | ICD-10-CM | POA: Diagnosis not present

## 2023-02-19 DIAGNOSIS — D692 Other nonthrombocytopenic purpura: Secondary | ICD-10-CM | POA: Diagnosis not present

## 2023-02-19 DIAGNOSIS — Z85828 Personal history of other malignant neoplasm of skin: Secondary | ICD-10-CM | POA: Diagnosis not present

## 2023-02-19 DIAGNOSIS — D0472 Carcinoma in situ of skin of left lower limb, including hip: Secondary | ICD-10-CM | POA: Diagnosis not present

## 2023-02-19 DIAGNOSIS — L570 Actinic keratosis: Secondary | ICD-10-CM | POA: Diagnosis not present

## 2023-02-19 DIAGNOSIS — L821 Other seborrheic keratosis: Secondary | ICD-10-CM | POA: Diagnosis not present

## 2023-06-25 LAB — LAB REPORT - SCANNED
A1c: 5.8
EGFR: 77

## 2023-09-25 ENCOUNTER — Ambulatory Visit: Payer: PPO | Attending: Cardiovascular Disease | Admitting: Cardiovascular Disease

## 2023-09-25 ENCOUNTER — Encounter: Payer: Self-pay | Admitting: Cardiovascular Disease

## 2023-09-25 VITALS — BP 154/73 | HR 69 | Ht 70.0 in | Wt 148.2 lb

## 2023-09-25 DIAGNOSIS — I2510 Atherosclerotic heart disease of native coronary artery without angina pectoris: Secondary | ICD-10-CM | POA: Diagnosis not present

## 2023-09-25 DIAGNOSIS — E785 Hyperlipidemia, unspecified: Secondary | ICD-10-CM

## 2023-09-25 NOTE — Assessment & Plan Note (Signed)
 History of CAD status post remote stents of his LAD and RCA back in 2015 and ultimately had bypass grafting by Dr. Dorris Fetch in 2016.  I catheterized him 07/22/2018 revealing patent vein to diagonal branch, patent vein to the marginal branch, an atretic LIMA to the LAD and a patent stent to the native right which was not bypassed as well as a 70% distal RCA stenosis at the crux.  I performed PCI drug-eluting stenting of his left main into his LAD postdilated to 3.4 mm.  I restudied him 01/15/2020 revealing unchanged anatomy.  He is active and asymptomatic.

## 2023-09-25 NOTE — Assessment & Plan Note (Signed)
 History of dyslipidemia on high-dose atorvastatin with lipid profile performed at the [facility] 06/26/2023 revealing a total cholesterol of 106, LDL 44 and HDL of 38.

## 2023-09-25 NOTE — Progress Notes (Signed)
 09/25/2023 Michael Herrera   12-Nov-1950  [id number]  Primary Physician Joycelyn Rua, MD Primary Cardiologist: Runell Gess MD Nicholes Calamity, [place]  HPI:  Michael Herrera is a 73 y.o.   fit appearing married Caucasian male father 2, grandfather of 4 grandchildren whose primary care provider is Dr. Silvestre Moment.  He is also cared for at the [facility] in [place].  He is retired 12 years working in the [employer] and and was an EMT for 37 years.  I last saw him in the office on 10/21/2022.  His cardiac risk factors notable for treated hyperlipidemia but otherwise are negative.  He is never had a heart attack or stroke.  He did have stents to his RCA and LAD back in 2015 and bypass grafting by Dr. Dorris Fetch January 2016.  He was admitted to [facility] with unstable angina 07/19/2018.  His enzymes were negative.  He underwent cardiac catheterization by myself 07/22/2018 revealing a patent vein to a diagonal, patent vein to the marginal branch, atretic LIMA to the LAD and a patent stent to the native RCA which was not bypassed with a 70% distal RCA stenosis at the crux.  His LV function was normal.  I performed PCI and drug-eluting stenting of he his left main into his LAD postdilated to 3.4 mm.  He has had no symptoms since and remains on dual antiplatelet therapy including aspirin and Plavix.  He walked 78 miles last week and typically walks 10 miles a day at 4.3 mph without symptoms.  His most recent lab work at the [facility] revealed a total cholesterol of less than 100 with an LDL in the 30 range.Since I saw him 6 months ago he continues to do well.  He exercises frequently and in large quantities and is completely asymptomatic denying chest pain or shortness of breath.   He was complaining of effort angina and dyspnea.  I performed diagnostic coronary angiography as an outpatient on 01/15/2020 revealing unchanged anatomy.  I performed Mynx closure  and stenting on the same day.    Since I saw him a year ago he continues to do well.  He walks approximately 7 miles a day and is completely asymptomatic.  His most recent lipid profile performed to the [facility] 06/26/2023 revealed a total cholesterol 106, LDL 44 and HDL 38.   No outpatient medications have been marked as taking for the 09/25/23 encounter (Office Visit) with Runell Gess, MD.   Current Facility-Administered Medications for the 09/25/23 encounter (Office Visit) with Runell Gess, MD  Medication   sodium chloride flush (NS) 0.9 % injection 3 mL     No Known Allergies  Social History   Socioeconomic History   Marital status: Married    Spouse name: Not on file   Number of children: Not on file   Years of education: Not on file   Highest education level: Not on file  Occupational History   Not on file  Tobacco Use   Smoking status: Never   Smokeless tobacco: Never  Substance and Sexual Activity   Alcohol use: Yes    Alcohol/week: 3.0 standard drinks of alcohol    Types: 3 Cans of beer per week    Comment: 1 beer 3 times weekly   Drug use: No   Sexual activity: Yes    Partners: Female    Comment: married  Other Topics Concern   Not  on file  Social History Narrative   Not on file   Social Drivers of Health   Financial Resource Strain: Low Risk  (03/31/2022)   Overall Financial Resource Strain (CARDIA)    Difficulty of Paying Living Expenses: Not hard at all  Food Insecurity: No Food Insecurity (03/31/2022)   Hunger Vital Sign    Worried About Running Out of Food in the Last Year: Never true    Ran Out of Food in the Last Year: Never true  Transportation Needs: No Transportation Needs (03/31/2022)   PRAPARE - Administrator, Civil Service (Medical): No    Lack of Transportation (Non-Medical): No  Physical Activity: Sufficiently Active (03/31/2022)   Exercise Vital Sign    Days of Exercise per Week: 7 days    Minutes of  Exercise per Session: 90 min  Stress: Not on file  Social Connections: Not on file  Intimate Partner Violence: Not on file     Review of Systems: General: negative for chills, fever, night sweats or weight changes.  Cardiovascular: negative for chest pain, dyspnea on exertion, edema, orthopnea, palpitations, paroxysmal nocturnal dyspnea or shortness of breath Dermatological: negative for rash Respiratory: negative for cough or wheezing Urologic: negative for hematuria Abdominal: negative for nausea, vomiting, diarrhea, bright red blood per rectum, melena, or hematemesis Neurologic: negative for visual changes, syncope, or dizziness All other systems reviewed and are otherwise negative except as noted above.    Blood pressure (!) 154/73, pulse 69, height 5\' 10"  (1.778 m), weight 148 lb 3.2 oz (67.2 kg), SpO2 100%.  General appearance: alert and no distress Neck: no adenopathy, no JVD, supple, symmetrical, trachea midline, thyroid not enlarged, symmetric, no tenderness/mass/nodules, and soft right carotid Michael Herrera Lungs: clear to auscultation bilaterally Heart: regular rate and rhythm, S1, S2 normal, no murmur, click, rub or gallop Extremities: extremities normal, atraumatic, no cyanosis or edema Pulses: 2+ and symmetric Skin: Skin color, texture, turgor normal. No rashes or lesions Neurologic: Grossly normal  EKG EKG Interpretation Date/Time:  Tuesday September 25 2023 09:47:08 EST Ventricular Rate:  69 PR Interval:  130 QRS Duration:  92 QT Interval:  370 QTC Calculation: 396 R Axis:   69  Text Interpretation: Normal sinus rhythm Normal ECG When compared with ECG of 23-Jul-2018 06:12, No significant change was found Confirmed by Nanetta Batty [phone]) on 09/25/2023 10:48:34 AM    ASSESSMENT AND PLAN:   CAD (coronary artery disease) History of CAD status post remote stents of his LAD and RCA back in 2015 and ultimately had bypass grafting by Dr. Dorris Fetch in 2016.  I  catheterized him 07/22/2018 revealing patent vein to diagonal branch, patent vein to the marginal branch, an atretic LIMA to the LAD and a patent stent to the native right which was not bypassed as well as a 70% distal RCA stenosis at the crux.  I performed PCI drug-eluting stenting of his left main into his LAD postdilated to 3.4 mm.  I restudied him 01/15/2020 revealing unchanged anatomy.  He is active and asymptomatic.  Dyslipidemia History of dyslipidemia on high-dose atorvastatin with lipid profile performed at the [facility] 06/26/2023 revealing a total cholesterol of 106, LDL 44 and HDL of 38.     Runell Gess MD FACP,FACC,FAHA, [facility] 09/25/2023 10:55 AM

## 2023-09-25 NOTE — Patient Instructions (Signed)

## 2024-02-13 DIAGNOSIS — H25013 Cortical age-related cataract, bilateral: Secondary | ICD-10-CM | POA: Diagnosis not present

## 2024-02-13 DIAGNOSIS — H2513 Age-related nuclear cataract, bilateral: Secondary | ICD-10-CM | POA: Diagnosis not present

## 2024-03-17 DIAGNOSIS — D225 Melanocytic nevi of trunk: Secondary | ICD-10-CM | POA: Diagnosis not present

## 2024-03-17 DIAGNOSIS — Z85828 Personal history of other malignant neoplasm of skin: Secondary | ICD-10-CM | POA: Diagnosis not present

## 2024-03-17 DIAGNOSIS — L821 Other seborrheic keratosis: Secondary | ICD-10-CM | POA: Diagnosis not present

## 2024-03-17 DIAGNOSIS — D1801 Hemangioma of skin and subcutaneous tissue: Secondary | ICD-10-CM | POA: Diagnosis not present

## 2024-03-17 DIAGNOSIS — C44629 Squamous cell carcinoma of skin of left upper limb, including shoulder: Secondary | ICD-10-CM | POA: Diagnosis not present

## 2024-03-17 DIAGNOSIS — L814 Other melanin hyperpigmentation: Secondary | ICD-10-CM | POA: Diagnosis not present

## 2024-03-17 DIAGNOSIS — L570 Actinic keratosis: Secondary | ICD-10-CM | POA: Diagnosis not present

## 2024-08-18 ENCOUNTER — Encounter: Payer: Self-pay | Admitting: Cardiovascular Disease

## 2024-09-29 ENCOUNTER — Ambulatory Visit: Admitting: Cardiovascular Disease
